# Patient Record
Sex: Female | Born: 1945
Health system: Southern US, Community
[De-identification: ages and names within clinical notes are randomized; demographics above are authoritative.]

## PROBLEM LIST (undated history)

## (undated) DIAGNOSIS — E039 Hypothyroidism, unspecified: Secondary | ICD-10-CM

## (undated) DIAGNOSIS — E079 Disorder of thyroid, unspecified: Secondary | ICD-10-CM

## (undated) DIAGNOSIS — R918 Other nonspecific abnormal finding of lung field: Secondary | ICD-10-CM

## (undated) DIAGNOSIS — R0600 Dyspnea, unspecified: Secondary | ICD-10-CM

## (undated) DIAGNOSIS — R519 Headache, unspecified: Secondary | ICD-10-CM

## (undated) HISTORY — PX: OTHER SURGICAL HISTORY: SHX169

## (undated) HISTORY — PX: WISDOM TOOTH EXTRACTION: SHX21

## (undated) HISTORY — PX: CATARACT EXTRACTION, BILATERAL: SHX1313

## (undated) HISTORY — PX: COLONOSCOPY: SHX174

## (undated) HISTORY — PX: SHOULDER SURGERY: SHX246

---

## 2002-06-22 ENCOUNTER — Encounter: Payer: Self-pay | Admitting: Emergency Medicine

## 2002-06-22 ENCOUNTER — Inpatient Hospital Stay (HOSPITAL_COMMUNITY): Admission: EM | Admit: 2002-06-22 | Discharge: 2002-06-23 | Payer: Self-pay

## 2002-06-22 ENCOUNTER — Encounter (INDEPENDENT_AMBULATORY_CARE_PROVIDER_SITE_OTHER): Payer: Self-pay | Admitting: *Deleted

## 2002-06-23 ENCOUNTER — Encounter: Payer: Self-pay | Admitting: Cardiology

## 2002-12-14 ENCOUNTER — Encounter (INDEPENDENT_AMBULATORY_CARE_PROVIDER_SITE_OTHER): Payer: Self-pay | Admitting: Specialist

## 2002-12-14 ENCOUNTER — Ambulatory Visit (HOSPITAL_COMMUNITY): Admission: RE | Admit: 2002-12-14 | Discharge: 2002-12-14 | Payer: Self-pay | Admitting: Gastroenterology

## 2010-02-22 ENCOUNTER — Encounter
Admission: RE | Admit: 2010-02-22 | Discharge: 2010-02-22 | Payer: Self-pay | Source: Home / Self Care | Attending: Otolaryngology | Admitting: Otolaryngology

## 2010-03-07 LAB — TSH: TSH: 6.051 u[IU]/mL — ABNORMAL HIGH (ref 0.350–4.500)

## 2010-03-07 LAB — CBC
MCV: 95 fL (ref 78.0–100.0)
Platelets: 217 10*3/uL (ref 150–400)
RDW: 13.1 % (ref 11.5–15.5)
WBC: 7.2 10*3/uL (ref 4.0–10.5)

## 2010-03-07 LAB — SURGICAL PCR SCREEN: MRSA, PCR: NEGATIVE

## 2010-03-09 ENCOUNTER — Ambulatory Visit (HOSPITAL_COMMUNITY)
Admission: RE | Admit: 2010-03-09 | Discharge: 2010-03-09 | Payer: Self-pay | Source: Home / Self Care | Attending: Otolaryngology | Admitting: Otolaryngology

## 2010-03-12 NOTE — Op Note (Signed)
[name], [name]              ACCOUNT NO.:  [id number]  MEDICAL RECORD NO.:  [id number]          PATIENT TYPE:  AMB  LOCATION:  SDS                          FACILITY:  [facility]  PHYSICIAN:  Cristle Jared T. Lazarus Salines, M.D. DATE OF BIRTH:  Aug 05, 1945  DATE OF PROCEDURE:  03/09/2010 DATE OF DISCHARGE:  03/09/2010                              OPERATIVE REPORT   PREOPERATIVE DIAGNOSIS:  Left vocal cord lesion.  Bilateral vocal cord polyps.  POSTOPERATIVE DIAGNOSIS:  Complex left vocal cord polyp.  Right vocal cord polyp.  PROCEDURE PERFORMED:  Microlaryngoscopy with bilateral vocal cord stripping.  SURGEON:  Gloris Manchester. Lazarus Salines, MD  ANESTHESIA:  General orotracheal.  BLOOD LOSS:  None.  COMPLICATIONS:  None.  FINDINGS:  A multilobulated polyp of the left vocal cord, more pedunculated than typical and with a yellowish-white opaque area towards the posterior, which was felt to be possible neoplasm on office examination.  The right vocal cord, and more typical fusiform polyp along the length of the vocal cord.  PROCEDURE:  With the patient in the comfortable supine position, general orotracheal anesthesia was induced without difficulty.  At an appropriate level, the table was turned 90 degrees and the patient placed in a slight reverse Trendelenburg.  A rubber tooth guard was applied.  Taking care to protect lips, teeth, and endotracheal tube, the Dedo laryngoscope was introduced, passed into the glottis with good visualization and suspended in the standard fashion.  The findings were as described above.  It was difficult to see below the cords because of the swollen polyps occupying the glottic space.  A 1.5 x 3-inch cottonoid soaked in 1:1000 epinephrine was placed against the cords for intraoperative hemostasis and decongestion.  This was allowed to remain in position for several minutes.  Upon removal, a photograph was taken using the 0 degrees rod bronchoscope.  Beginning on the  left side, the lesion was manipulated with a suction to determine its attachment.  The limiting incisions were made posteriorly behind the visible lesion and just lateral/posterior to the anterior commissure.  Incision was made along the superior surface of the vocal cord with an up-scissors and the lesion was gently dissected using the scissors away from the vocal ligament.  Finally, it was removed in its entirety and sent for frozen section pathologic interpretation.  Given the benign appearance of the lesion, and the plan to remove the right vocal cord and polyp in any event, attention was turned to the right vocal cord.  Once again, the limiting incisions were made anterior and posterior to protect the anterior commissure.  Using a sickle knife and also an up-scissors, the polyp was dissected away from the vocal ligament and passed off as specimen.  A good configuration of the vocal ligament on both sides was observed and the mucosa of the anterior commissure was protected.  There was essentially no bleeding.  At this point, the cottonoid soaked in 1:1000 epinephrine was again placed against the raw surfaces and the laryngoscope was relaxed.  The frozen section returned as a benign inflammatory polyp.  No further attention was felt required.  The laryngoscope was resuspended, the  cottonoid was removed, and a photograph was taken with the rod bronchoscope once again.  The bronchoscope was removed.  The laryngoscope scope was un-suspended and removed under direct vision.  There was no evidence of any additional lesions behind the endotracheal tube.  At this point, the procedure was completed and the patient was returned to Anesthesia.  The tooth guard was removed.  She was awakened and extubated and transferred to recovery in stable condition.  COMMENT:  A 65 year old white female with a long smoking and reflux history with persistent polyps and hoarseness, was noted to  have worsening hoarseness following a recent intubation and on office examination, had a more complex-appearing left vocal cord lesion suspicious for neoplasm, hence the indication for today's procedure. Anticipate a routine postoperative recovery with analgesia, elevation, ice, and voice rest.  Given low anticipated risk of postanesthetic or postsurgical complications, feel an outpatient venue is appropriate.  I will see her back in my office in 2-3 weeks.  I will call them with the pathology report.     Gloris Manchester. Lazarus Salines, M.D.     KTW/MEDQ  D:  03/09/2010  T:  03/10/2010  Job:  [id number]  cc:   [facility]  Electronically Signed by Flo Shanks M.D. on 03/12/2010 05:26:47 PM

## 2010-06-28 ENCOUNTER — Ambulatory Visit: Payer: Medicare Other | Attending: Otolaryngology

## 2010-06-28 DIAGNOSIS — R490 Dysphonia: Secondary | ICD-10-CM | POA: Insufficient documentation

## 2010-06-28 DIAGNOSIS — IMO0001 Reserved for inherently not codable concepts without codable children: Secondary | ICD-10-CM | POA: Insufficient documentation

## 2010-06-29 NOTE — Op Note (Signed)
   NAME:  [name], [name]                        ACCOUNT NO.:  [id number]   MEDICAL RECORD NO.:  [id number]                   PATIENT TYPE:  AMB   LOCATION:  ENDO                                 FACILITY:  [facility]   PHYSICIAN:  Graylin Shiver, M.D.                DATE OF BIRTH:  Jul 03, 1945   DATE OF PROCEDURE:  12/14/2002  DATE OF DISCHARGE:                                 OPERATIVE REPORT   PROCEDURE:  Colonoscopy.   INDICATIONS FOR PROCEDURE:  Screening. Informed consent was obtained after  the explanation of bleeding, infection and perforation.   PREMEDICATIONS:  Fentanyl 75 micrograms IV, Versed 6 mg IV.   DESCRIPTION OF PROCEDURE:  With the patient in the left lateral decubitus  position a rectal examination was performed and no masses were felt. The  Olympus colonoscope was inserted into the rectum and advanced  around the  colon to the cecum.   The cecal landmarks were identified. The cecum and ascending colon were  normal. The transverse colon was normal. The descending colon and sigmoid  were normal. In the distal  rectum there was a small, 3-mm sessile polyp,  removed with cold forceps.  The patient tolerated the procedure well without  complications.   IMPRESSION:  Small distal rectal polyp.   PLAN:  The pathology will be checked.                                               Graylin Shiver, M.D.    SFG/MEDQ  D:  12/14/2002  T:  12/14/2002  Job:  [id number]   cc:   Dellis Anes. Idell Pickles, M.D.  [address]  [place]  [place] [address]  Fax: [phone]

## 2010-07-03 ENCOUNTER — Ambulatory Visit: Payer: Medicare Other

## 2010-07-05 ENCOUNTER — Ambulatory Visit: Payer: Medicare Other

## 2010-07-10 ENCOUNTER — Ambulatory Visit: Payer: Medicare Other

## 2010-07-12 ENCOUNTER — Ambulatory Visit: Payer: Medicare Other

## 2010-07-16 ENCOUNTER — Ambulatory Visit: Payer: Medicare Other | Attending: Otolaryngology

## 2010-07-16 DIAGNOSIS — IMO0001 Reserved for inherently not codable concepts without codable children: Secondary | ICD-10-CM | POA: Insufficient documentation

## 2010-07-16 DIAGNOSIS — R490 Dysphonia: Secondary | ICD-10-CM | POA: Insufficient documentation

## 2010-07-19 ENCOUNTER — Ambulatory Visit: Payer: Medicare Other

## 2010-07-24 ENCOUNTER — Ambulatory Visit: Payer: Medicare Other

## 2010-07-26 ENCOUNTER — Ambulatory Visit: Payer: Medicare Other

## 2010-09-11 ENCOUNTER — Emergency Department (INDEPENDENT_AMBULATORY_CARE_PROVIDER_SITE_OTHER): Payer: Medicare Other

## 2010-09-11 ENCOUNTER — Emergency Department (HOSPITAL_BASED_OUTPATIENT_CLINIC_OR_DEPARTMENT_OTHER)
Admission: EM | Admit: 2010-09-11 | Discharge: 2010-09-11 | Disposition: A | Discharge status: Home / Self Care | Payer: Medicare Other | Attending: Emergency Medicine | Admitting: Emergency Medicine

## 2010-09-11 DIAGNOSIS — W19XXXA Unspecified fall, initial encounter: Secondary | ICD-10-CM | POA: Insufficient documentation

## 2010-09-11 DIAGNOSIS — S52599A Other fractures of lower end of unspecified radius, initial encounter for closed fracture: Secondary | ICD-10-CM

## 2010-09-11 DIAGNOSIS — E079 Disorder of thyroid, unspecified: Secondary | ICD-10-CM | POA: Insufficient documentation

## 2010-09-11 HISTORY — DX: Disorder of thyroid, unspecified: E07.9

## 2010-09-11 NOTE — ED Notes (Signed)
Pt states she fell-landed on right wrist

## 2010-09-11 NOTE — ED Provider Notes (Signed)
History     Chief Complaint  Patient presents with  . Wrist Injury   HPI History of Present Illness   Patient Identification [name] is a 65 y.o. female.  Patient information was obtained from patient. History/Exam limitations: none. Patient presented to the Emergency Department by private vehicle.  Chief Complaint  Wrist Injury   The patient complains of pain in the right wrist pain after a fall. Onset of symptoms was abrupt starting 1 hour ago. There is is not a history of a previous shoulder injury. Patient describes pain as sharp/stabbing. Pain severity at onset was moderate. The pain does not radiate. Pain is aggravated by movement. Pain is alleviated by immobilization. Symptoms associated with pain include none. The patient denies other injuries. Care prior to arrival consisted of nothing, with no relief.  Past Medical History  Diagnosis Date  . Thyroid condition    History reviewed. No pertinent family history. No current facility-administered medications for this encounter.   Current Outpatient Prescriptions  Medication Sig Dispense Refill  . brimonidine (ALPHAGAN P) 0.1 % SOLN Place 1 drop into both eyes 2 (two) times daily.        . Cholecalciferol (VITAMIN D) 1000 UNITS capsule Take 1,000 Units by mouth daily.        Marland Kitchen levothyroxine (SYNTHROID, LEVOTHROID) 75 MCG tablet Take 75 mcg by mouth daily.        . travoprost, benzalkonium, (TRAVATAN) 0.004 % ophthalmic solution Place 1 drop into both eyes at bedtime.         Allergies  Allergen Reactions  . Bee Swelling  . Timolol Nausea Only  . Hydrocodone Itching and Rash   History   Social History  . Marital Status: Married    Spouse Name: N/A    Number of Children: N/A  . Years of Education: N/A   Occupational History  . Not on file.   Social History Main Topics  . Smoking status: Former Games developer  . Smokeless tobacco: Not on file  . Alcohol Use: Yes  . Drug Use:   . Sexually Active:    Other  Topics Concern  . Not on file   Social History Narrative  . No narrative on file   Review of Systems Pertinent items are noted in HPI.   Physical Exam   BP 148/97  Pulse 71  Temp(Src) 98 F (36.7 C) (Oral)  Resp 18  SpO2 98% General appearance: alert, cooperative and appears stated age   ED Course   Studies: wrist x-Khloe Hunkele  Records Reviewed: Nursing notes.  Treatments:   Consultations: Orthopedic Surgery consulted. Treatment options were discussed and plan of care agreed upon.  Disposition: Home to follow up with ortho  Past Medical History  Diagnosis Date  . Thyroid condition     Past Surgical History  Procedure Date  . Vocal cord polyp removal     History reviewed. No pertinent family history.  History  Substance Use Topics  . Smoking status: Former Games developer  . Smokeless tobacco: Not on file  . Alcohol Use: Yes    OB History    Grav Para Term Preterm Abortions TAB SAB Ect Mult Living                  Review of Systems  Physical Exam  BP 148/97  Pulse 71  Temp(Src) 98 F (36.7 C) (Oral)  Resp 18  SpO2 98%  Physical Exam  Nursing note and vitals reviewed. Constitutional: She appears well-nourished.  HENT:  Head: Atraumatic.  Neck: Normal range of motion.  Musculoskeletal: Normal range of motion.       Right wrist: She exhibits tenderness, bony tenderness and swelling. She exhibits no effusion, no crepitus, no deformity and no laceration.       Arms:   ED Course  Procedures  MDM        Hilario Quarry, MD 09/14/10 [phone]

## 2010-10-10 ENCOUNTER — Emergency Department (INDEPENDENT_AMBULATORY_CARE_PROVIDER_SITE_OTHER): Payer: Medicare Other

## 2010-10-10 ENCOUNTER — Emergency Department (HOSPITAL_BASED_OUTPATIENT_CLINIC_OR_DEPARTMENT_OTHER)
Admission: EM | Admit: 2010-10-10 | Discharge: 2010-10-10 | Disposition: A | Discharge status: Home / Self Care | Payer: Medicare Other | Attending: Emergency Medicine | Admitting: Emergency Medicine

## 2010-10-10 ENCOUNTER — Encounter (HOSPITAL_BASED_OUTPATIENT_CLINIC_OR_DEPARTMENT_OTHER): Payer: Self-pay

## 2010-10-10 DIAGNOSIS — R609 Edema, unspecified: Secondary | ICD-10-CM

## 2010-10-10 DIAGNOSIS — W19XXXA Unspecified fall, initial encounter: Secondary | ICD-10-CM

## 2010-10-10 DIAGNOSIS — S82899A Other fracture of unspecified lower leg, initial encounter for closed fracture: Secondary | ICD-10-CM | POA: Insufficient documentation

## 2010-10-10 DIAGNOSIS — M25579 Pain in unspecified ankle and joints of unspecified foot: Secondary | ICD-10-CM

## 2010-10-10 DIAGNOSIS — S82409A Unspecified fracture of shaft of unspecified fibula, initial encounter for closed fracture: Secondary | ICD-10-CM

## 2010-10-10 DIAGNOSIS — Y92009 Unspecified place in unspecified non-institutional (private) residence as the place of occurrence of the external cause: Secondary | ICD-10-CM | POA: Insufficient documentation

## 2010-10-10 NOTE — ED Notes (Signed)
Slipped on deck this am and pain right ankle with swelling

## 2010-10-10 NOTE — ED Provider Notes (Signed)
History     CSN: [id number] Arrival date & time: 10/10/2010 10:46 AM  Chief Complaint  Patient presents with  . Ankle Pain   Patient is a 65 y.o. female presenting with ankle pain.  Ankle Pain  The incident occurred 3 to 5 hours ago. The incident occurred at home. The injury mechanism was a fall. The pain is present in the right ankle. The quality of the pain is described as aching and throbbing. The pain is at a severity of 5/10. The pain is moderate. The pain has been constant since onset. Associated symptoms include inability to bear weight, loss of motion and tingling. She reports no foreign bodies present. The symptoms are aggravated by nothing. She has tried elevation and ice for the symptoms. The treatment provided mild relief.    Past Medical History  Diagnosis Date  . Thyroid condition   . Glaucoma     Past Surgical History  Procedure Date  . Vocal cord polyp removal   . Shoulder surgery     No family history on file.  History  Substance Use Topics  . Smoking status: Former Games developer  . Smokeless tobacco: Not on file  . Alcohol Use: 0.6 oz/week    1 Glasses of wine per week     per day    OB History    Grav Para Term Preterm Abortions TAB SAB Ect Mult Living                  Review of Systems  Neurological: Positive for tingling.  All other systems reviewed and are negative.    Physical Exam  BP 140/55  Pulse 68  Temp(Src) 98.3 F (36.8 C) (Oral)  Resp 18  SpO2 97%  Physical Exam  Nursing note and vitals reviewed. Constitutional: She is oriented to person, place, and time. She appears well-developed and well-nourished.  HENT:  Head: Normocephalic.  Eyes: Pupils are equal, round, and reactive to light.  Neck: Normal range of motion.  Musculoskeletal: She exhibits edema and tenderness.  Neurological: She is alert and oriented to person, place, and time.  Skin: Skin is warm.  Psychiatric: She has a normal mood and affect.    ED Course    Procedures  MDM   Crutches,  Cam walker,       Langston Masker, [place] 10/10/10 1227

## 2010-10-10 NOTE — ED Provider Notes (Signed)
Evaluation and management procedures were performed by the mid-level provider (PA/NP/CNM) under my supervision/collaboration. I was present and available during the ED course. GHIM,MICHAEL Y.   Michael Y. Ghim, MD 10/10/10 1248 

## 2012-09-09 ENCOUNTER — Emergency Department (HOSPITAL_BASED_OUTPATIENT_CLINIC_OR_DEPARTMENT_OTHER): Payer: Medicare Other

## 2012-09-09 ENCOUNTER — Emergency Department (HOSPITAL_BASED_OUTPATIENT_CLINIC_OR_DEPARTMENT_OTHER)
Admission: EM | Admit: 2012-09-09 | Discharge: 2012-09-09 | Disposition: A | Discharge status: Home / Self Care | Payer: Medicare Other | Attending: Emergency Medicine | Admitting: Emergency Medicine

## 2012-09-09 ENCOUNTER — Encounter (HOSPITAL_BASED_OUTPATIENT_CLINIC_OR_DEPARTMENT_OTHER): Payer: Self-pay | Admitting: *Deleted

## 2012-09-09 DIAGNOSIS — Y9389 Activity, other specified: Secondary | ICD-10-CM | POA: Insufficient documentation

## 2012-09-09 DIAGNOSIS — Z87891 Personal history of nicotine dependence: Secondary | ICD-10-CM | POA: Insufficient documentation

## 2012-09-09 DIAGNOSIS — S93402A Sprain of unspecified ligament of left ankle, initial encounter: Secondary | ICD-10-CM

## 2012-09-09 DIAGNOSIS — Y929 Unspecified place or not applicable: Secondary | ICD-10-CM | POA: Insufficient documentation

## 2012-09-09 DIAGNOSIS — H40019 Open angle with borderline findings, low risk, unspecified eye: Secondary | ICD-10-CM | POA: Insufficient documentation

## 2012-09-09 DIAGNOSIS — X500XXA Overexertion from strenuous movement or load, initial encounter: Secondary | ICD-10-CM | POA: Insufficient documentation

## 2012-09-09 DIAGNOSIS — M25473 Effusion, unspecified ankle: Secondary | ICD-10-CM | POA: Insufficient documentation

## 2012-09-09 DIAGNOSIS — M2560 Stiffness of unspecified joint, not elsewhere classified: Secondary | ICD-10-CM | POA: Insufficient documentation

## 2012-09-09 DIAGNOSIS — E079 Disorder of thyroid, unspecified: Secondary | ICD-10-CM | POA: Insufficient documentation

## 2012-09-09 DIAGNOSIS — M25476 Effusion, unspecified foot: Secondary | ICD-10-CM | POA: Insufficient documentation

## 2012-09-09 DIAGNOSIS — S93409A Sprain of unspecified ligament of unspecified ankle, initial encounter: Secondary | ICD-10-CM | POA: Insufficient documentation

## 2012-09-09 DIAGNOSIS — Z79899 Other long term (current) drug therapy: Secondary | ICD-10-CM | POA: Insufficient documentation

## 2012-09-09 NOTE — ED Notes (Signed)
Pt c/o left ankle injury x 2 days ago pain and swelling started yesterday

## 2012-09-09 NOTE — ED Provider Notes (Addendum)
CSN: [id number]     Arrival date & time 09/09/12  1743 History     First MD Initiated Contact with Patient 09/09/12 1759     Chief Complaint  Patient presents with  . Ankle Pain   (Consider location/radiation/quality/duration/timing/severity/associated sxs/prior Treatment) Patient is a 67 y.o. female presenting with ankle pain. The history is provided by the patient.  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 she remembers twisting and on Monday but just kept going and then yesterday noticed pain and swelling in the left ankle which got worse today Ankle location:  L ankle Pain details:    Quality:  Aching and sharp   Radiates to:  Does not radiate   Severity:  Moderate   Onset quality:  Gradual   Duration:  2 days   Timing:  Constant   Progression:  Worsening Chronicity:  New Prior injury to area:  No Relieved by:  Ice, rest and elevation Worsened by:  Bearing weight Associated symptoms: stiffness and swelling   Associated symptoms: no decreased ROM and no tingling     Past Medical History  Diagnosis Date  . Thyroid condition   . Glaucoma    Past Surgical History  Procedure Laterality Date  . Vocal cord polyp removal    . Shoulder surgery     History reviewed. No pertinent family history. History  Substance Use Topics  . Smoking status: Former Games developer  . Smokeless tobacco: Not on file  . Alcohol Use: 0.6 oz/week    1 Glasses of wine per week     Comment: per day   OB History   Grav Para Term Preterm Abortions TAB SAB Ect Mult Living                 Review of Systems  Musculoskeletal: Positive for stiffness.  All other systems reviewed and are negative.    Allergies  Nutritional supplements; Timolol; and Hydrocodone  Home Medications   Current Outpatient Rx  Name  Route  Sig  Dispense  Refill  . brimonidine (ALPHAGAN P) 0.1 % SOLN   Both Eyes   Place 1 drop into  both eyes 2 (two) times daily.           . Cholecalciferol (VITAMIN D) 1000 UNITS capsule   Oral   Take 1,000 Units by mouth daily.           Marland Kitchen levothyroxine (SYNTHROID, LEVOTHROID) 75 MCG tablet   Oral   Take 75 mcg by mouth daily.           . travoprost, benzalkonium, (TRAVATAN) 0.004 % ophthalmic solution   Both Eyes   Place 1 drop into both eyes at bedtime.            BP 141/77  Pulse 86  Temp(Src) 98.6 F (37 C) (Oral)  Resp 16  Ht 4' 11.5" (1.511 m)  Wt 125 lb (56.7 kg)  BMI 24.83 kg/m2  SpO2 100% Physical Exam  Nursing note and vitals reviewed. Constitutional: She is oriented to person, place, and time. She appears well-developed and well-nourished. No distress.  HENT:  Head: Normocephalic and atraumatic.  Mouth/Throat: Oropharynx is clear and moist.  Eyes: Conjunctivae and EOM are normal. Pupils are equal, round, and reactive to light.  Neck: Normal range of motion. Neck supple.  Cardiovascular: Normal rate and intact distal pulses.   Pulmonary/Chest: Effort normal and breath sounds normal.  Abdominal: Soft.  Musculoskeletal: Normal range of motion. She exhibits no edema and no tenderness.       Left ankle: She exhibits swelling. She exhibits normal range of motion, no ecchymosis and no deformity. Tenderness. Lateral malleolus tenderness found. No head of 5th metatarsal and no proximal fibula tenderness found.  Neurological: She is alert and oriented to person, place, and time.  Skin: Skin is warm and dry. No rash noted. No erythema.  Psychiatric: She has a normal mood and affect. Her behavior is normal.    ED Course   Procedures (including critical care time)  Labs Reviewed - No data to display Dg Ankle Complete Left  09/09/2012   *RADIOLOGY REPORT*  Clinical Data: Pain and swelling.  LEFT ANKLE COMPLETE - 3+ VIEW  Comparison: None.  Findings: Ankle mortise intact.  Small calcaneal spur at the plantar aponeurosis. Negative for fracture, dislocation, or  other acute abnormality.  Normal alignment and mineralization. No significant degenerative change.  Regional soft tissues unremarkable.  IMPRESSION:  Negative   Original Report Authenticated By: D. Andria Rhein, MD   1. Ankle sprain, left, initial encounter     MDM   Patient with symptoms most suggestive of ankle sprain. Plain film pending to rule out fracture.  Plain film neg.  Will treat with ankle sprain.  Gwyneth Sprout, MD 09/09/12 1610  Gwyneth Sprout, MD 09/09/12 9604  Gwyneth Sprout, MD 09/09/12 5409

## 2012-09-09 NOTE — ED Notes (Signed)
Patient transported to X-ray 

## 2012-09-09 NOTE — ED Notes (Signed)
MD at bedside. 

## 2013-03-05 ENCOUNTER — Encounter (HOSPITAL_BASED_OUTPATIENT_CLINIC_OR_DEPARTMENT_OTHER): Payer: Self-pay | Admitting: Emergency Medicine

## 2013-03-05 ENCOUNTER — Emergency Department (HOSPITAL_BASED_OUTPATIENT_CLINIC_OR_DEPARTMENT_OTHER)
Admission: EM | Admit: 2013-03-05 | Discharge: 2013-03-05 | Disposition: A | Discharge status: Home / Self Care | Payer: Medicare Other | Attending: Emergency Medicine | Admitting: Emergency Medicine

## 2013-03-05 ENCOUNTER — Emergency Department (HOSPITAL_BASED_OUTPATIENT_CLINIC_OR_DEPARTMENT_OTHER): Payer: Medicare Other

## 2013-03-05 DIAGNOSIS — M25519 Pain in unspecified shoulder: Secondary | ICD-10-CM | POA: Insufficient documentation

## 2013-03-05 DIAGNOSIS — Z79899 Other long term (current) drug therapy: Secondary | ICD-10-CM | POA: Insufficient documentation

## 2013-03-05 DIAGNOSIS — M898X1 Other specified disorders of bone, shoulder: Secondary | ICD-10-CM

## 2013-03-05 DIAGNOSIS — E079 Disorder of thyroid, unspecified: Secondary | ICD-10-CM | POA: Insufficient documentation

## 2013-03-05 DIAGNOSIS — Z8669 Personal history of other diseases of the nervous system and sense organs: Secondary | ICD-10-CM | POA: Insufficient documentation

## 2013-03-05 DIAGNOSIS — Z87891 Personal history of nicotine dependence: Secondary | ICD-10-CM | POA: Insufficient documentation

## 2013-03-05 DIAGNOSIS — R079 Chest pain, unspecified: Secondary | ICD-10-CM | POA: Insufficient documentation

## 2013-03-05 LAB — TROPONIN I

## 2013-03-05 LAB — CREATININE, SERUM
Creatinine, Ser: 0.7 mg/dL (ref 0.50–1.10)
GFR calc non Af Amer: 87 mL/min — ABNORMAL LOW (ref >90)

## 2013-03-05 LAB — D-DIMER, QUANTITATIVE (NOT AT ARMC): D DIMER QUANT: 0.68 ug{FEU}/mL — AB (ref 0.00–0.48)

## 2013-03-05 MED ORDER — IOHEXOL 350 MG/ML SOLN
80.0000 mL | Freq: Once | INTRAVENOUS | Status: AC | PRN
  Administered 2013-03-05: 80 mL via INTRAVENOUS

## 2013-03-05 NOTE — Discharge Instructions (Signed)
Musculoskeletal Pain Musculoskeletal pain is muscle and boney aches and pains. These pains can occur in any part of the body. Your caregiver may treat you without knowing the cause of the pain. They may treat you if blood or urine tests, X-rays, and other tests were normal.  CAUSES There is often not a definite cause or reason for these pains. These pains may be caused by a type of germ (virus). The discomfort may also come from overuse. Overuse includes working out too hard when your body is not fit. Boney aches also come from weather changes. Bone is sensitive to atmospheric pressure changes. HOME CARE INSTRUCTIONS   Ask when your test results will be ready. Make sure you get your test results.  Only take over-the-counter or prescription medicines for pain, discomfort, or fever as directed by your caregiver. If you were given medications for your condition, do not drive, operate machinery or power tools, or sign legal documents for 24 hours. Do not drink alcohol. Do not take sleeping pills or other medications that may interfere with treatment.  Continue all activities unless the activities cause more pain. When the pain lessens, slowly resume normal activities. Gradually increase the intensity and duration of the activities or exercise.  During periods of severe pain, bed rest may be helpful. Lay or sit in any position that is comfortable.  Putting ice on the injured area.  Put ice in a bag.  Place a towel between your skin and the bag.  Leave the ice on for 15 to 20 minutes, 3 to 4 times a day.  Follow up with your caregiver for continued problems and no reason can be found for the pain. If the pain becomes worse or does not go away, it may be necessary to repeat tests or do additional testing. Your caregiver may need to look further for a possible cause. SEEK IMMEDIATE MEDICAL CARE IF:  You have pain that is getting worse and is not relieved by medications.  You develop chest pain  that is associated with shortness or breath, sweating, feeling sick to your stomach (nauseous), or throw up (vomit).  Your pain becomes localized to the abdomen.  You develop any new symptoms that seem different or that concern you. MAKE SURE YOU:   Understand these instructions.  Will watch your condition.  Will get help right away if you are not doing well or get worse. Document Released: 01/28/2005 Document Revised: 04/22/2011 Document Reviewed: 10/02/2012 [facility] Patient Information 2014 [place].

## 2013-03-05 NOTE — ED Notes (Signed)
C/o left upper back pain 2 days ago-was relieved by icy hot pads-today she had a sharp pain to same area this am-lasted 15-20 sec-states pain worse with deep breath-denies cough, cold s/s-denies activity prior to pain start

## 2013-03-05 NOTE — ED Provider Notes (Signed)
CSN: [id number]     Arrival date & time 03/05/13  1049 History   First MD Initiated Contact with Patient 03/05/13 1104     Chief Complaint  Patient presents with  . Back Pain   (Consider location/radiation/quality/duration/timing/severity/associated sxs/prior Treatment) HPI  This is a 68 year old female who presents with left chest and back pain. Patient reports onset of symptoms earlier today. She reports sharp nonradiating pain. It is mostly located over her left scapula. She states he's never had pain like this before. Reports the pain is 8/10. It is worse with a deep breath. She denies any URI symptoms including shortness of breath, cough. She denies any history of blood clots. She denies any injury. Patient states that she had a similar pain several days ago but it was not this bad. It was improved with icy hot pads.  Past Medical History  Diagnosis Date  . Thyroid condition   . Glaucoma    Past Surgical History  Procedure Laterality Date  . Vocal cord polyp removal    . Shoulder surgery     No family history on file. History  Substance Use Topics  . Smoking status: Former Research scientist (life sciences)  . Smokeless tobacco: Not on file  . Alcohol Use: 0.0 oz/week     Comment: per day   OB History   Grav Para Term Preterm Abortions TAB SAB Ect Mult Living                 Review of Systems  Constitutional: Negative for fever.  Respiratory: Negative for cough, chest tightness and shortness of breath.   Cardiovascular: Positive for chest pain. Negative for palpitations and leg swelling.  Gastrointestinal: Negative for nausea, vomiting and abdominal pain.  Genitourinary: Negative for dysuria.  Musculoskeletal: Positive for back pain.  Skin: Negative for wound.  Neurological: Negative for headaches.  Psychiatric/Behavioral: Negative for confusion.  All other systems reviewed and are negative.    Allergies  Nutritional supplements; Timolol; and Hydrocodone  Home Medications   Current  Outpatient Rx  Name  Route  Sig  Dispense  Refill  . dorzolamide (TRUSOPT) 2 % ophthalmic solution      1 drop 2 (two) times daily.         . brimonidine (ALPHAGAN P) 0.1 % SOLN   Both Eyes   Place 1 drop into both eyes 2 (two) times daily.           . Cholecalciferol (VITAMIN D) 1000 UNITS capsule   Oral   Take 1,000 Units by mouth daily.           Marland Kitchen levothyroxine (SYNTHROID, LEVOTHROID) 75 MCG tablet   Oral   Take 75 mcg by mouth daily.           . travoprost, benzalkonium, (TRAVATAN) 0.004 % ophthalmic solution   Both Eyes   Place 1 drop into both eyes at bedtime.            BP 149/56  Pulse 69  Temp(Src) 98 F (36.7 C) (Oral)  Resp 20  Ht 4' 11.5" (1.511 m)  Wt 124 lb (56.246 kg)  BMI 24.64 kg/m2  SpO2 97% Physical Exam  Nursing note and vitals reviewed. Constitutional: She is oriented to person, place, and time. She appears well-developed and well-nourished. No distress.  HENT:  Head: Normocephalic and atraumatic.  Eyes: Pupils are equal, round, and reactive to light.  Neck: Neck supple.  Cardiovascular: Normal rate, regular rhythm and normal heart sounds.   Pulmonary/Chest: Effort  normal. No respiratory distress. She has no wheezes. She exhibits no tenderness.  Abdominal: Soft. There is no tenderness.  Neurological: She is alert and oriented to person, place, and time.  Skin: Skin is warm and dry.  Psychiatric: She has a normal mood and affect.    ED Course  Procedures (including critical care time) Labs Review Labs Reviewed  D-DIMER, QUANTITATIVE - Abnormal; Notable for the following:    D-Dimer, Quant 0.68 (*)    All other components within normal limits  CREATININE, SERUM - Abnormal; Notable for the following:    GFR calc non Af Amer 87 (*)    All other components within normal limits  TROPONIN I   Imaging Review Dg Chest 2 View  03/05/2013   CLINICAL DATA:  Back pain.  EXAM: CHEST  2 VIEW  COMPARISON:  03/06/2010  FINDINGS: The  cardiomediastinal silhouette is within normal limits. The lungs are slightly less well inflated than on the prior study. Slightly prominent interstitial markings in the lower lungs are similar to the prior study. There is no evidence of airspace consolidation, edema, pleural effusion, or pneumothorax. No acute osseous abnormality is seen.  IMPRESSION: No evidence of acute cardiopulmonary abnormality.   Electronically Signed   By: Logan Bores   On: 03/05/2013 12:31   Ct Angio Chest W/cm &/or Wo Cm  03/05/2013   CLINICAL DATA:  Elevated D-dimer, back pain, shortness of breath  EXAM: CT ANGIOGRAPHY CHEST WITH CONTRAST  TECHNIQUE: Multidetector CT imaging of the chest was performed using the standard protocol during bolus administration of intravenous contrast. Multiplanar CT image reconstructions including MIPs were obtained to evaluate the vascular anatomy.  CONTRAST:  83mL OMNIPAQUE IOHEXOL 350 MG/ML SOLN  COMPARISON:  03/05/2013 chest x-ray  FINDINGS: No significant filling defect or pulmonary embolus demonstrated by CTA. Pulmonary arteries appear patent. Atherosclerotic changes of the aorta. Negative for aneurysm or dissection. No adenopathy. Normal heart size. No pericardial or pleural effusion. Small hiatal hernia noted.  Included upper abdomen demonstrates no acute process.  Lung windows demonstrate moderate to severe predominantly centrilobular emphysema throughout both lungs, all lobes. Trachea and central airways are patent. No pneumothorax. Posterior right lower lobe dependent ground-glass opacity evident, nonspecific but could represent mild nonspecific alveolitis. There is also right base atelectasis. Early pneumonia not entirely excluded.  No acute osseous finding.  Review of the MIP images confirms the above findings.  IMPRESSION: Negative for significant acute pulmonary embolus  Moderate to severe diffuse pulmonary emphysema predominant centrilobular type  Dependent right base ground-glass opacity,  nonspecific for alveolitis versus mild developing pneumonia.  Additional right base atelectasis   Electronically Signed   By: Daryll Brod M.D.   On: 03/05/2013 13:53    EKG Interpretation    Date/Time:  Friday March 05 2013 11:43:41 EST Ventricular Rate:  58 PR Interval:  154 QRS Duration: 74 QT Interval:  444 QTC Calculation: 435 R Axis:   55 Text Interpretation:  Sinus bradycardia with Premature supraventricular complexes Anteroseptal infarct , age undetermined Abnormal ECG Confirmed by Sun Kihn  MD, Vernella Niznik ([id number]) on 03/05/2013 1:10:16 PM            MDM   1. Pain of left scapula      Patient presents with acute onset of left chest and back pain. She is nontoxic-appearing on exam. EKG is reassuring. Pain is somewhat pleuritic and is not reproducible on exam. D-dimer was sent. D-dimer 0.68 which is mildly elevated and right at the age adjusted cut  off for positivity. Serum creatinine was checked patient was sent for CTA to rule out PE. Troponin is negative. Patient is refusing pain medications at this time.  Workup negative. Patient does not want any medications at home and states that she just wanted to make sure nothing was wrong.  After history, exam, and medical workup I feel the patient has been appropriately medically screened and is safe for discharge home. Pertinent diagnoses were discussed with the patient. Patient was given return precautions.     Merryl Hacker, MD 03/05/13 [phone]

## 2015-04-13 DIAGNOSIS — L719 Rosacea, unspecified: Secondary | ICD-10-CM | POA: Diagnosis not present

## 2015-04-13 DIAGNOSIS — Z23 Encounter for immunization: Secondary | ICD-10-CM | POA: Diagnosis not present

## 2015-04-13 DIAGNOSIS — D1800 Hemangioma unspecified site: Secondary | ICD-10-CM | POA: Diagnosis not present

## 2015-04-13 DIAGNOSIS — D225 Melanocytic nevi of trunk: Secondary | ICD-10-CM | POA: Diagnosis not present

## 2015-04-13 DIAGNOSIS — L821 Other seborrheic keratosis: Secondary | ICD-10-CM | POA: Diagnosis not present

## 2015-04-20 DIAGNOSIS — N905 Atrophy of vulva: Secondary | ICD-10-CM | POA: Diagnosis not present

## 2015-04-20 DIAGNOSIS — N952 Postmenopausal atrophic vaginitis: Secondary | ICD-10-CM | POA: Diagnosis not present

## 2015-05-04 DIAGNOSIS — Z136 Encounter for screening for cardiovascular disorders: Secondary | ICD-10-CM | POA: Diagnosis not present

## 2015-05-04 DIAGNOSIS — M8580 Other specified disorders of bone density and structure, unspecified site: Secondary | ICD-10-CM | POA: Diagnosis not present

## 2015-05-04 DIAGNOSIS — E039 Hypothyroidism, unspecified: Secondary | ICD-10-CM | POA: Diagnosis not present

## 2015-05-04 DIAGNOSIS — Z0000 Encounter for general adult medical examination without abnormal findings: Secondary | ICD-10-CM | POA: Diagnosis not present

## 2015-05-04 DIAGNOSIS — E559 Vitamin D deficiency, unspecified: Secondary | ICD-10-CM | POA: Diagnosis not present

## 2015-06-05 DIAGNOSIS — Z1231 Encounter for screening mammogram for malignant neoplasm of breast: Secondary | ICD-10-CM | POA: Diagnosis not present

## 2015-07-04 DIAGNOSIS — H401111 Primary open-angle glaucoma, right eye, mild stage: Secondary | ICD-10-CM | POA: Diagnosis not present

## 2015-07-04 DIAGNOSIS — H401122 Primary open-angle glaucoma, left eye, moderate stage: Secondary | ICD-10-CM | POA: Diagnosis not present

## 2015-11-06 DIAGNOSIS — Z23 Encounter for immunization: Secondary | ICD-10-CM | POA: Diagnosis not present

## 2015-12-26 DIAGNOSIS — H401112 Primary open-angle glaucoma, right eye, moderate stage: Secondary | ICD-10-CM | POA: Diagnosis not present

## 2015-12-26 DIAGNOSIS — H401122 Primary open-angle glaucoma, left eye, moderate stage: Secondary | ICD-10-CM | POA: Diagnosis not present

## 2015-12-26 DIAGNOSIS — H25013 Cortical age-related cataract, bilateral: Secondary | ICD-10-CM | POA: Diagnosis not present

## 2015-12-26 DIAGNOSIS — H2513 Age-related nuclear cataract, bilateral: Secondary | ICD-10-CM | POA: Diagnosis not present

## 2016-01-09 DIAGNOSIS — J069 Acute upper respiratory infection, unspecified: Secondary | ICD-10-CM | POA: Diagnosis not present

## 2016-01-09 DIAGNOSIS — J209 Acute bronchitis, unspecified: Secondary | ICD-10-CM | POA: Diagnosis not present

## 2016-04-24 DIAGNOSIS — Z6826 Body mass index (BMI) 26.0-26.9, adult: Secondary | ICD-10-CM | POA: Diagnosis not present

## 2016-04-24 DIAGNOSIS — Z01419 Encounter for gynecological examination (general) (routine) without abnormal findings: Secondary | ICD-10-CM | POA: Diagnosis not present

## 2016-05-16 DIAGNOSIS — Z1211 Encounter for screening for malignant neoplasm of colon: Secondary | ICD-10-CM | POA: Diagnosis not present

## 2016-05-27 DIAGNOSIS — E039 Hypothyroidism, unspecified: Secondary | ICD-10-CM | POA: Diagnosis not present

## 2016-05-27 DIAGNOSIS — Z23 Encounter for immunization: Secondary | ICD-10-CM | POA: Diagnosis not present

## 2016-05-27 DIAGNOSIS — Z0000 Encounter for general adult medical examination without abnormal findings: Secondary | ICD-10-CM | POA: Diagnosis not present

## 2016-05-27 DIAGNOSIS — E559 Vitamin D deficiency, unspecified: Secondary | ICD-10-CM | POA: Diagnosis not present

## 2016-06-05 DIAGNOSIS — Z1231 Encounter for screening mammogram for malignant neoplasm of breast: Secondary | ICD-10-CM | POA: Diagnosis not present

## 2016-07-01 DIAGNOSIS — H401131 Primary open-angle glaucoma, bilateral, mild stage: Secondary | ICD-10-CM | POA: Diagnosis not present

## 2016-07-01 DIAGNOSIS — H2513 Age-related nuclear cataract, bilateral: Secondary | ICD-10-CM | POA: Diagnosis not present

## 2016-10-24 DIAGNOSIS — Z23 Encounter for immunization: Secondary | ICD-10-CM | POA: Diagnosis not present

## 2016-10-24 DIAGNOSIS — E039 Hypothyroidism, unspecified: Secondary | ICD-10-CM | POA: Diagnosis not present

## 2016-12-31 DIAGNOSIS — H401131 Primary open-angle glaucoma, bilateral, mild stage: Secondary | ICD-10-CM | POA: Diagnosis not present

## 2016-12-31 DIAGNOSIS — H25013 Cortical age-related cataract, bilateral: Secondary | ICD-10-CM | POA: Diagnosis not present

## 2016-12-31 DIAGNOSIS — H43813 Vitreous degeneration, bilateral: Secondary | ICD-10-CM | POA: Diagnosis not present

## 2017-04-29 DIAGNOSIS — N905 Atrophy of vulva: Secondary | ICD-10-CM | POA: Diagnosis not present

## 2017-04-30 DIAGNOSIS — H401131 Primary open-angle glaucoma, bilateral, mild stage: Secondary | ICD-10-CM | POA: Diagnosis not present

## 2017-06-06 DIAGNOSIS — Z1231 Encounter for screening mammogram for malignant neoplasm of breast: Secondary | ICD-10-CM | POA: Diagnosis not present

## 2017-06-27 DIAGNOSIS — Z0189 Encounter for other specified special examinations: Secondary | ICD-10-CM | POA: Diagnosis not present

## 2017-06-27 DIAGNOSIS — E039 Hypothyroidism, unspecified: Secondary | ICD-10-CM | POA: Diagnosis not present

## 2017-06-27 DIAGNOSIS — Z131 Encounter for screening for diabetes mellitus: Secondary | ICD-10-CM | POA: Diagnosis not present

## 2017-06-27 DIAGNOSIS — E559 Vitamin D deficiency, unspecified: Secondary | ICD-10-CM | POA: Diagnosis not present

## 2017-06-27 DIAGNOSIS — Z0000 Encounter for general adult medical examination without abnormal findings: Secondary | ICD-10-CM | POA: Diagnosis not present

## 2017-10-08 DIAGNOSIS — M545 Low back pain: Secondary | ICD-10-CM | POA: Diagnosis not present

## 2017-10-27 DIAGNOSIS — Z23 Encounter for immunization: Secondary | ICD-10-CM | POA: Diagnosis not present

## 2017-10-27 DIAGNOSIS — Z6824 Body mass index (BMI) 24.0-24.9, adult: Secondary | ICD-10-CM | POA: Diagnosis not present

## 2017-10-27 DIAGNOSIS — J209 Acute bronchitis, unspecified: Secondary | ICD-10-CM | POA: Diagnosis not present

## 2017-10-27 DIAGNOSIS — Z87891 Personal history of nicotine dependence: Secondary | ICD-10-CM | POA: Diagnosis not present

## 2017-11-04 DIAGNOSIS — H25013 Cortical age-related cataract, bilateral: Secondary | ICD-10-CM | POA: Diagnosis not present

## 2017-11-04 DIAGNOSIS — H2513 Age-related nuclear cataract, bilateral: Secondary | ICD-10-CM | POA: Diagnosis not present

## 2017-11-04 DIAGNOSIS — H43813 Vitreous degeneration, bilateral: Secondary | ICD-10-CM | POA: Diagnosis not present

## 2017-11-04 DIAGNOSIS — H401131 Primary open-angle glaucoma, bilateral, mild stage: Secondary | ICD-10-CM | POA: Diagnosis not present

## 2018-07-10 DIAGNOSIS — H401131 Primary open-angle glaucoma, bilateral, mild stage: Secondary | ICD-10-CM | POA: Diagnosis not present

## 2018-07-10 DIAGNOSIS — H25013 Cortical age-related cataract, bilateral: Secondary | ICD-10-CM | POA: Diagnosis not present

## 2018-07-10 DIAGNOSIS — H2513 Age-related nuclear cataract, bilateral: Secondary | ICD-10-CM | POA: Diagnosis not present

## 2018-07-14 DIAGNOSIS — Z1231 Encounter for screening mammogram for malignant neoplasm of breast: Secondary | ICD-10-CM | POA: Diagnosis not present

## 2018-07-21 DIAGNOSIS — Z01419 Encounter for gynecological examination (general) (routine) without abnormal findings: Secondary | ICD-10-CM | POA: Diagnosis not present

## 2018-07-21 DIAGNOSIS — Z6823 Body mass index (BMI) 23.0-23.9, adult: Secondary | ICD-10-CM | POA: Diagnosis not present

## 2018-07-23 DIAGNOSIS — Z0000 Encounter for general adult medical examination without abnormal findings: Secondary | ICD-10-CM | POA: Diagnosis not present

## 2018-07-23 DIAGNOSIS — E559 Vitamin D deficiency, unspecified: Secondary | ICD-10-CM | POA: Diagnosis not present

## 2018-07-23 DIAGNOSIS — Z131 Encounter for screening for diabetes mellitus: Secondary | ICD-10-CM | POA: Diagnosis not present

## 2018-07-23 DIAGNOSIS — E039 Hypothyroidism, unspecified: Secondary | ICD-10-CM | POA: Diagnosis not present

## 2018-07-30 DIAGNOSIS — Z0000 Encounter for general adult medical examination without abnormal findings: Secondary | ICD-10-CM | POA: Diagnosis not present

## 2018-07-30 DIAGNOSIS — E039 Hypothyroidism, unspecified: Secondary | ICD-10-CM | POA: Diagnosis not present

## 2018-07-30 DIAGNOSIS — E559 Vitamin D deficiency, unspecified: Secondary | ICD-10-CM | POA: Diagnosis not present

## 2018-07-30 DIAGNOSIS — M8580 Other specified disorders of bone density and structure, unspecified site: Secondary | ICD-10-CM | POA: Diagnosis not present

## 2018-08-06 DIAGNOSIS — E039 Hypothyroidism, unspecified: Secondary | ICD-10-CM | POA: Diagnosis not present

## 2018-08-27 DIAGNOSIS — H25812 Combined forms of age-related cataract, left eye: Secondary | ICD-10-CM | POA: Diagnosis not present

## 2018-08-27 DIAGNOSIS — H2512 Age-related nuclear cataract, left eye: Secondary | ICD-10-CM | POA: Diagnosis not present

## 2018-08-27 DIAGNOSIS — H52222 Regular astigmatism, left eye: Secondary | ICD-10-CM | POA: Diagnosis not present

## 2018-08-27 DIAGNOSIS — H25012 Cortical age-related cataract, left eye: Secondary | ICD-10-CM | POA: Diagnosis not present

## 2018-09-17 DIAGNOSIS — R748 Abnormal levels of other serum enzymes: Secondary | ICD-10-CM | POA: Diagnosis not present

## 2018-10-22 DIAGNOSIS — H25011 Cortical age-related cataract, right eye: Secondary | ICD-10-CM | POA: Diagnosis not present

## 2018-10-22 DIAGNOSIS — H25811 Combined forms of age-related cataract, right eye: Secondary | ICD-10-CM | POA: Diagnosis not present

## 2018-10-22 DIAGNOSIS — H2511 Age-related nuclear cataract, right eye: Secondary | ICD-10-CM | POA: Diagnosis not present

## 2018-12-08 DIAGNOSIS — Z7289 Other problems related to lifestyle: Secondary | ICD-10-CM | POA: Diagnosis not present

## 2018-12-08 DIAGNOSIS — J381 Polyp of vocal cord and larynx: Secondary | ICD-10-CM | POA: Diagnosis not present

## 2018-12-08 DIAGNOSIS — Z87891 Personal history of nicotine dependence: Secondary | ICD-10-CM | POA: Diagnosis not present

## 2018-12-17 DIAGNOSIS — L821 Other seborrheic keratosis: Secondary | ICD-10-CM | POA: Diagnosis not present

## 2018-12-17 DIAGNOSIS — D1800 Hemangioma unspecified site: Secondary | ICD-10-CM | POA: Diagnosis not present

## 2018-12-17 DIAGNOSIS — D225 Melanocytic nevi of trunk: Secondary | ICD-10-CM | POA: Diagnosis not present

## 2018-12-17 DIAGNOSIS — Z23 Encounter for immunization: Secondary | ICD-10-CM | POA: Diagnosis not present

## 2019-03-08 ENCOUNTER — Ambulatory Visit: Payer: Medicare Other | Attending: Internal Medicine

## 2019-03-08 DIAGNOSIS — Z23 Encounter for immunization: Secondary | ICD-10-CM | POA: Insufficient documentation

## 2019-03-08 NOTE — Progress Notes (Signed)
   Covid-19 Vaccination Clinic  Name:  [name]    MRN: [id number] DOB: [birth year]  03/08/2019  [name] was observed post Covid-19 immunization for 30 minutes based on pre-vaccination screening without incidence. She was provided with Vaccine Information Sheet and instruction to access the V-Safe system.   [name] was instructed to call 911 with any severe reactions post vaccine: Marland Kitchen Difficulty breathing  . Swelling of your face and throat  . A fast heartbeat  . A bad rash all over your body  . Dizziness and weakness    Immunizations Administered    Name Date Dose VIS Date Route   Pfizer COVID-19 Vaccine 03/08/2019 11:53 AM 0.3 mL 01/22/2019 Intramuscular   Manufacturer: [place]   Lot: [id number]   [place]: [id number]

## 2019-03-23 DIAGNOSIS — Z961 Presence of intraocular lens: Secondary | ICD-10-CM | POA: Diagnosis not present

## 2019-03-23 DIAGNOSIS — H401131 Primary open-angle glaucoma, bilateral, mild stage: Secondary | ICD-10-CM | POA: Diagnosis not present

## 2019-03-29 ENCOUNTER — Ambulatory Visit: Payer: Medicare Other | Attending: Internal Medicine

## 2019-03-29 DIAGNOSIS — Z23 Encounter for immunization: Secondary | ICD-10-CM | POA: Insufficient documentation

## 2019-03-29 NOTE — Progress Notes (Signed)
   Covid-19 Vaccination Clinic  Name:  [name]    MRN: [id number] DOB: 21-Dec-1945  03/29/2019  [name] was observed post Covid-19 immunization for 30 minutes based on pre-vaccination screening without incidence. She was provided with Vaccine Information Sheet and instruction to access the V-Safe system.   [name] was instructed to call 911 with any severe reactions post vaccine: Marland Kitchen Difficulty breathing  . Swelling of your face and throat  . A fast heartbeat  . A bad rash all over your body  . Dizziness and weakness    Immunizations Administered    Name Date Dose VIS Date Route   Pfizer COVID-19 Vaccine 03/29/2019 11:40 AM 0.3 mL 01/22/2019 Intramuscular   Manufacturer: [place]   Lot: [id number]   [place]: [id number]

## 2019-03-31 ENCOUNTER — Ambulatory Visit: Payer: Self-pay

## 2019-04-14 DIAGNOSIS — R05 Cough: Secondary | ICD-10-CM | POA: Diagnosis not present

## 2019-04-29 ENCOUNTER — Other Ambulatory Visit: Payer: Self-pay | Admitting: Family Medicine

## 2019-04-29 ENCOUNTER — Ambulatory Visit
Admission: RE | Admit: 2019-04-29 | Discharge: 2019-04-29 | Disposition: A | Discharge status: Home / Self Care | Payer: Medicare Other | Source: Ambulatory Visit | Attending: Family Medicine | Admitting: Family Medicine

## 2019-04-29 DIAGNOSIS — R05 Cough: Secondary | ICD-10-CM

## 2019-04-29 DIAGNOSIS — R9389 Abnormal findings on diagnostic imaging of other specified body structures: Secondary | ICD-10-CM

## 2019-04-29 DIAGNOSIS — R059 Cough, unspecified: Secondary | ICD-10-CM

## 2019-04-30 ENCOUNTER — Other Ambulatory Visit: Payer: Self-pay

## 2019-04-30 ENCOUNTER — Ambulatory Visit
Admission: RE | Admit: 2019-04-30 | Discharge: 2019-04-30 | Disposition: A | Discharge status: Home / Self Care | Payer: Medicare Other | Source: Ambulatory Visit | Attending: Family Medicine | Admitting: Family Medicine

## 2019-04-30 DIAGNOSIS — R918 Other nonspecific abnormal finding of lung field: Secondary | ICD-10-CM | POA: Diagnosis not present

## 2019-04-30 DIAGNOSIS — J439 Emphysema, unspecified: Secondary | ICD-10-CM | POA: Diagnosis not present

## 2019-04-30 DIAGNOSIS — R9389 Abnormal findings on diagnostic imaging of other specified body structures: Secondary | ICD-10-CM

## 2019-04-30 MED ORDER — IOPAMIDOL (ISOVUE-300) INJECTION 61%
75.0000 mL | Freq: Once | INTRAVENOUS | Status: AC | PRN
  Administered 2019-04-30: 75 mL via INTRAVENOUS

## 2019-05-03 ENCOUNTER — Other Ambulatory Visit (HOSPITAL_COMMUNITY): Payer: Self-pay | Admitting: Family Medicine

## 2019-05-03 DIAGNOSIS — C3490 Malignant neoplasm of unspecified part of unspecified bronchus or lung: Secondary | ICD-10-CM

## 2019-05-03 DIAGNOSIS — R918 Other nonspecific abnormal finding of lung field: Secondary | ICD-10-CM

## 2019-05-06 ENCOUNTER — Encounter: Payer: Self-pay | Admitting: *Deleted

## 2019-05-06 NOTE — Progress Notes (Signed)
Reached out to [name] to introduce myself as the office RN Navigator and explain our new patient process. Reviewed the reason for their referral and scheduled their new patient appointment along with labs. Provided address and directions to the office including call back phone number. Reviewed with patient any concerns they may have or any possible barriers to attending their appointment.   Informed patient about my role as a navigator and that I will meet with them prior to their New Patient appointment and more fully discuss what services I can provide. At this time patient has no further questions or needs.

## 2019-05-07 ENCOUNTER — Other Ambulatory Visit: Payer: Self-pay | Admitting: *Deleted

## 2019-05-07 ENCOUNTER — Encounter: Payer: Self-pay | Admitting: Hematology

## 2019-05-07 ENCOUNTER — Inpatient Hospital Stay: Payer: Medicare Other | Attending: Hematology

## 2019-05-07 ENCOUNTER — Inpatient Hospital Stay (HOSPITAL_BASED_OUTPATIENT_CLINIC_OR_DEPARTMENT_OTHER): Payer: Medicare Other | Admitting: Hematology

## 2019-05-07 ENCOUNTER — Encounter: Payer: Self-pay | Admitting: *Deleted

## 2019-05-07 ENCOUNTER — Other Ambulatory Visit: Payer: Self-pay | Admitting: Hematology

## 2019-05-07 ENCOUNTER — Telehealth: Payer: Self-pay | Admitting: *Deleted

## 2019-05-07 ENCOUNTER — Other Ambulatory Visit: Payer: Self-pay

## 2019-05-07 VITALS — BP 156/67 | HR 71 | Temp 97.5°F | Resp 18 | Ht 59.5 in | Wt 116.0 lb

## 2019-05-07 DIAGNOSIS — R918 Other nonspecific abnormal finding of lung field: Secondary | ICD-10-CM

## 2019-05-07 DIAGNOSIS — D649 Anemia, unspecified: Secondary | ICD-10-CM | POA: Diagnosis not present

## 2019-05-07 DIAGNOSIS — R599 Enlarged lymph nodes, unspecified: Secondary | ICD-10-CM | POA: Diagnosis not present

## 2019-05-07 DIAGNOSIS — C3490 Malignant neoplasm of unspecified part of unspecified bronchus or lung: Secondary | ICD-10-CM | POA: Insufficient documentation

## 2019-05-07 DIAGNOSIS — Z87891 Personal history of nicotine dependence: Secondary | ICD-10-CM | POA: Insufficient documentation

## 2019-05-07 DIAGNOSIS — E871 Hypo-osmolality and hyponatremia: Secondary | ICD-10-CM | POA: Insufficient documentation

## 2019-05-07 DIAGNOSIS — Z1159 Encounter for screening for other viral diseases: Secondary | ICD-10-CM

## 2019-05-07 LAB — CBC WITH DIFFERENTIAL (CANCER CENTER ONLY)
Abs Immature Granulocytes: 0.01 10*3/uL (ref 0.00–0.07)
Basophils Absolute: 0 10*3/uL (ref 0.0–0.1)
Basophils Relative: 1 %
Eosinophils Absolute: 0.1 10*3/uL (ref 0.0–0.5)
Eosinophils Relative: 1 %
HCT: 35.9 % — ABNORMAL LOW (ref 36.0–46.0)
Hemoglobin: 11.5 g/dL — ABNORMAL LOW (ref 12.0–15.0)
Immature Granulocytes: 0 %
Lymphocytes Relative: 18 %
Lymphs Abs: 1 10*3/uL (ref 0.7–4.0)
MCH: 28 pg (ref 26.0–34.0)
MCHC: 32 g/dL (ref 30.0–36.0)
MCV: 87.3 fL (ref 80.0–100.0)
Monocytes Absolute: 0.7 10*3/uL (ref 0.1–1.0)
Monocytes Relative: 13 %
Neutro Abs: 3.8 10*3/uL (ref 1.7–7.7)
Neutrophils Relative %: 67 %
Platelet Count: 340 10*3/uL (ref 150–400)
RBC: 4.11 MIL/uL (ref 3.87–5.11)
RDW: 13.6 % (ref 11.5–15.5)
WBC Count: 5.7 10*3/uL (ref 4.0–10.5)
nRBC: 0 % (ref 0.0–0.2)

## 2019-05-07 LAB — CMP (CANCER CENTER ONLY)
ALT: 19 U/L (ref 0–44)
AST: 9 U/L — ABNORMAL LOW (ref 15–41)
Albumin: 4.2 g/dL (ref 3.5–5.0)
Alkaline Phosphatase: 121 U/L (ref 38–126)
Anion gap: 7 (ref 5–15)
BUN: 14 mg/dL (ref 8–23)
CO2: 29 mmol/L (ref 22–32)
Calcium: 9.8 mg/dL (ref 8.9–10.3)
Chloride: 95 mmol/L — ABNORMAL LOW (ref 98–111)
Creatinine: 0.56 mg/dL (ref 0.44–1.00)
GFR, Est AFR Am: >60 mL/min (ref >60)
GFR, Estimated: >60 mL/min (ref >60)
Glucose, Bld: 102 mg/dL — ABNORMAL HIGH (ref 70–99)
Potassium: 4.2 mmol/L (ref 3.5–5.1)
Sodium: 131 mmol/L — ABNORMAL LOW (ref 135–145)
Total Bilirubin: 0.3 mg/dL (ref 0.3–1.2)
Total Protein: 7.5 g/dL (ref 6.5–8.1)

## 2019-05-07 MED ORDER — LORAZEPAM 0.5 MG PO TABS: 0.5000 mg | ORAL_TABLET | Freq: Three times a day (TID) | ORAL | 0 refills | Status: AC

## 2019-05-07 NOTE — Progress Notes (Signed)
Initial RN Navigator Patient Visit  Name: [name] Date of Referral : 05/06/19 Diagnosis: Right lower lobe lung mass  Met with patient prior to their visit with MD. [name] patient "Your Patient Navigator" handout which explains my role, areas in which I am able to help, and all the contact information for myself and the office. Also gave patient MD and Navigator business card. Reviewed with patient the general overview of expected course after initial diagnosis and time frame for all steps to be completed.  New patient packet given to patient which includes: orientation to office and staff; campus directory; education on My Chart and Advance Directives; and patient centered education on lung cancer.  Patient completed visit with Dr. Maylon Peppers  Patient is already scheduled for PET scan on 05/10/19  She will need: Referral to pulmonary for biopsy - Dr Erskine Emery can see patient on Tuesday. She needs a covid test prior to appointment. Covid screen scheduled for tomorrow.  MRI Brain - First available at [facility] is 05/19/19. We can get scan completed at [facility], however patient would rather not have her MRI on Easter Weekend as she has family coming in. She is slightly claustrophobic, so Dr Maylon Peppers will send in something to help her relax.   Depending on the results from above, she may also need referral to RadOnc and Port placement.   Patient understands all follow up procedures and expectations. They have my number to reach out for any further clarification or additional needs.

## 2019-05-07 NOTE — Telephone Encounter (Signed)
-----   Message from Cordelia Poche, RN sent at 05/07/2019  1:40 PM EDT ----- Regarding: RE: Lung mass Covid testing is scheduled for tomorrow at 11:10a.   Roselyn Reef RN ----- Message ----- From: Tish Men, MD Sent: 05/07/2019   1:32 PM EDT To: Cordelia Poche, RN, Garner Nash, DO, # Subject: RE: Lung mass                                  Thanks Linna Hoff!  I appreciate your help.  Have a good weekend.  Eulas Post  ----- Message ----- From: Candee Furbish, MD Sent: 05/07/2019   1:09 PM EDT To: Garner Nash, DO, Tish Men, MD, # Subject: Melton Alar: Lung mass                                  Hi can we get this patient swabbed for covid this weekend and scheduled in endoscopy with me on Tuesday 3/30 for ebus (cpt 15726) for mediastinal adenopathy. I will do the consult note the day of procedure in preop.  Thanks Linna Hoff ----- Message ----- From: Tish Men, MD Sent: 05/07/2019  12:55 PM EDT To: Garner Nash, DO, Candee Furbish, MD Subject: Lung mass                                      Hi Frann Rider,  How are you? I am seeing Ms. Renken, who has a new R lung mass with thoracic lymphadenopathy, suspicious for lung cancer. She has PET scheduled next Monday. Would y'all be able to get her in EBUS soon?Thanks for your help.  Eulas Post

## 2019-05-07 NOTE — Telephone Encounter (Signed)
See staff message below, patient needs EBUS on 3/30 in endoscopy per Dr. Tamala Julian, patient already scheduled for covid

## 2019-05-07 NOTE — Progress Notes (Signed)
[place] NOTE  Patient Care Team: Lawerance Cruel, MD as PCP - General (Family Medicine) Tish Men, MD as Medical Oncologist (Oncology) Cordelia Poche, RN as Oncology Nurse Navigator  HEME/ONC OVERVIEW: 1. RLL lung mass -04/2019:   Large RLL lung mass ~7x5cm with ipsilateral hilar and paratracheal adenopathy; moderate emphysema   ASSESSMENT & PLAN:   RLL lung mass -I reviewed the patient's records in detail, including PCP clinic notes, lab and imaging results, and the pathology reports -I also independently reviewed the radiologic images of CT chest, and agree with findings documented -In summary, patient presented to her PCP for evaluation of intermittent dry cough x 7 months, for which chest x-ray showed a possible right lower lobe lung mass.  CT chest confirmed large right lower lobe lung mass, measuring approximately 7 x 5 cm with irregular margin, as well as ipsilateral hilar and paratracheal adenopathy.  Patient was referred to oncology for further evaluation. -I reviewed the imaging results in detail with the patient -Given the suspicion for primary lung cancer, I have referred the patient to pulmonary medicine for urgent EBUS with biopsy for tissue confirmation -In addition, I have requested PET and MRI brain w/ contrast to complete the staging studies  -If malignancy is confirmed, she will also need port placement to facilitate treatment -Pending the work-up above, we will determine the treatment options   Hyponatremia -Etiology unclear; ddx includes insufficient dietary intake vs suspected lung cancer -Na 131 today, slightly low; patient denies any symptoms -As the patient is very strict about avoiding salt intake, I encouraged her to increase dietary sodium intake -We will repeat her Na at the next visit to monitor it closely   Normocytic anemia -Hgb 11.5 today, slightly low -Possibly due to anemia of chronic disease -Patient denies any  symptoms of bleeding -I have ordered iron profile at the next visit   Orders Placed This Encounter  Procedures  . MR Brain W Wo Contrast    Standing Status:   Future    Standing Expiration Date:   05/06/2020    Order Specific Question:   ** REASON FOR EXAM (FREE TEXT)    Answer:   Right lower lung mass, concerning for malignancy    Order Specific Question:   If indicated for the ordered procedure, I authorize the administration of contrast media per Radiology protocol    Answer:   Yes    Order Specific Question:   What is the patient's sedation requirement?    Answer:   No Sedation    Order Specific Question:   Does the patient have a pacemaker or implanted devices?    Answer:   No    Order Specific Question:   Use SRS Protocol?    Answer:   No    Order Specific Question:   Radiology Contrast Protocol - do NOT remove file path    Answer:   [email or web address]    Order Specific Question:   Preferred imaging location?    Answer:   [facility] (table limit-350 lbs)  . Ambulatory referral to Pulmonology    Referral Priority:   Urgent    Referral Type:   Consultation    Referral Reason:   Specialty Services Required    Requested Specialty:   Pulmonary Disease    Number of Visits Requested:   1    The total time spent in the encounter was 65 minutes, including face-to-face time with the patient, review of various tests  results, order additional studies/medications, documentation, and coordination of care plan.   All questions were answered. The patient knows to call the clinic with any problems, questions or concerns. No barriers to learning was detected.  Return in 2 weeks for labs and clinic follow-up.   Tish Men, MD 3/26/202112:59 PM  CHIEF COMPLAINTS/PURPOSE OF CONSULTATION:  "I am just coughing"  HISTORY OF PRESENTING ILLNESS:  [name] 74 y.o. female is here because of a new RLL lung mass, suspicious for primary lung cancer.  Patient  presented to her PCP in early 04/2019 for evaluation of intermittent dry cough x 7 months.  Her cough was initially dry, but over the past a few weeks, she also has had some intermittent clear sputum production.  She denies any constitutional symptoms, chest pain, dyspnea, or hemoptysis.  She was treated for possible acid reflux with PPI without improvement, and CXR showed an irregular mass in the RLL, suspicious for lung cancer.  CT chest confirmed large right lower lobe lung mass, measuring approximately 7 x 5 cm with irregular margin, as well as ipsilateral hilar and paratracheal adenopathy.  Patient was referred to oncology for further evaluation.  REVIEW OF SYSTEMS:   Constitutional: ( - ) fevers, ( - )  chills , ( - ) night sweats Eyes: ( - ) blurriness of vision, ( - ) double vision, ( - ) watery eyes Ears, nose, mouth, throat, and face: ( - ) mucositis, ( - ) sore throat Respiratory: ( = ) cough, ( - ) dyspnea, ( - ) wheezes Cardiovascular: ( - ) palpitation, ( - ) chest discomfort, ( - ) lower extremity swelling Gastrointestinal:  ( - ) nausea, ( - ) heartburn, ( - ) change in bowel habits Skin: ( - ) abnormal skin rashes Lymphatics: ( - ) new lymphadenopathy, ( - ) easy bruising Neurological: ( - ) numbness, ( - ) tingling, ( - ) new weaknesses Behavioral/Psych: ( - ) mood change, ( - ) new changes  All other systems were reviewed with the patient and are negative.  I have reviewed her chart and materials related to her cancer extensively and collaborated history with the patient. Summary of oncologic history is as follows: Oncology History   No history exists.    MEDICAL HISTORY:  Past Medical History:  Diagnosis Date  . Glaucoma   . Thyroid condition     SURGICAL HISTORY: Past Surgical History:  Procedure Laterality Date  . SHOULDER SURGERY    . vocal cord polyp removal      SOCIAL HISTORY: Social History   Socioeconomic History  . Marital status: Married    Spouse  name: Not on file  . Number of children: Not on file  . Years of education: Not on file  . Highest education level: Not on file  Occupational History  . Not on file  Tobacco Use  . Smoking status: Former Smoker    Types: Cigarettes    Quit date: 02/13/2009    Years since quitting: 10.2  . Smokeless tobacco: Never Used  . Tobacco comment: Quit in 2011  Substance and Sexual Activity  . Alcohol use: Yes    Comment: per day  . Drug use: No  . Sexual activity: Not on file  Other Topics Concern  . Not on file  Social History Narrative  . Not on file   Social Determinants of Health   Financial Resource Strain:   . Difficulty of Paying Living Expenses:  Food Insecurity:   . Worried About Charity fundraiser in the Last Year:   . Arboriculturist in the Last Year:   Transportation Needs:   . Film/video editor (Medical):   Marland Kitchen Lack of Transportation (Non-Medical):   Physical Activity:   . Days of Exercise per Week:   . Minutes of Exercise per Session:   Stress:   . Feeling of Stress :   Social Connections:   . Frequency of Communication with Friends and Family:   . Frequency of Social Gatherings with Friends and Family:   . Attends Religious Services:   . Active Member of Clubs or Organizations:   . Attends Archivist Meetings:   Marland Kitchen Marital Status:   Intimate Partner Violence:   . Fear of Current or Ex-Partner:   . Emotionally Abused:   Marland Kitchen Physically Abused:   . Sexually Abused:     FAMILY HISTORY: No family history on file.  ALLERGIES:  is allergic to timolol; brimonidine; hydrocodone; and nutritional supplements.  MEDICATIONS:  Current Outpatient Medications  Medication Sig Dispense Refill  . Cholecalciferol (VITAMIN D) 1000 UNITS capsule Take 1,000 Units by mouth daily.      . dorzolamide (TRUSOPT) 2 % ophthalmic solution 1 drop 2 (two) times daily.    Marland Kitchen levothyroxine (SYNTHROID) 88 MCG tablet Take 1 tablet by mouth daily.    . travoprost,  benzalkonium, (TRAVATAN) 0.004 % ophthalmic solution Place 1 drop into both eyes at bedtime.       No current facility-administered medications for this visit.    PHYSICAL EXAMINATION: ECOG PERFORMANCE STATUS: 1 - Symptomatic but completely ambulatory  Vitals:   05/07/19 1236  BP: (!) 156/67  Pulse: 71  Resp: 18  Temp: (!) 97.5 F (36.4 C)  SpO2: 96%   Filed Weights   05/07/19 1236  Weight: 116 lb (52.6 kg)    GENERAL: alert, no distress and comfortable, thin  SKIN: skin color, texture, turgor are normal, no rashes or significant lesions EYES: conjunctiva are pink and non-injected, sclera clear OROPHARYNX: no exudate, no erythema; lips, buccal mucosa, and tongue normal  NECK: supple, non-tender LYMPH:  no palpable lymphadenopathy in the cervical LUNGS: clear to auscultation with normal breathing effort HEART: regular rate & rhythm, no murmurs, no lower extremity edema ABDOMEN: soft, non-tender, non-distended, normal bowel sounds Musculoskeletal: no cyanosis of digits and no clubbing  PSYCH: alert & oriented x 3, fluent speech  LABORATORY DATA:  I have reviewed the data as listed Lab Results  Component Value Date   WBC 5.7 05/07/2019   HGB 11.5 (L) 05/07/2019   HCT 35.9 (L) 05/07/2019   MCV 87.3 05/07/2019   PLT 340 05/07/2019   Lab Results  Component Value Date   NA 131 (L) 05/07/2019   K 4.2 05/07/2019   CL 95 (L) 05/07/2019   CO2 29 05/07/2019    RADIOGRAPHIC STUDIES: I have personally reviewed the radiological images as listed and agreed with the findings in the report. DG Chest 2 View  Result Date: 04/29/2019 CLINICAL DATA:  Nonproductive cough for 6 months. Shortness of breath. Former smoker. EXAM: CHEST - 2 VIEW COMPARISON:  03/05/2013 FINDINGS: The heart size and mediastinal contours are within normal limits. An irregular masslike opacity is seen in the posterior right lower lobe measuring approximately 8 cm, suspicious for bronchogenic carcinoma. Left  lung is clear. No evidence of pleural effusion. IMPRESSION: Irregular masslike opacity in posterior right lower lobe, highly suspicious for bronchogenic carcinoma.  Chest CT with contrast is recommended further evaluation. These results will be called to the ordering clinician or representative by the Radiologist Assistant, and communication documented in the PACS or [employer]. Electronically Signed   By: Marlaine Hind M.D.   On: 04/29/2019 10:22   CT CHEST W CONTRAST  Result Date: 04/30/2019 CLINICAL DATA:  Cough. Lung mass right lower lobe lesion seen on chest x-ray. EXAM: CT CHEST WITH CONTRAST TECHNIQUE: Multidetector CT imaging of the chest was performed during intravenous contrast administration. CONTRAST:  54mL ISOVUE-300 IOPAMIDOL (ISOVUE-300) INJECTION 61% COMPARISON:  Chest radiograph 04/29/2019. Chest CTA 03/05/2013 FINDINGS: Cardiovascular: Calcified aortic atherosclerosis. Mild aortic tortuosity without aneurysm. No aortic dissection. Heart is normal in size. No pericardial effusion. There are coronary artery calcifications. Evaluation on tailored for pulmonary embolus evaluation, however there are no filling defects in the pulmonary arteries to the distal lobar/proximal segmental level. Mediastinum/Nodes: Multiple prominent mildly enlarged right hilar lymph nodes including a 13 mm node series 2, image 70 and 11 mm node series 2, image 83. Additional small infrahilar nodes extend along the bronchovascular bundle. There is an 11 mm lower paratracheal node. Small upper mediastinal nodes measuring up to 7 mm. No left hilar adenopathy. No visualized thyroid nodule. No esophageal wall thickening. Lungs/Pleura: Heterogeneous right lower lobe pulmonary mass measures 6.9 x 4.6 x 5.1 cm with central hypodensity suspicious for necrosis. Margins are irregular and spiculated with surrounding ground-glass opacity. Posterior extent extends to the pleura. Irregular spiculations extend to the minor fissure.  Moderate emphysema. No additional pulmonary nodule or mass. Minimal scarring in the anterior lingula. Trace right pleural thickening without frank effusion. Upper Abdomen: Slight left adrenal thickening without dominant nodule. Normal right adrenal gland. No evidence of focal hepatic lesion. The included spleen, pancreas, kidneys, stomach and colon are unremarkable. Atherosclerosis of upper abdominal aorta. Musculoskeletal: No destructive lytic or focal blastic osseous lesions. No chest wall lesions. IMPRESSION: 1. Heterogeneous right lower lobe pulmonary mass measuring 6.9 x 4.6 x 5.1 cm, highly concerning for primary bronchogenic malignancy. Margins are irregular with central hypodensity/necrosis. 2. Right hilar and lower paratracheal adenopathy, suspicious for nodal metastatic disease. 3. Moderate emphysema. Aortic Atherosclerosis (ICD10-I70.0) and Emphysema (ICD10-J43.9). These results will be called to the ordering clinician or representative by the Radiologist Assistant, and communication documented in the PACS or [employer]. Electronically Signed   By: Keith Rake M.D.   On: 04/30/2019 15:38    PATHOLOGY: I have reviewed the pathology reports as documented in the oncologist history.

## 2019-05-07 NOTE — Telephone Encounter (Signed)
I have scheduled this for 3/30 at 10:30.  Send send staff message to Dr Tamala Julian to make him aware.

## 2019-05-08 ENCOUNTER — Other Ambulatory Visit (HOSPITAL_COMMUNITY)
Admission: RE | Admit: 2019-05-08 | Discharge: 2019-05-08 | Disposition: A | Discharge status: Home / Self Care | Payer: Medicare Other | Source: Ambulatory Visit | Attending: Internal Medicine | Admitting: Internal Medicine

## 2019-05-08 DIAGNOSIS — Z20822 Contact with and (suspected) exposure to covid-19: Secondary | ICD-10-CM | POA: Diagnosis not present

## 2019-05-08 DIAGNOSIS — Z01812 Encounter for preprocedural laboratory examination: Secondary | ICD-10-CM | POA: Diagnosis not present

## 2019-05-08 LAB — SARS CORONAVIRUS 2 (TAT 6-24 HRS): SARS Coronavirus 2: NEGATIVE

## 2019-05-10 ENCOUNTER — Encounter (HOSPITAL_COMMUNITY): Payer: Self-pay | Admitting: Internal Medicine

## 2019-05-10 ENCOUNTER — Encounter (HOSPITAL_COMMUNITY)
Admission: RE | Admit: 2019-05-10 | Discharge: 2019-05-10 | Disposition: A | Discharge status: Home / Self Care | Payer: Medicare Other | Source: Ambulatory Visit | Attending: Family Medicine | Admitting: Family Medicine

## 2019-05-10 ENCOUNTER — Ambulatory Visit (HOSPITAL_COMMUNITY): Payer: Medicare Other

## 2019-05-10 ENCOUNTER — Other Ambulatory Visit: Payer: Self-pay

## 2019-05-10 DIAGNOSIS — I700 Atherosclerosis of aorta: Secondary | ICD-10-CM | POA: Insufficient documentation

## 2019-05-10 DIAGNOSIS — R918 Other nonspecific abnormal finding of lung field: Secondary | ICD-10-CM | POA: Insufficient documentation

## 2019-05-10 DIAGNOSIS — J439 Emphysema, unspecified: Secondary | ICD-10-CM | POA: Insufficient documentation

## 2019-05-10 DIAGNOSIS — Z79899 Other long term (current) drug therapy: Secondary | ICD-10-CM | POA: Diagnosis not present

## 2019-05-10 DIAGNOSIS — C3490 Malignant neoplasm of unspecified part of unspecified bronchus or lung: Secondary | ICD-10-CM | POA: Diagnosis not present

## 2019-05-10 DIAGNOSIS — R9389 Abnormal findings on diagnostic imaging of other specified body structures: Secondary | ICD-10-CM | POA: Diagnosis not present

## 2019-05-10 LAB — LACTATE DEHYDROGENASE: LDH: 142 U/L (ref 98–192)

## 2019-05-10 LAB — GLUCOSE, CAPILLARY: Glucose-Capillary: 98 mg/dL (ref 70–99)

## 2019-05-10 MED ORDER — FLUDEOXYGLUCOSE F - 18 (FDG) INJECTION
6.2700 | Freq: Once | INTRAVENOUS | Status: AC | PRN
  Administered 2019-05-10: 13:00:00 6.27 via INTRAVENOUS

## 2019-05-10 NOTE — Telephone Encounter (Signed)
Per Ander Purpura- Dr Tamala Julian states pt needs to hold ASA until after procedure  Needs to be at [facility] admitting 3/30 at 9 am NPO and have someone drive her  I spoke with her and notified of this and she verbalized understanding

## 2019-05-10 NOTE — Telephone Encounter (Signed)
ATC patient to make sure patient is aware of information for their EBUS on 3/30 , unable to reach left message to call back

## 2019-05-10 NOTE — Telephone Encounter (Signed)
Pt called back, please return call  

## 2019-05-11 ENCOUNTER — Encounter (HOSPITAL_COMMUNITY): Payer: Self-pay | Admitting: Internal Medicine

## 2019-05-11 ENCOUNTER — Ambulatory Visit (HOSPITAL_COMMUNITY)
Admission: RE | Admit: 2019-05-11 | Discharge: 2019-05-11 | Disposition: A | Discharge status: Home / Self Care | Payer: Medicare Other | Attending: Internal Medicine | Admitting: Internal Medicine

## 2019-05-11 ENCOUNTER — Ambulatory Visit (HOSPITAL_COMMUNITY): Payer: Medicare Other

## 2019-05-11 ENCOUNTER — Ambulatory Visit (HOSPITAL_COMMUNITY): Payer: Medicare Other | Admitting: Anesthesiology

## 2019-05-11 ENCOUNTER — Encounter (HOSPITAL_COMMUNITY)
Admission: RE | Disposition: A | Discharge status: Home / Self Care | Payer: Self-pay | Source: Home / Self Care | Attending: Internal Medicine

## 2019-05-11 ENCOUNTER — Other Ambulatory Visit: Payer: Self-pay

## 2019-05-11 DIAGNOSIS — C3490 Malignant neoplasm of unspecified part of unspecified bronchus or lung: Secondary | ICD-10-CM | POA: Diagnosis not present

## 2019-05-11 DIAGNOSIS — Z888 Allergy status to other drugs, medicaments and biological substances status: Secondary | ICD-10-CM | POA: Diagnosis not present

## 2019-05-11 DIAGNOSIS — R198 Other specified symptoms and signs involving the digestive system and abdomen: Secondary | ICD-10-CM | POA: Diagnosis not present

## 2019-05-11 DIAGNOSIS — D649 Anemia, unspecified: Secondary | ICD-10-CM | POA: Diagnosis not present

## 2019-05-11 DIAGNOSIS — E039 Hypothyroidism, unspecified: Secondary | ICD-10-CM | POA: Diagnosis not present

## 2019-05-11 DIAGNOSIS — R590 Localized enlarged lymph nodes: Secondary | ICD-10-CM | POA: Diagnosis not present

## 2019-05-11 DIAGNOSIS — E871 Hypo-osmolality and hyponatremia: Secondary | ICD-10-CM | POA: Diagnosis not present

## 2019-05-11 DIAGNOSIS — J189 Pneumonia, unspecified organism: Secondary | ICD-10-CM | POA: Diagnosis not present

## 2019-05-11 DIAGNOSIS — Z87891 Personal history of nicotine dependence: Secondary | ICD-10-CM | POA: Diagnosis not present

## 2019-05-11 DIAGNOSIS — Z885 Allergy status to narcotic agent status: Secondary | ICD-10-CM | POA: Diagnosis not present

## 2019-05-11 DIAGNOSIS — C3431 Malignant neoplasm of lower lobe, right bronchus or lung: Secondary | ICD-10-CM | POA: Insufficient documentation

## 2019-05-11 DIAGNOSIS — J984 Other disorders of lung: Secondary | ICD-10-CM

## 2019-05-11 DIAGNOSIS — R05 Cough: Secondary | ICD-10-CM | POA: Diagnosis not present

## 2019-05-11 DIAGNOSIS — Z9889 Other specified postprocedural states: Secondary | ICD-10-CM

## 2019-05-11 HISTORY — PX: VIDEO BRONCHOSCOPY WITH ENDOBRONCHIAL ULTRASOUND: SHX6177

## 2019-05-11 HISTORY — PX: FINE NEEDLE ASPIRATION: SHX5430

## 2019-05-11 HISTORY — PX: BRONCHIAL WASHINGS: SHX5105

## 2019-05-11 HISTORY — DX: Dyspnea, unspecified: R06.00

## 2019-05-11 HISTORY — DX: Hypothyroidism, unspecified: E03.9

## 2019-05-11 HISTORY — PX: BIOPSY: SHX5522

## 2019-05-11 LAB — CBC
HCT: 39.3 % (ref 36.0–46.0)
Hemoglobin: 12.2 g/dL (ref 12.0–15.0)
MCH: 27.9 pg (ref 26.0–34.0)
MCHC: 31 g/dL (ref 30.0–36.0)
MCV: 89.7 fL (ref 80.0–100.0)
Platelets: 323 10*3/uL (ref 150–400)
RBC: 4.38 MIL/uL (ref 3.87–5.11)
RDW: 13.6 % (ref 11.5–15.5)
WBC: 5.9 10*3/uL (ref 4.0–10.5)
nRBC: 0 % (ref 0.0–0.2)

## 2019-05-11 LAB — COMPREHENSIVE METABOLIC PANEL
ALT: 23 U/L (ref 0–44)
AST: 17 U/L (ref 15–41)
Albumin: 3.6 g/dL (ref 3.5–5.0)
Alkaline Phosphatase: 118 U/L (ref 38–126)
Anion gap: 14 (ref 5–15)
BUN: 12 mg/dL (ref 8–23)
CO2: 22 mmol/L (ref 22–32)
Calcium: 9.2 mg/dL (ref 8.9–10.3)
Chloride: 98 mmol/L (ref 98–111)
Creatinine, Ser: 0.41 mg/dL — ABNORMAL LOW (ref 0.44–1.00)
GFR calc Af Amer: >60 mL/min (ref >60)
GFR calc non Af Amer: >60 mL/min (ref >60)
Glucose, Bld: 84 mg/dL (ref 70–99)
Potassium: 4.5 mmol/L (ref 3.5–5.1)
Sodium: 134 mmol/L — ABNORMAL LOW (ref 135–145)
Total Bilirubin: 0.8 mg/dL (ref 0.3–1.2)
Total Protein: 7.8 g/dL (ref 6.5–8.1)

## 2019-05-11 LAB — APTT: aPTT: 31 seconds (ref 24–36)

## 2019-05-11 LAB — PROTIME-INR
INR: 1 (ref 0.8–1.2)
Prothrombin Time: 13.5 seconds (ref 11.4–15.2)

## 2019-05-11 SURGERY — BRONCHOSCOPY, WITH EBUS
Anesthesia: General

## 2019-05-11 MED ORDER — DIPHENHYDRAMINE HCL 25 MG PO TABS: 50.0000 mg | ORAL_TABLET | Freq: Every day | ORAL | Status: DC | PRN

## 2019-05-11 MED ORDER — SUGAMMADEX SODIUM 200 MG/2ML IV SOLN
INTRAVENOUS | Status: DC | PRN
  Administered 2019-05-11: 100 mg via INTRAVENOUS

## 2019-05-11 MED ORDER — LACTATED RINGERS IV SOLN: INTRAVENOUS | Status: DC

## 2019-05-11 MED ORDER — VITAMIN D 50 MCG (2000 UT) PO TABS: 2000.0000 [IU] | ORAL_TABLET | Freq: Every day | ORAL | Status: DC

## 2019-05-11 MED ORDER — TRAVOPROST 0.004 % OP SOLN: 1.0000 [drp] | Freq: Every day | OPHTHALMIC | Status: DC

## 2019-05-11 MED ORDER — ASPIRIN EC 325 MG PO TBEC: 325.0000 mg | DELAYED_RELEASE_TABLET | Freq: Every day | ORAL | Status: DC | PRN

## 2019-05-11 MED ORDER — LEVOTHYROXINE SODIUM 88 MCG PO TABS: 88.0000 ug | ORAL_TABLET | ORAL | 1 refills | Status: DC

## 2019-05-11 MED ORDER — DEXAMETHASONE SODIUM PHOSPHATE 10 MG/ML IJ SOLN
INTRAMUSCULAR | Status: DC | PRN
  Administered 2019-05-11: 4 mg via INTRAVENOUS

## 2019-05-11 MED ORDER — DORZOLAMIDE HCL 2 % OP SOLN: 1.0000 [drp] | Freq: Two times a day (BID) | OPHTHALMIC | Status: DC

## 2019-05-11 MED ORDER — FENTANYL CITRATE (PF) 250 MCG/5ML IJ SOLN
INTRAMUSCULAR | Status: DC | PRN
  Administered 2019-05-11: 50 ug via INTRAVENOUS

## 2019-05-11 MED ORDER — ROCURONIUM BROMIDE 10 MG/ML (PF) SYRINGE
PREFILLED_SYRINGE | INTRAVENOUS | Status: DC | PRN
  Administered 2019-05-11: 20 mg via INTRAVENOUS

## 2019-05-11 MED ORDER — ONDANSETRON HCL 4 MG/2ML IJ SOLN
INTRAMUSCULAR | Status: DC | PRN
  Administered 2019-05-11: 4 mg via INTRAVENOUS

## 2019-05-11 MED ORDER — FAMOTIDINE 10 MG PO TABS: 10.0000 mg | ORAL_TABLET | Freq: Every day | ORAL | Status: DC | PRN

## 2019-05-11 MED ORDER — LIDOCAINE 2% (20 MG/ML) 5 ML SYRINGE
INTRAMUSCULAR | Status: DC | PRN
  Administered 2019-05-11: 100 mg via INTRAVENOUS

## 2019-05-11 MED ORDER — SUCCINYLCHOLINE CHLORIDE 200 MG/10ML IV SOSY
PREFILLED_SYRINGE | INTRAVENOUS | Status: DC | PRN
  Administered 2019-05-11: 120 mg via INTRAVENOUS

## 2019-05-11 MED ORDER — PHENYLEPHRINE 40 MCG/ML (10ML) SYRINGE FOR IV PUSH (FOR BLOOD PRESSURE SUPPORT)
PREFILLED_SYRINGE | INTRAVENOUS | Status: DC | PRN
  Administered 2019-05-11: 80 ug via INTRAVENOUS
  Administered 2019-05-11 (×3): 40 ug via INTRAVENOUS
  Administered 2019-05-11 (×3): 80 ug via INTRAVENOUS

## 2019-05-11 MED ORDER — PROPOFOL 10 MG/ML IV BOLUS
INTRAVENOUS | Status: DC | PRN
  Administered 2019-05-11: 150 mg via INTRAVENOUS

## 2019-05-11 MED ORDER — PHENYLEPHRINE HCL-NACL 10-0.9 MG/250ML-% IV SOLN
INTRAVENOUS | Status: DC | PRN
  Administered 2019-05-11: 25 ug/min via INTRAVENOUS

## 2019-05-11 SURGICAL SUPPLY — 33 items
ADAPTER VALVE BIOPSY EBUS (MISCELLANEOUS) IMPLANT
ADPTR VALVE BIOPSY EBUS (MISCELLANEOUS)
BRUSH CYTOL CELLEBRITY 1.5X140 (MISCELLANEOUS) IMPLANT
CANISTER SUCT 3000ML PPV (MISCELLANEOUS) ×3 IMPLANT
CONT SPEC 4OZ CLIKSEAL STRL BL (MISCELLANEOUS) ×3 IMPLANT
COVER BACK TABLE 60X90IN (DRAPES) ×3 IMPLANT
FORCEPS BIOP RJ4 1.8 (CUTTING FORCEPS) IMPLANT
GAUZE SPONGE 4X4 12PLY STRL (GAUZE/BANDAGES/DRESSINGS) ×3 IMPLANT
GLOVE BIO SURGEON STRL SZ7.5 (GLOVE) ×3 IMPLANT
GOWN STRL REUS W/ TWL LRG LVL3 (GOWN DISPOSABLE) ×2 IMPLANT
GOWN STRL REUS W/ TWL XL LVL3 (GOWN DISPOSABLE) ×2 IMPLANT
GOWN STRL REUS W/TWL LRG LVL3 (GOWN DISPOSABLE) ×1
GOWN STRL REUS W/TWL XL LVL3 (GOWN DISPOSABLE) ×2
KIT CLEAN ENDO COMPLIANCE (KITS) ×6 IMPLANT
KIT TURNOVER KIT B (KITS) ×3 IMPLANT
MARKER SKIN DUAL TIP RULER LAB (MISCELLANEOUS) ×3 IMPLANT
NEEDLE ASPIRATION VIZISHOT 19G (NEEDLE) IMPLANT
NEEDLE ASPIRATION VIZISHOT 21G (NEEDLE) IMPLANT
NS IRRIG 1000ML POUR BTL (IV SOLUTION) ×3 IMPLANT
OIL SILICONE PENTAX (PARTS (SERVICE/REPAIRS)) ×3 IMPLANT
PAD ARMBOARD 7.5X6 YLW CONV (MISCELLANEOUS) ×6 IMPLANT
SYR 20ML ECCENTRIC (SYRINGE) ×6 IMPLANT
SYR 20ML LL LF (SYRINGE) ×6 IMPLANT
SYR 50ML SLIP (SYRINGE) IMPLANT
SYR 5ML LUER SLIP (SYRINGE) ×3 IMPLANT
TOWEL GREEN STERILE FF (TOWEL DISPOSABLE) ×3 IMPLANT
TRAP SPECIMEN MUCOUS 40CC (MISCELLANEOUS) IMPLANT
TUBE CONNECTING 20X1/4 (TUBING) ×6 IMPLANT
UNDERPAD 30X30 (UNDERPADS AND DIAPERS) ×3 IMPLANT
VALVE BIOPSY  SINGLE USE (MISCELLANEOUS) ×3
VALVE BIOPSY SINGLE USE (MISCELLANEOUS) ×2 IMPLANT
VALVE SUCTION BRONCHIO DISP (MISCELLANEOUS) ×3 IMPLANT
WATER STERILE IRR 1000ML POUR (IV SOLUTION) ×3 IMPLANT

## 2019-05-11 NOTE — Consult Note (Signed)
[name]    [id number]    1945/04/14  Primary Care Physician:Ross, Dwyane Luo, MD  Referring Physician: No referring provider defined for this encounter.  Chief complaint:  RLL lung mass  HPI:  Patient referred by oncology for right lower lobe lung mass She had presented with primary care for dry cough about 7 months duration Occasional clear sputum production No hemoptysis, no chest pain No significant weight loss  Evaluation by CT did reveal a right lower lobe lung mass with adenopathy She had a PET scan performed showing significant activity in mediastinal nodes and the right lower lobe mass  Quit smoking about 10 years ago  No pertinent occupational history  Allergies as of 05/07/2019 - Review Complete 05/07/2019  Allergen Reaction Noted  . Timolol Nausea Only 09/11/2010  . Bee venom Swelling 05/07/2019  . Brimonidine Nausea Only 12/08/2018  . Hydrocodone Itching and Rash 09/11/2010    Past Medical History:  Diagnosis Date  . Dyspnea   . Glaucoma   . Hypothyroidism   . Thyroid condition     Past Surgical History:  Procedure Laterality Date  . COLONOSCOPY    . SHOULDER SURGERY    . vocal cord polyp removal      History reviewed. No pertinent family history.  Social History   Socioeconomic History  . Marital status: Married    Spouse name: Not on file  . Number of children: Not on file  . Years of education: Not on file  . Highest education level: Not on file  Occupational History  . Not on file  Tobacco Use  . Smoking status: Former Smoker    Types: Cigarettes    Quit date: 02/13/2010    Years since quitting: 9.2  . Smokeless tobacco: Never Used  . Tobacco comment: Quit in 2011  Substance and Sexual Activity  . Alcohol use: Yes    Comment: very rare, 1 glass of wine maybe every 3-4 weeks  . Drug use: No  . Sexual activity: Not on file  Other Topics Concern  . Not on file  Social History Narrative  . Not on file    Social Determinants of Health   Financial Resource Strain:   . Difficulty of Paying Living Expenses:   Food Insecurity:   . Worried About Charity fundraiser in the Last Year:   . Arboriculturist in the Last Year:   Transportation Needs:   . Film/video editor (Medical):   Marland Kitchen Lack of Transportation (Non-Medical):   Physical Activity:   . Days of Exercise per Week:   . Minutes of Exercise per Session:   Stress:   . Feeling of Stress :   Social Connections:   . Frequency of Communication with Friends and Family:   . Frequency of Social Gatherings with Friends and Family:   . Attends Religious Services:   . Active Member of Clubs or Organizations:   . Attends Archivist Meetings:   Marland Kitchen Marital Status:   Intimate Partner Violence:   . Fear of Current or Ex-Partner:   . Emotionally Abused:   Marland Kitchen Physically Abused:   . Sexually Abused:     Review of systems: Review of Systems  Constitutional: Negative for fever and chills.  Respiratory: as per HPI  Cardiovascular: Negative for chest pain and palpitations.  Gastrointestinal: Negative for vomiting, diarrhea, blood per rectum. Genitourinary: Negative for dysuria, urgency, frequency and hematuria.  Musculoskeletal: Negative  for myalgias, back pain and joint pain.  Skin: Negative for itching and rash.  Neurological: Negative for dizziness, tremors Endo/Heme/Allergies: Negative for environmental allergies.  Psychiatric/Behavioral: Negative for depression All other systems reviewed and are negative.  Physical Exam:  Vitals:   05/11/19 0817 05/11/19 0833  BP:  (!) 162/42  Pulse: 76   Resp: 20   Temp: 97.9 F (36.6 C)   SpO2: 100%    Gen:      No acute distress HEENT:  EOMI, sclera anicteric Neck:     No masses; no thyromegaly Lungs:    Clear to auscultation bilaterally; normal respiratory effort, clear breath sounds CV:         Regular rate and rhythm; no murmurs Abd:      + bowel sounds; soft, non-tender; no  palpable masses, no distension Psych: normal mood and affect  Data Reviewed: .  CT scan reviewed showing right lower lobe mass, mediastinal adenopathy  .  PET scan reviewed showing significant activity in right lower lobe mass and mediastinal adenopathy    Assessment:  .  Right lower lobe mass  .  Mediastinal adenopathy  .  Reviewed patient's records in detail.  PET scan reviewed CT reviewed  .  Hyponatremia likely related to suspected lung cancer  .  Anemia  Plan/Recommendations:  .  This is likely primary lung cancer  . Plan is bronchoscopy with EBUS  Procedure discussed with patient patient in agreement to undergo bronchoscopy   Sherrilyn Rist MD [facility] 05/11/2019, 9:26 AM  CC: No ref. provider found

## 2019-05-11 NOTE — Anesthesia Preprocedure Evaluation (Signed)
Anesthesia Evaluation  Patient identified by MRN, date of birth, ID band Patient awake    Reviewed: Allergy & Precautions, H&P , NPO status , Patient's Chart, lab work & pertinent test results, reviewed documented beta blocker date and time   Airway Mallampati: II  TM Distance: >3 FB Neck ROM: full    Dental no notable dental hx.    Pulmonary neg pulmonary ROS, former smoker,    Pulmonary exam normal breath sounds clear to auscultation       Cardiovascular Exercise Tolerance: Good negative cardio ROS   Rhythm:regular Rate:Normal     Neuro/Psych negative neurological ROS  negative psych ROS   GI/Hepatic negative GI ROS, Neg liver ROS,   Endo/Other  Hypothyroidism   Renal/GU negative Renal ROS  negative genitourinary   Musculoskeletal negative musculoskeletal ROS (+)   Abdominal   Peds  Hematology  (+) Blood dyscrasia, anemia ,   Anesthesia Other Findings   Reproductive/Obstetrics negative OB ROS                             Anesthesia Physical Anesthesia Plan  ASA: III  Anesthesia Plan: General   Post-op Pain Management:    Induction: Intravenous  PONV Risk Score and Plan: 3  Airway Management Planned: Oral ETT and LMA  Additional Equipment:   Intra-op Plan:   Post-operative Plan: Extubation in OR  Informed Consent: I have reviewed the patients History and Physical, chart, labs and discussed the procedure including the risks, benefits and alternatives for the proposed anesthesia with the patient or authorized representative who has indicated his/her understanding and acceptance.     Dental Advisory Given  Plan Discussed with: CRNA and Anesthesiologist  Anesthesia Plan Comments: (  )        Anesthesia Quick Evaluation

## 2019-05-11 NOTE — H&P (Signed)
Please see consult note by Dr. Ander Slade.

## 2019-05-11 NOTE — Op Note (Signed)
Video Bronchoscopy with Endobronchial Ultrasound Procedure Note  Date of Operation: 05/11/2019  Pre-op Diagnosis: Right lower lobe mass, mediastinal adenopathy  Post-op Diagnosis: Right lower lobe mass, mediastinal adenopathy  Surgeon: Sherrilyn Rist   Assistants: Dr. Erskine Emery  Anesthesia: General endotracheal anesthesia  Operation: Flexible video fiberoptic bronchoscopy with endobronchial ultrasound and biopsies.  Estimated Blood Loss: Minimal  Complications: None  Indications and History: [name] is a 74 y.o. female with right lower lobe mass.  The risks, benefits, complications, treatment options and expected outcomes were discussed with the patient.  The possibilities of pneumothorax, pneumonia, reaction to medication, pulmonary aspiration, perforation of a viscus, bleeding, failure to diagnose a condition and creating a complication requiring transfusion or operation were discussed with the patient who freely signed the consent.    Description of Procedure: The patient was examined in the preoperative area and history and data from the preprocedure consultation were reviewed. It was deemed appropriate to proceed.  The patient was taken to endoscopy suite, identified as [name] and the procedure verified as Flexible Video Fiberoptic Bronchoscopy.  A Time Out was held and the above information confirmed. After being taken to the operating room general anesthesia was initiated and the patient  was orally intubated. The video fiberoptic bronchoscope was introduced via the endotracheal tube and a general inspection was performed which showed normal airways.  Bronchoalveolar lavage was performed in the right lower lobe superior segment.  The standard scope was then withdrawn and the endobronchial ultrasound was used to identify and characterize the peritracheal, hilar and bronchial lymph nodes. Inspection showed normal airways, enlarged 7, 4R, 11 R nodes. Using real-time  ultrasound guidance Wang needle biopsies were take from Station 7, 4 R, 11 R nodes and were sent for cytology. Wang needle biopsies from right lower lobe mass performed,.  Flexible bronchoscope was reintroduced-transbronchial biopsies right lower lobe mass performed.  The patient tolerated the procedure well without apparent complications. There was no significant blood loss. The bronchoscope was withdrawn. Anesthesia was reversed and the patient was taken to the PACU for recovery.   Samples: 1. Wang needle biopsies from 7 node 2. Wang needle biopsies from 4R node 3. Wang needle biopsies from 11 are node 4. Wang needle biopsies from right lower lobe mass 5.  Transbronchial biopsy right lower lobe mass  Plans:  The patient will be discharged from the PACU to home when recovered from anesthesia. We will review the cytology, pathology and microbiology results with the patient when they become available. Outpatient followup will be with Shoaib Siefker.    Sherrilyn Rist, MD [facility] [facility] Pager: [phone]

## 2019-05-11 NOTE — Discharge Instructions (Addendum)
Discharge home Call with any concerns

## 2019-05-11 NOTE — Anesthesia Postprocedure Evaluation (Signed)
Anesthesia Post Note  Patient: [name]  Procedure(s) Performed: VIDEO BRONCHOSCOPY WITH ENDOBRONCHIAL ULTRASOUND (N/A ) BRONCHIAL WASHINGS BIOPSY FINE NEEDLE ASPIRATION (FNA) LINEAR     Patient location during evaluation: PACU Anesthesia Type: General Level of consciousness: awake and alert Pain management: pain level controlled Vital Signs Assessment: post-procedure vital signs reviewed and stable Respiratory status: spontaneous breathing, nonlabored ventilation, respiratory function stable and patient connected to nasal cannula oxygen Cardiovascular status: blood pressure returned to baseline and stable Postop Assessment: no apparent nausea or vomiting Anesthetic complications: no    Last Vitals:  Vitals:   05/11/19 1120 05/11/19 1135  BP: (!) 151/50 (!) 147/51  Pulse: 87 81  Resp: 17 18  Temp:  (!) 36.1 C  SpO2: 92% 92%    Last Pain:  Vitals:   05/11/19 1135  TempSrc:   PainSc: 0-No pain                 Corie Vavra

## 2019-05-11 NOTE — Transfer of Care (Signed)
Immediate Anesthesia Transfer of Care Note  Patient: [name]  Procedure(s) Performed: VIDEO BRONCHOSCOPY WITH ENDOBRONCHIAL ULTRASOUND (N/A ) BRONCHIAL WASHINGS BIOPSY FINE NEEDLE ASPIRATION (FNA) LINEAR  Patient Location: PACU  Anesthesia Type:General  Level of Consciousness: awake, alert  and oriented  Airway & Oxygen Therapy: Patient Spontanous Breathing  Post-op Assessment: Report given to RN and Post -op Vital signs reviewed and stable  Post vital signs: Reviewed and stable  Last Vitals:  Vitals Value Taken Time  BP 157/65 05/11/19 1107  Temp 36.1 C 05/11/19 1107  Pulse 90 05/11/19 1114  Resp 22 05/11/19 1114  SpO2 88 % 05/11/19 1114  Vitals shown include unvalidated device data.  Last Pain:  Vitals:   05/11/19 1107  TempSrc:   PainSc: 0-No pain      Patients Stated Pain Goal: 5 (72/89/79 1504)  Complications: No apparent anesthesia complications

## 2019-05-11 NOTE — Anesthesia Procedure Notes (Signed)
Procedure Name: Intubation Date/Time: 05/11/2019 9:26 AM Performed by: Wilburn Cornelia, CRNA Pre-anesthesia Checklist: Patient identified, Emergency Drugs available, Suction available, Patient being monitored and Timeout performed Patient Re-evaluated:Patient Re-evaluated prior to induction Oxygen Delivery Method: Circle system utilized Preoxygenation: Pre-oxygenation with 100% oxygen Induction Type: IV induction Ventilation: Mask ventilation without difficulty Laryngoscope Size: Mac and 3 Grade View: Grade II Tube type: Oral Tube size: 8.5 mm Number of attempts: 1 Airway Equipment and Method: Stylet Placement Confirmation: ETT inserted through vocal cords under direct vision,  positive ETCO2,  CO2 detector and breath sounds checked- equal and bilateral Secured at: 21 cm Tube secured with: Tape Dental Injury: Teeth and Oropharynx as per pre-operative assessment

## 2019-05-12 LAB — SURGICAL PATHOLOGY

## 2019-05-12 LAB — CYTOLOGY - NON PAP

## 2019-05-13 ENCOUNTER — Encounter: Payer: Self-pay | Admitting: *Deleted

## 2019-05-13 ENCOUNTER — Telehealth: Payer: Self-pay | Admitting: Internal Medicine

## 2019-05-13 DIAGNOSIS — R918 Other nonspecific abnormal finding of lung field: Secondary | ICD-10-CM

## 2019-05-13 LAB — CULTURE, RESPIRATORY W GRAM STAIN: Culture: NORMAL

## 2019-05-13 NOTE — Telephone Encounter (Signed)
Spoke with pt. She has been scheduled for a telephone visit with Aaron Edelman on 05/18/2019 at 0930.

## 2019-05-13 NOTE — Telephone Encounter (Signed)
Noted.  Please schedule the patient with me next week on Tuesday for a telephonic visit.  We will discuss case with Dr. Tamala Julian on Monday.  If Dr. Tamala Julian plans on contacting the patient directly himself and can cancel telephonic visit with me.Wyn Quaker, FNP

## 2019-05-13 NOTE — Telephone Encounter (Signed)
Spoke with Dr Lyndon Code.  Path from bronch done 05/11/19  pos for squamous cell CA.   I checked Liborio Nixon and Dr Tamala Julian is not listed so forwarding to APP of the day as well as Dr Tamala Julian to make aware.

## 2019-05-13 NOTE — Progress Notes (Signed)
Reviewed path report with Dr Maylon Peppers. He would like an urgent referral place to Thoracic Surgery to evaluate patient for possible further biopsies, or surgical intervention. Referral placed.   Called [place] to review results and further referral. She understood, but requested I call her daughter Abigail Butts and give her the information. Confirmed that Abigail Butts is on the Release of information. Called Abigail Butts and also reviewed information.  Called [facility] and spoke to [place]. Referral coordinators are out of the office. Patient will be scheduled Monday. Confirmed that the office received the referral. Sharee Pimple confirmed that they had it. Also informed Sharee Pimple of patient preference of Dr Servando Snare and to call her cell phone to schedule. She took down info.   Appointment for next week cancelled and will be rescheduled once we have definitive pathology.

## 2019-05-18 ENCOUNTER — Institutional Professional Consult (permissible substitution): Payer: Medicare Other | Admitting: Cardiothoracic Surgery

## 2019-05-18 ENCOUNTER — Ambulatory Visit: Payer: Medicare Other | Admitting: Pulmonary Disease

## 2019-05-18 ENCOUNTER — Encounter: Payer: Self-pay | Admitting: Cardiothoracic Surgery

## 2019-05-18 ENCOUNTER — Other Ambulatory Visit: Payer: Self-pay | Admitting: *Deleted

## 2019-05-18 ENCOUNTER — Other Ambulatory Visit: Payer: Self-pay

## 2019-05-18 VITALS — BP 157/93 | HR 77 | Temp 98.2°F | Resp 18 | Ht 59.5 in | Wt 116.0 lb

## 2019-05-18 DIAGNOSIS — R918 Other nonspecific abnormal finding of lung field: Secondary | ICD-10-CM

## 2019-05-18 NOTE — Pre-Procedure Instructions (Addendum)
   [name]  05/18/2019     [id number] - [place], [place] - [address] [place] [address] Phone: [phone] Fax: [phone]   Your procedure is scheduled on Friday, May 21, 2019  Report to [facility] Admitting at 5:30 A.M.  Call this number if you have problems the morning of surgery:  [phone]   Remember: Brush your teeth the morning of surgery.   Do not eat or drink after midnight the night before surgery.    Take these medicines the morning of surgery with A SIP OF WATER :   levothyroxine (SYNTHROID)    dorzolamide (TRUSOPT) eye drops  If needed: diphenhydrAMINE (BENADRYL) for allergic reaction  If needed:  famotidine (PEPCID) take with benadryl when stung  Stop taking Aspirin (unless otherwise advised by surgeon), vitamins, fish oil and herbal medications. Do not take any NSAIDs ie: Ibuprofen, Advil, Naproxen (Aleve), Motrin, BC and Goody Powder; stop now.    Do not wear jewelry, make-up or nail polish.  Do not wear lotions, powders, or perfumes, or deodorant.  Do not shave 48 hours prior to surgery.    Do not bring valuables to the hospital.  [facility] is not responsible for any belongings or valuables.  Contacts, dentures or bridgework may not be worn into surgery.  Leave your suitcase in the car.  After surgery it may be brought to your room. For patients admitted to the hospital, discharge time will be determined by your treatment team.  Patients discharged the day of surgery will not be allowed to drive home.   Special instructions: See " [facility] Preparing For Surgery" sheet (CHG wash).  Please read over the following fact sheets that you were given.  Pain Booklet, Coughing and Deep Breathing, MRSA Information and Surgical Site Infection Prevention

## 2019-05-18 NOTE — Progress Notes (Signed)
[address]       [place] [address]             [phone]                    [name] [facility] Medical Record #[id number] Date of Birth: 1945-08-22  Referring: Fay Records, MD Primary Care: Lawerance Cruel, MD Primary Cardiologist: No primary care provider on file.  Chief Complaint:    Chief Complaint  Patient presents with  . Lung Mass    RLLobe...CT CHEST 3/19/PET 3/29/BRONCH/EBUS 05/11/19.Marland KitchenMarland KitchenMRI BRAIN TOMORROW  . Adenopathy    Hilar/Paratracheal    History of Present Illness:    [name] 74 y.o. female is seen in the office  today for right lower lobe lung mass highly suggestive of malignancy.  The patient gives a history of nonproductive cough which has been going on since October 2020.  She had previously had vocal cord polyps and was evaluated for this and for reflux.  Because the cough continued chest x-ray was done that showed a right lower lobe lung mass in the 6 cm in size.  She was referred to pulmonary service and underwent CT of the chest PET scan bronchoscopy EBUS and biopsy by Dr. Ander Slade.  CT scan and PET scan suggested right hilar and mediastinal involvement, cytology of the results of enb showed only malignant cells consistent with squamous cell carcinoma and finally needle aspiration of the right lower lobe.  4R  7 , 10R ( assume path report has typo) needle aspirations showed no malignant cells.  Patient has had no pulmonary function studies but by CT has significant underlying emphysematous lung disease.  She is a former smoker for 40 years quitting approximately 10 years ago.  She notes that she does have a exercise tolerance to work around her house in the yard.  She denies any previous cardiac history denies hypertension hyperlipidemia diabetes history of stroke claudication or renal insufficiency.   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 By Dr Ander Slade Clinical History: None provided  FINAL MICROSCOPIC DIAGNOSIS:  B. LYMPH NODE, 4R, FINE NEEDLE ASPIRATION:  - No malignant cells identified.   C. LYMPH NODE, 7, FINE NEEDLE ASPIRATION:  - No malignant cells identified.   D. LYMPH NODE, 1R, FINE NEEDLE ASPIRATION:  - No malignant cells identified.   E. LUNG, RLL, FINE NEEDLE ASPIRATION:  - Malignant cells consistent with squamous cell carcinoma.  - See comment.   COMMENT:   Dr. Vic Ripper has reviewed part E and concurs with this interpretation.  There may be sufficient tumor present for additional studies. Dr. Tamala Julian  was paged on Jun 12, 2019.    SPECIMEN ADEQUACY:  B. Satisfactory for Evaluation  C. Satisfactory for Evaluation  D. Satisfactory for Evaluation  E. Satisfactory for Evaluation   Clinical History: mediastinal adenopathy (cm)   FINAL MICROSCOPIC DIAGNOSIS:   A. LUNG, RIGHT LOWER LOBE, TRANSBRONCHIAL BIOPSY:  - Lung parenchyma with inflammation and mild cytologic atypia.  - See comment.   COMMENT:   The biopsy is not definitively diagnostic of malignancy in my opinion.  Dr. Vicente Males has reviewed the case and concurs with this  interpretation. Please also see the patient's concurrent cytology  specimen, [id number].    Current Activity/ Functional Status:  Patient is independent with mobility/ambulation, transfers, ADL's, IADL's.   Zubrod Score: At the time of surgery this patient's most appropriate activity status/level should be described as: []     0    Normal activity, no symptoms [x]     1    Restricted in physical strenuous activity but ambulatory, able to do out light work []     2    Ambulatory and capable of self care, unable to do work activities, up and about               >50 % of  waking hours                              []     3    Only limited self care, in bed greater than 50% of waking hours []     4    Completely disabled, no self care, confined to bed or chair []     5    Moribund   Past Medical History:  Diagnosis Date  . Dyspnea   . Glaucoma   . Hypothyroidism   . Thyroid condition     Past Surgical History:  Procedure Laterality Date  . BIOPSY  05/11/2019   Procedure: BIOPSY;  Surgeon: Laurin Coder, MD;  Location: [facility];  Service: Pulmonary;;  . BRONCHIAL WASHINGS  05/11/2019   Procedure: BRONCHIAL WASHINGS;  Surgeon: Laurin Coder, MD;  Location: [facility];  Service: Pulmonary;;  . COLONOSCOPY    . FINE NEEDLE ASPIRATION  05/11/2019   Procedure: FINE NEEDLE ASPIRATION (FNA) LINEAR;  Surgeon: Laurin Coder, MD;  Location: [facility];  Service: Pulmonary;;  . SHOULDER SURGERY    . VIDEO BRONCHOSCOPY WITH ENDOBRONCHIAL ULTRASOUND N/A 05/11/2019   Procedure: VIDEO BRONCHOSCOPY WITH ENDOBRONCHIAL ULTRASOUND;  Surgeon: Laurin Coder, MD;  Location: [facility];  Service: Pulmonary;  Laterality: N/A;  . vocal cord polyp removal      No family history on file.   Social History   Tobacco Use  Smoking Status Former Smoker  . Types: Cigarettes  . Quit date: 02/13/2010  . Years since quitting: 9.2  Smokeless Tobacco Never Used  Tobacco Comment   Quit in 2011    Social History   Substance and Sexual Activity  Alcohol Use Yes   Comment: very rare, 1 glass of wine maybe every 3-4 weeks     Allergies  Allergen Reactions  . Timolol Nausea Only  . Bee Venom Swelling  . Brimonidine Nausea Only  . Hydrocodone Itching and Rash    Current Outpatient Medications  Medication Sig Dispense Refill  . Cholecalciferol (VITAMIN D) 50 MCG (2000 UT) tablet Take 2,000 Units by mouth daily with supper.    . diphenhydrAMINE (BENADRYL) 25 MG tablet Take 50 mg by mouth daily as needed (allergic reaction).    . dorzolamide (TRUSOPT)  2 % ophthalmic solution Place 1 drop into both eyes 2 (two) times daily.     . famotidine (PEPCID) 10 MG tablet Take 10 mg by mouth daily as needed (take with benadryl when stung).    Marland Kitchen levothyroxine (SYNTHROID) 88 MCG tablet Take 1 tablet (88 mcg total) by mouth See admin instructions. Take 44 mcg on Mon and Fri, Take 88 mcg on Sun, Tues, Wed, Thurs, and Sat 30 tablet 1  . travoprost, benzalkonium, (TRAVATAN) 0.004 % ophthalmic solution Place 1 drop into  both eyes at bedtime.       No current facility-administered medications for this visit.    Pertinent items are noted in HPI.   Review of Systems:     Cardiac Review of Systems: [Y] = yes  or   [ N ] = no   Chest Pain [ n   ]  Resting SOB [ n  ] Exertional SOB  Blue.Reese  ]  Orthopnea Florencio.Farrier  ]   Pedal Edema Florencio.Farrier   ]    Palpitations Florencio.Farrier  ] Syncope  Florencio.Farrier  ]   Presyncope [ n  ]   General Review of Systems: [Y] = yes [  ]=no Constitional: recent weight change [  ];  Wt loss over the last 3 months [   ] anorexia [  ]; fatigue [  ]; nausea [  ]; night sweats [  ]; fever [  ]; or chills [  ];           Eye : blurred vision [  ]; diplopia [   ]; vision changes [  ];  Amaurosis fugax[  ]; Resp: cough Blue.Reese  ];  wheezing[  ];  hemoptysis[  ]; shortness of breath[  ]; paroxysmal nocturnal dyspnea[  ]; dyspnea on exertion[  ]; or orthopnea[  ];  GI:  gallstones[  ], vomiting[  ];  dysphagia[  ]; melena[  ];  hematochezia [  ]; heartburn[  ];   Hx of  Colonoscopy[  ]; GU: kidney stones [  ]; hematuria[  ];   dysuria [  ];  nocturia[  ];  history of     obstruction [  ]; urinary frequency [  ]             Skin: rash, swelling[  ];, hair loss[  ];  peripheral edema[  ];  or itching[  ]; Musculosketetal: myalgias[  ];  joint swelling[  ];  joint erythema[  ];  joint pain[  ];  back pain[  ];  Heme/Lymph: bruising[  ];  bleeding[  ];  anemia[  ];  Neuro: TIA[  ];  headaches[  ];  stroke[  ];  vertigo[  ];  seizures[  ];   paresthesias[  ];  difficulty walking[   ];  Psych:depression[  ]; anxiety[  ];  Endocrine: diabetes[  ];  thyroid dysfunction[ y ];  Immunizations: Flu up to date [ y ]; Pneumococcal up to date [?  ];  Other:     PHYSICAL EXAMINATION: BP (!) 157/93 (BP Location: Left Arm, Patient Position: Sitting, Cuff Size: Normal)   Pulse 77   Temp 98.2 F (36.8 C)   Resp 18   Ht 4' 11.5" (1.511 m)   Wt 116 lb (52.6 kg)   SpO2 95% Comment: RA  BMI 23.04 kg/m  General appearance: alert, cooperative and no distress Head: Normocephalic, without obvious abnormality, atraumatic Neck: no adenopathy, no carotid bruit, no JVD, supple, symmetrical, trachea midline and thyroid not enlarged, symmetric, no tenderness/mass/nodules Lymph nodes: Cervical, supraclavicular, and axillary nodes normal. Resp: clear to auscultation bilaterally Cardio: regular rate and rhythm, S1, S2 normal, no murmur, click, rub or gallop GI: soft, non-tender; bowel sounds normal; no masses,  no organomegaly Extremities: extremities normal, atraumatic, no cyanosis or edema Neurologic: Grossly normal  Diagnostic Studies & Laboratory data:     Recent Radiology Findings:   DG Chest 2 View  Result Date: 04/29/2019 CLINICAL DATA:  Nonproductive cough for 6 months. Shortness  of breath. Former smoker. EXAM: CHEST - 2 VIEW COMPARISON:  03/05/2013 FINDINGS: The heart size and mediastinal contours are within normal limits. An irregular masslike opacity is seen in the posterior right lower lobe measuring approximately 8 cm, suspicious for bronchogenic carcinoma. Left lung is clear. No evidence of pleural effusion. IMPRESSION: Irregular masslike opacity in posterior right lower lobe, highly suspicious for bronchogenic carcinoma. Chest CT with contrast is recommended further evaluation. These results will be called to the ordering clinician or representative by the Radiologist Assistant, and communication documented in the PACS or [employer]. Electronically Signed   By: Marlaine Hind M.D.   On: 04/29/2019 10:22   CT CHEST W CONTRAST  Result Date: 04/30/2019 CLINICAL DATA:  Cough. Lung mass right lower lobe lesion seen on chest x-ray. EXAM: CT CHEST WITH CONTRAST TECHNIQUE: Multidetector CT imaging of the chest was performed during intravenous contrast administration. CONTRAST:  71mL ISOVUE-300 IOPAMIDOL (ISOVUE-300) INJECTION 61% COMPARISON:  Chest radiograph 04/29/2019. Chest CTA 03/05/2013 FINDINGS: Cardiovascular: Calcified aortic atherosclerosis. Mild aortic tortuosity without aneurysm. No aortic dissection. Heart is normal in size. No pericardial effusion. There are coronary artery calcifications. Evaluation on tailored for pulmonary embolus evaluation, however there are no filling defects in the pulmonary arteries to the distal lobar/proximal segmental level. Mediastinum/Nodes: Multiple prominent mildly enlarged right hilar lymph nodes including a 13 mm node series 2, image 70 and 11 mm node series 2, image 83. Additional small infrahilar nodes extend along the bronchovascular bundle. There is an 11 mm lower paratracheal node. Small upper mediastinal nodes measuring up to 7 mm. No left hilar adenopathy. No visualized thyroid nodule. No esophageal wall thickening. Lungs/Pleura: Heterogeneous right lower lobe pulmonary mass measures 6.9 x 4.6 x 5.1 cm with central hypodensity suspicious for necrosis. Margins are irregular and spiculated with surrounding ground-glass opacity. Posterior extent extends to the pleura. Irregular spiculations extend to the minor fissure. Moderate emphysema. No additional pulmonary nodule or mass. Minimal scarring in the anterior lingula. Trace right pleural thickening without frank effusion. Upper Abdomen: Slight left adrenal thickening without dominant nodule. Normal right adrenal gland. No evidence of focal hepatic lesion. The included spleen, pancreas, kidneys, stomach and colon are unremarkable. Atherosclerosis of upper abdominal aorta.  Musculoskeletal: No destructive lytic or focal blastic osseous lesions. No chest wall lesions. IMPRESSION: 1. Heterogeneous right lower lobe pulmonary mass measuring 6.9 x 4.6 x 5.1 cm, highly concerning for primary bronchogenic malignancy. Margins are irregular with central hypodensity/necrosis. 2. Right hilar and lower paratracheal adenopathy, suspicious for nodal metastatic disease. 3. Moderate emphysema. Aortic Atherosclerosis (ICD10-I70.0) and Emphysema (ICD10-J43.9). These results will be called to the ordering clinician or representative by the Radiologist Assistant, and communication documented in the PACS or [employer]. Electronically Signed   By: Keith Rake M.D.   On: 04/30/2019 15:38   NM PET Image Initial (PI) Skull Base To Thigh  Result Date: 05/10/2019 CLINICAL DATA:  Initial treatment strategy for lung cancer. EXAM: NUCLEAR MEDICINE PET SKULL BASE TO THIGH TECHNIQUE: 6.3 mCi F-18 FDG was injected intravenously. Full-ring PET imaging was performed from the skull base to thigh after the radiotracer. CT data was obtained and used for attenuation correction and anatomic localization. Fasting blood glucose: 98 mg/dl COMPARISON:  CT chest 04/30/2019 FINDINGS: Mediastinal blood pool activity: SUV max 2.0 Liver activity: SUV max NA NECK: Diffuse bilateral thyroid activity suggesting low-grade thyroiditis. There is no appreciable nodule to measure in order to provide a nodule size. No imaging follow up recommended. (Ref: J Am  Coll Radiol. 2015 Feb;12(2): 143-50). Incidental CT findings: Bilateral common carotid atherosclerotic calcification. CHEST: The right lower lobe mass measures 6.6 by 4.5 cm on image 43/8 demonstrates central necrosis and a maximum SUV of 18.7. Subcarinal node 0.9 cm in short axis on image 72/4, maximum SUV 5.6, compatible with malignant involvement. Indistinct right hilar and infrahilar adenopathy with maximum SUV 5.0, compatible with malignant involvement. A precarinal  lower paratracheal node measuring 0.8 cm in short axis on image 68/4 has a maximum SUV of 3.9 which is above blood pool and could be an indicator of malignant involvement although is less certain in the other nodal stations. Incidental CT findings: Centrilobular emphysema. Atherosclerotic calcification of the aortic arch and branch vessels. Descending thoracic aortic atherosclerosis. ABDOMEN/PELVIS: Subtle nodularity of the left adrenal gland without discrete mass, maximum SUV 2.8, no compelling findings of adrenal malignancy. Activity along the perineum without CT abnormality, compatible with urinary FDG and mild incontinence. Incidental CT findings: Aortoiliac atherosclerotic vascular disease. Mildly accentuated density centrally in the uterus, nonspecific but possibly from a uterine fibroid, an without accentuated metabolic activity. SKELETON: No significant abnormal hypermetabolic activity in this region. Incidental CT findings: none IMPRESSION: 1. The 6.6 cm right lower lobe mass has a maximum SUV of 18.7, compatible with malignancy. Small hypermetabolic subcarinal and right hilar/infrahilar nodes compatible with malignant involvement. Precarinal/lower paratracheal node measuring 0.8 cm in short axis has low-grade but above blood pool activity and could well represent early involvement. No findings of metastatic disease outside of the chest. 2. Aortic Atherosclerosis (ICD10-I70.0) and Emphysema (ICD10-J43.9). Electronically Signed   By: Van Clines M.D.   On: 05/10/2019 15:46       I have independently reviewed the above radiology studies  and reviewed the findings with the patient.   Recent Lab Findings: Lab Results  Component Value Date   WBC 5.9 05/11/2019   HGB 12.2 05/11/2019   HCT 39.3 05/11/2019   PLT 323 05/11/2019   GLUCOSE 84 05/11/2019   ALT 23 05/11/2019   AST 17 05/11/2019   NA 134 (L) 05/11/2019   K 4.5 05/11/2019   CL 98 05/11/2019   CREATININE 0.41 (L) 05/11/2019    BUN 12 05/11/2019   CO2 22 05/11/2019   TSH 6.051 (H) 03/06/2010   INR 1.0 05/11/2019      Assessment / Plan:   #1 patient with large squamous cell carcinoma of the lung - right lower lobe with suspicious lymph nodes from mediastinal nodal involvement but without tissue confirmation following bronchoscopy and EBUS.-With current clinical findings this could be stage IIIb versus  IIB #2 underlying emphysematous lung changes- no formal evaluation with pulmonary function studies- will obtain #3 as MRI of the brain scheduled for tomorrow-no clinical signs or symptoms of brain metastasis  I have discussed with the patient and her daughterradiographic findings and have recommended that we proceed with repeat bronchoscopy with EBUS possible mediastinoscopy later this week.    Risks and options of surgery have been discussed.   Grace Isaac MD      [address] [place] [address] Office [phone]     05/18/2019 4:30 PM

## 2019-05-19 ENCOUNTER — Encounter (HOSPITAL_COMMUNITY): Payer: Self-pay

## 2019-05-19 ENCOUNTER — Other Ambulatory Visit (HOSPITAL_COMMUNITY)
Admission: RE | Admit: 2019-05-19 | Discharge: 2019-05-19 | Disposition: A | Discharge status: Home / Self Care | Payer: Medicare Other | Source: Ambulatory Visit | Attending: Cardiothoracic Surgery | Admitting: Cardiothoracic Surgery

## 2019-05-19 ENCOUNTER — Encounter (HOSPITAL_COMMUNITY)
Admission: RE | Admit: 2019-05-19 | Discharge: 2019-05-19 | Disposition: A | Discharge status: Home / Self Care | Payer: Medicare Other | Source: Ambulatory Visit | Attending: Cardiothoracic Surgery | Admitting: Cardiothoracic Surgery

## 2019-05-19 ENCOUNTER — Other Ambulatory Visit: Payer: Self-pay

## 2019-05-19 ENCOUNTER — Ambulatory Visit (HOSPITAL_COMMUNITY)
Admission: RE | Admit: 2019-05-19 | Discharge: 2019-05-19 | Disposition: A | Discharge status: Home / Self Care | Payer: Medicare Other | Source: Ambulatory Visit | Attending: Hematology | Admitting: Hematology

## 2019-05-19 ENCOUNTER — Ambulatory Visit (HOSPITAL_COMMUNITY)
Admission: RE | Admit: 2019-05-19 | Discharge: 2019-05-19 | Disposition: A | Discharge status: Home / Self Care | Payer: Medicare Other | Source: Ambulatory Visit | Attending: Cardiothoracic Surgery | Admitting: Cardiothoracic Surgery

## 2019-05-19 DIAGNOSIS — Z01818 Encounter for other preprocedural examination: Secondary | ICD-10-CM | POA: Insufficient documentation

## 2019-05-19 DIAGNOSIS — Z20822 Contact with and (suspected) exposure to covid-19: Secondary | ICD-10-CM | POA: Diagnosis not present

## 2019-05-19 DIAGNOSIS — R918 Other nonspecific abnormal finding of lung field: Secondary | ICD-10-CM | POA: Diagnosis not present

## 2019-05-19 DIAGNOSIS — R9431 Abnormal electrocardiogram [ECG] [EKG]: Secondary | ICD-10-CM | POA: Insufficient documentation

## 2019-05-19 DIAGNOSIS — J449 Chronic obstructive pulmonary disease, unspecified: Secondary | ICD-10-CM | POA: Insufficient documentation

## 2019-05-19 HISTORY — DX: Other nonspecific abnormal finding of lung field: R91.8

## 2019-05-19 HISTORY — DX: Headache, unspecified: R51.9

## 2019-05-19 LAB — COMPREHENSIVE METABOLIC PANEL
ALT: 32 U/L (ref 0–44)
AST: 15 U/L (ref 15–41)
Albumin: 3.5 g/dL (ref 3.5–5.0)
Alkaline Phosphatase: 113 U/L (ref 38–126)
Anion gap: 10 (ref 5–15)
BUN: 8 mg/dL (ref 8–23)
CO2: 26 mmol/L (ref 22–32)
Calcium: 9 mg/dL (ref 8.9–10.3)
Chloride: 94 mmol/L — ABNORMAL LOW (ref 98–111)
Creatinine, Ser: 0.52 mg/dL (ref 0.44–1.00)
GFR calc Af Amer: >60 mL/min (ref >60)
GFR calc non Af Amer: >60 mL/min (ref >60)
Glucose, Bld: 94 mg/dL (ref 70–99)
Potassium: 3.8 mmol/L (ref 3.5–5.1)
Sodium: 130 mmol/L — ABNORMAL LOW (ref 135–145)
Total Bilirubin: 0.5 mg/dL (ref 0.3–1.2)
Total Protein: 7.2 g/dL (ref 6.5–8.1)

## 2019-05-19 LAB — CBC
HCT: 36.7 % (ref 36.0–46.0)
Hemoglobin: 11.4 g/dL — ABNORMAL LOW (ref 12.0–15.0)
MCH: 27.7 pg (ref 26.0–34.0)
MCHC: 31.1 g/dL (ref 30.0–36.0)
MCV: 89.1 fL (ref 80.0–100.0)
Platelets: 388 10*3/uL (ref 150–400)
RBC: 4.12 MIL/uL (ref 3.87–5.11)
RDW: 13.7 % (ref 11.5–15.5)
WBC: 5.5 10*3/uL (ref 4.0–10.5)
nRBC: 0 % (ref 0.0–0.2)

## 2019-05-19 LAB — SURGICAL PCR SCREEN
MRSA, PCR: NEGATIVE
Staphylococcus aureus: POSITIVE — AB

## 2019-05-19 LAB — ABO/RH: ABO/RH(D): A POS

## 2019-05-19 LAB — PROTIME-INR
INR: 1.1 (ref 0.8–1.2)
Prothrombin Time: 13.7 seconds (ref 11.4–15.2)

## 2019-05-19 LAB — TYPE AND SCREEN
ABO/RH(D): A POS
Antibody Screen: NEGATIVE

## 2019-05-19 LAB — APTT: aPTT: 32 seconds (ref 24–36)

## 2019-05-19 LAB — SARS CORONAVIRUS 2 (TAT 6-24 HRS): SARS Coronavirus 2: NEGATIVE

## 2019-05-19 MED ORDER — GADOBUTROL 1 MMOL/ML IV SOLN
5.0000 mL | Freq: Once | INTRAVENOUS | Status: AC | PRN
  Administered 2019-05-19: 5 mL via INTRAVENOUS

## 2019-05-19 NOTE — Progress Notes (Signed)
Pt chart forwarded to PA, Anesthesiology, to review abnormal EKG.

## 2019-05-19 NOTE — Progress Notes (Signed)
Pt denies any acute pulmonary issues. Pt denies chest pain and being under the care of a cardiologist. Pt stated that she sees Dr. Ina Homes, Pulmonology and Dr. Lona Kettle, PCP. Pt denies having a cardiac cath and echo. Pt denies having a recent chest x ray . Pt denies having an EKG in the last month. Pt reminded to quarantine. Pt verbalized understanding of all pre-op instructions.

## 2019-05-20 ENCOUNTER — Encounter: Payer: Self-pay | Admitting: *Deleted

## 2019-05-20 ENCOUNTER — Other Ambulatory Visit: Payer: Self-pay | Admitting: Internal Medicine

## 2019-05-20 ENCOUNTER — Other Ambulatory Visit: Payer: Medicare Other

## 2019-05-20 ENCOUNTER — Ambulatory Visit: Payer: Medicare Other | Admitting: Hematology

## 2019-05-20 ENCOUNTER — Ambulatory Visit (HOSPITAL_COMMUNITY)
Admission: RE | Admit: 2019-05-20 | Discharge: 2019-05-20 | Disposition: A | Discharge status: Home / Self Care | Payer: Medicare Other | Source: Ambulatory Visit | Attending: Cardiothoracic Surgery | Admitting: Cardiothoracic Surgery

## 2019-05-20 DIAGNOSIS — R918 Other nonspecific abnormal finding of lung field: Secondary | ICD-10-CM | POA: Diagnosis not present

## 2019-05-20 LAB — PULMONARY FUNCTION TEST
DL/VA % pred: 86 %
DL/VA % pred: 86 %
DL/VA: 3.69 ml/min/mmHg/L
DL/VA: 3.69 ml/min/mmHg/L
DLCO cor % pred: 66 %
DLCO cor % pred: 66 %
DLCO cor: 10.72 ml/min/mmHg
DLCO cor: 10.72 ml/min/mmHg
DLCO unc % pred: 62 %
DLCO unc % pred: 62 %
DLCO unc: 10 ml/min/mmHg
DLCO unc: 10 ml/min/mmHg
FEF 25-75 Post: 0.51 L/sec
FEF 25-75 Post: 0.51 L/sec
FEF 25-75 Pre: 0.38 L/sec
FEF 25-75 Pre: 0.38 L/sec
FEF2575-%Change-Post: 32 %
FEF2575-%Change-Post: 32 %
FEF2575-%Pred-Post: 35 %
FEF2575-%Pred-Post: 35 %
FEF2575-%Pred-Pre: 26 %
FEF2575-%Pred-Pre: 26 %
FEV1-%Change-Post: 16 %
FEV1-%Change-Post: 16 %
FEV1-%Pred-Post: 60 %
FEV1-%Pred-Post: 60 %
FEV1-%Pred-Pre: 52 %
FEV1-%Pred-Pre: 52 %
FEV1-Post: 1.02 L
FEV1-Post: 1.02 L
FEV1-Pre: 0.88 L
FEV1-Pre: 0.88 L
FEV1FVC-%Change-Post: 8 %
FEV1FVC-%Change-Post: 8 %
FEV1FVC-%Pred-Pre: 70 %
FEV1FVC-%Pred-Pre: 70 %
FEV6-%Change-Post: 2 %
FEV6-%Change-Post: 2 %
FEV6-%Pred-Post: 79 %
FEV6-%Pred-Post: 79 %
FEV6-%Pred-Pre: 77 %
FEV6-%Pred-Pre: 77 %
FEV6-Post: 1.7 L
FEV6-Post: 1.7 L
FEV6-Pre: 1.66 L
FEV6-Pre: 1.66 L
FEV6FVC-%Change-Post: -3 %
FEV6FVC-%Change-Post: -3 %
FEV6FVC-%Pred-Post: 101 %
FEV6FVC-%Pred-Post: 101 %
FEV6FVC-%Pred-Pre: 105 %
FEV6FVC-%Pred-Pre: 105 %
FVC-%Change-Post: 6 %
FVC-%Change-Post: 6 %
FVC-%Pred-Post: 78 %
FVC-%Pred-Post: 78 %
FVC-%Pred-Pre: 73 %
FVC-%Pred-Pre: 73 %
FVC-Post: 1.77 L
FVC-Post: 1.77 L
FVC-Pre: 1.66 L
Post FEV1/FVC ratio: 58 %
Post FEV1/FVC ratio: 58 %
Post FEV6/FVC ratio: 96 %
Post FEV6/FVC ratio: 96 %
Pre FEV1/FVC ratio: 53 %
Pre FEV1/FVC ratio: 53 %
Pre FEV6/FVC Ratio: 100 %
Pre FEV6/FVC Ratio: 100 %
RV % pred: 398 %
RV % pred: 398 %
RV: 8.03 L
RV: 8.03 L
TLC % pred: 220 %
TLC % pred: 220 %
TLC: 9.47 L
TLC: 9.47 L

## 2019-05-20 LAB — CYTOLOGY - NON PAP

## 2019-05-20 MED ORDER — ALBUTEROL SULFATE (2.5 MG/3ML) 0.083% IN NEBU
2.5000 mg | INHALATION_SOLUTION | Freq: Once | RESPIRATORY_TRACT | Status: AC
  Administered 2019-05-20: 2.5 mg via RESPIRATORY_TRACT

## 2019-05-20 NOTE — Progress Notes (Signed)
Per Dr Maylon Peppers - Can you let Ms. Kobel know that her MRI brain was negative?   Patient notified of result.

## 2019-05-20 NOTE — Anesthesia Preprocedure Evaluation (Addendum)
Anesthesia Evaluation  Patient identified by MRN, date of birth, ID band Patient awake    Reviewed: Allergy & Precautions  Airway Mallampati: II  TM Distance: >3 FB     Dental   Pulmonary former smoker,    breath sounds clear to auscultation       Cardiovascular negative cardio ROS   Rhythm:Regular Rate:Normal     Neuro/Psych    GI/Hepatic negative GI ROS, Neg liver ROS,   Endo/Other  Hypothyroidism   Renal/GU negative Renal ROS     Musculoskeletal   Abdominal   Peds  Hematology   Anesthesia Other Findings   Reproductive/Obstetrics                            Anesthesia Physical Anesthesia Plan  ASA: III  Anesthesia Plan: General   Post-op Pain Management:    Induction: Intravenous  PONV Risk Score and Plan: 2 and Ondansetron, Dexamethasone and Midazolam  Airway Management Planned: Oral ETT  Additional Equipment:   Intra-op Plan:   Post-operative Plan: Possible Post-op intubation/ventilation  Informed Consent: I have reviewed the patients History and Physical, chart, labs and discussed the procedure including the risks, benefits and alternatives for the proposed anesthesia with the patient or authorized representative who has indicated his/her understanding and acceptance.     Dental advisory given  Plan Discussed with: Anesthesiologist and CRNA  Anesthesia Plan Comments:        Anesthesia Quick Evaluation

## 2019-05-21 ENCOUNTER — Encounter: Payer: Self-pay | Admitting: *Deleted

## 2019-05-21 ENCOUNTER — Ambulatory Visit (HOSPITAL_COMMUNITY)
Admission: RE | Admit: 2019-05-21 | Discharge: 2019-05-21 | Disposition: A | Discharge status: Home / Self Care | Payer: Medicare Other | Source: Ambulatory Visit | Attending: Cardiothoracic Surgery | Admitting: Cardiothoracic Surgery

## 2019-05-21 ENCOUNTER — Encounter (HOSPITAL_COMMUNITY): Payer: Self-pay | Admitting: Cardiothoracic Surgery

## 2019-05-21 ENCOUNTER — Ambulatory Visit (HOSPITAL_COMMUNITY): Payer: Medicare Other | Admitting: Physician Assistant

## 2019-05-21 ENCOUNTER — Encounter (HOSPITAL_COMMUNITY)
Admission: RE | Disposition: A | Discharge status: Home / Self Care | Payer: Self-pay | Source: Ambulatory Visit | Attending: Cardiothoracic Surgery

## 2019-05-21 ENCOUNTER — Other Ambulatory Visit: Payer: Self-pay | Admitting: Hematology

## 2019-05-21 ENCOUNTER — Ambulatory Visit (HOSPITAL_COMMUNITY): Payer: Medicare Other | Admitting: Anesthesiology

## 2019-05-21 DIAGNOSIS — C3491 Malignant neoplasm of unspecified part of right bronchus or lung: Secondary | ICD-10-CM

## 2019-05-21 DIAGNOSIS — Z87891 Personal history of nicotine dependence: Secondary | ICD-10-CM | POA: Diagnosis not present

## 2019-05-21 DIAGNOSIS — R222 Localized swelling, mass and lump, trunk: Secondary | ICD-10-CM | POA: Diagnosis not present

## 2019-05-21 DIAGNOSIS — R918 Other nonspecific abnormal finding of lung field: Secondary | ICD-10-CM | POA: Insufficient documentation

## 2019-05-21 DIAGNOSIS — J439 Emphysema, unspecified: Secondary | ICD-10-CM | POA: Insufficient documentation

## 2019-05-21 DIAGNOSIS — R599 Enlarged lymph nodes, unspecified: Secondary | ICD-10-CM | POA: Diagnosis not present

## 2019-05-21 DIAGNOSIS — C3431 Malignant neoplasm of lower lobe, right bronchus or lung: Secondary | ICD-10-CM | POA: Diagnosis not present

## 2019-05-21 DIAGNOSIS — E039 Hypothyroidism, unspecified: Secondary | ICD-10-CM | POA: Diagnosis not present

## 2019-05-21 DIAGNOSIS — R590 Localized enlarged lymph nodes: Secondary | ICD-10-CM | POA: Diagnosis not present

## 2019-05-21 HISTORY — PX: MEDIASTINOSCOPY: SHX5086

## 2019-05-21 HISTORY — PX: VIDEO BRONCHOSCOPY WITH ENDOBRONCHIAL ULTRASOUND: SHX6177

## 2019-05-21 SURGERY — BRONCHOSCOPY, WITH EBUS
Anesthesia: General

## 2019-05-21 MED ORDER — PHENYLEPHRINE 40 MCG/ML (10ML) SYRINGE FOR IV PUSH (FOR BLOOD PRESSURE SUPPORT)
PREFILLED_SYRINGE | INTRAVENOUS | Status: DC | PRN
  Administered 2019-05-21: 40 ug via INTRAVENOUS
  Administered 2019-05-21: 120 ug via INTRAVENOUS
  Administered 2019-05-21: 80 ug via INTRAVENOUS

## 2019-05-21 MED ORDER — ROCURONIUM BROMIDE 10 MG/ML (PF) SYRINGE
PREFILLED_SYRINGE | INTRAVENOUS | Status: AC
  Filled 2019-05-21: qty 10

## 2019-05-21 MED ORDER — FENTANYL CITRATE (PF) 250 MCG/5ML IJ SOLN
INTRAMUSCULAR | Status: AC
  Filled 2019-05-21: qty 5

## 2019-05-21 MED ORDER — PROPOFOL 10 MG/ML IV BOLUS
INTRAVENOUS | Status: AC
  Filled 2019-05-21: qty 20

## 2019-05-21 MED ORDER — SODIUM CHLORIDE 0.9 % IR SOLN: Status: DC | PRN

## 2019-05-21 MED ORDER — ONDANSETRON HCL 4 MG/2ML IJ SOLN
INTRAMUSCULAR | Status: AC
  Filled 2019-05-21: qty 2

## 2019-05-21 MED ORDER — TRAMADOL HCL 50 MG PO TABS: 50.0000 mg | ORAL_TABLET | Freq: Four times a day (QID) | ORAL | 0 refills | Status: AC | PRN

## 2019-05-21 MED ORDER — MIDAZOLAM HCL 5 MG/5ML IJ SOLN
INTRAMUSCULAR | Status: DC | PRN
  Administered 2019-05-21 (×2): 1 mg via INTRAVENOUS

## 2019-05-21 MED ORDER — FENTANYL CITRATE (PF) 250 MCG/5ML IJ SOLN
INTRAMUSCULAR | Status: DC | PRN
  Administered 2019-05-21 (×3): 50 ug via INTRAVENOUS

## 2019-05-21 MED ORDER — EPINEPHRINE PF 1 MG/ML IJ SOLN
INTRAMUSCULAR | Status: AC
  Filled 2019-05-21: qty 1

## 2019-05-21 MED ORDER — FENTANYL CITRATE (PF) 100 MCG/2ML IJ SOLN: 25.0000 ug | INTRAMUSCULAR | Status: DC | PRN

## 2019-05-21 MED ORDER — DEXAMETHASONE SODIUM PHOSPHATE 10 MG/ML IJ SOLN
INTRAMUSCULAR | Status: DC | PRN
  Administered 2019-05-21: 10 mg via INTRAVENOUS

## 2019-05-21 MED ORDER — PROPOFOL 10 MG/ML IV BOLUS
INTRAVENOUS | Status: DC | PRN
  Administered 2019-05-21: 100 mg via INTRAVENOUS

## 2019-05-21 MED ORDER — 0.9 % SODIUM CHLORIDE (POUR BTL) OPTIME
TOPICAL | Status: DC | PRN
  Administered 2019-05-21 (×2): 1000 mL

## 2019-05-21 MED ORDER — SUGAMMADEX SODIUM 200 MG/2ML IV SOLN
INTRAVENOUS | Status: DC | PRN
  Administered 2019-05-21: 120 mg via INTRAVENOUS

## 2019-05-21 MED ORDER — ROCURONIUM BROMIDE 10 MG/ML (PF) SYRINGE
PREFILLED_SYRINGE | INTRAVENOUS | Status: DC | PRN
  Administered 2019-05-21: 10 mg via INTRAVENOUS
  Administered 2019-05-21: 50 mg via INTRAVENOUS

## 2019-05-21 MED ORDER — LIDOCAINE 2% (20 MG/ML) 5 ML SYRINGE
INTRAMUSCULAR | Status: AC
  Filled 2019-05-21: qty 5

## 2019-05-21 MED ORDER — LACTATED RINGERS IV SOLN: INTRAVENOUS | Status: DC | PRN

## 2019-05-21 MED ORDER — MIDAZOLAM HCL 2 MG/2ML IJ SOLN
INTRAMUSCULAR | Status: AC
  Filled 2019-05-21: qty 2

## 2019-05-21 MED ORDER — PHENYLEPHRINE HCL-NACL 10-0.9 MG/250ML-% IV SOLN
INTRAVENOUS | Status: DC | PRN
  Administered 2019-05-21: 10 ug/min via INTRAVENOUS

## 2019-05-21 MED ORDER — HEMOSTATIC AGENTS (NO CHARGE) OPTIME
TOPICAL | Status: DC | PRN
  Administered 2019-05-21: 1 via TOPICAL

## 2019-05-21 MED ORDER — LIDOCAINE 2% (20 MG/ML) 5 ML SYRINGE
INTRAMUSCULAR | Status: DC | PRN
  Administered 2019-05-21: 100 mg via INTRAVENOUS

## 2019-05-21 MED ORDER — DEXAMETHASONE SODIUM PHOSPHATE 10 MG/ML IJ SOLN
INTRAMUSCULAR | Status: AC
  Filled 2019-05-21: qty 1

## 2019-05-21 MED ORDER — PHENYLEPHRINE 40 MCG/ML (10ML) SYRINGE FOR IV PUSH (FOR BLOOD PRESSURE SUPPORT)
PREFILLED_SYRINGE | INTRAVENOUS | Status: AC
  Filled 2019-05-21: qty 10

## 2019-05-21 MED ORDER — ONDANSETRON HCL 4 MG/2ML IJ SOLN
INTRAMUSCULAR | Status: DC | PRN
  Administered 2019-05-21: 4 mg via INTRAVENOUS

## 2019-05-21 MED ORDER — CEFAZOLIN SODIUM-DEXTROSE 2-4 GM/100ML-% IV SOLN
2.0000 g | INTRAVENOUS | Status: AC
  Administered 2019-05-21: 2 g via INTRAVENOUS
  Filled 2019-05-21: qty 100

## 2019-05-21 SURGICAL SUPPLY — 70 items
ADAPTER VALVE BIOPSY EBUS (MISCELLANEOUS) ×1 IMPLANT
ADPTR VALVE BIOPSY EBUS (MISCELLANEOUS) ×2
BLADE 15 SAFETY STRL DISP (BLADE) ×2 IMPLANT
BLADE CLIPPER SURG (BLADE) IMPLANT
BLADE SURG 10 STRL SS (BLADE) ×2 IMPLANT
BRUSH CYTOL CELLEBRITY 1.5X140 (MISCELLANEOUS) IMPLANT
CANISTER SUCT 3000ML PPV (MISCELLANEOUS) ×2 IMPLANT
CLIP VESOCCLUDE MED 6/CT (CLIP) IMPLANT
CNTNR URN SCR LID CUP LEK RST (MISCELLANEOUS) ×4 IMPLANT
CONT SPEC 4OZ STRL OR WHT (MISCELLANEOUS) ×8
COVER BACK TABLE 60X90IN (DRAPES) ×2 IMPLANT
COVER SURGICAL LIGHT HANDLE (MISCELLANEOUS) IMPLANT
DERMABOND ADVANCED (GAUZE/BANDAGES/DRESSINGS) ×1
DERMABOND ADVANCED .7 DNX12 (GAUZE/BANDAGES/DRESSINGS) ×1 IMPLANT
DRAPE LAPAROTOMY T 102X78X121 (DRAPES) ×2 IMPLANT
DRSG AQUACEL AG ADV 3.5X14 (GAUZE/BANDAGES/DRESSINGS) IMPLANT
ELECT BLADE 4.0 EZ CLEAN MEGAD (MISCELLANEOUS) ×2
ELECT CAUTERY BLADE 6.4 (BLADE) IMPLANT
ELECT REM PT RETURN 9FT ADLT (ELECTROSURGICAL) ×2
ELECTRODE BLDE 4.0 EZ CLN MEGD (MISCELLANEOUS) ×1 IMPLANT
ELECTRODE REM PT RTRN 9FT ADLT (ELECTROSURGICAL) ×1 IMPLANT
FILTER SMOKE EVAC ULPA (FILTER) IMPLANT
FORCEPS BIOP RJ4 1.8 (CUTTING FORCEPS) IMPLANT
FORCEPS BIOP SPYBITE 1.2X286 (FORCEP) IMPLANT
GAUZE 4X4 16PLY RFD (DISPOSABLE) ×2 IMPLANT
GAUZE SPONGE 4X4 12PLY STRL (GAUZE/BANDAGES/DRESSINGS) ×2 IMPLANT
GLOVE BIO SURGEON STRL SZ 6.5 (GLOVE) ×6 IMPLANT
GLOVE BIO SURGEON STRL SZ7.5 (GLOVE) ×4 IMPLANT
GOWN STRL REUS W/ TWL LRG LVL3 (GOWN DISPOSABLE) ×4 IMPLANT
GOWN STRL REUS W/TWL 2XL LVL3 (GOWN DISPOSABLE) ×4 IMPLANT
GOWN STRL REUS W/TWL LRG LVL3 (GOWN DISPOSABLE) ×8
HEMOSTAT SURGICEL 2X14 (HEMOSTASIS) ×2 IMPLANT
KIT BASIN OR (CUSTOM PROCEDURE TRAY) ×2 IMPLANT
KIT CLEAN ENDO COMPLIANCE (KITS) ×4 IMPLANT
KIT TURNOVER KIT B (KITS) ×2 IMPLANT
MARKER SKIN DUAL TIP RULER LAB (MISCELLANEOUS) ×2 IMPLANT
NEEDLE ASPIRATION VIZISHOT 19G (NEEDLE) ×2 IMPLANT
NEEDLE ASPIRATION VIZISHOT 21G (NEEDLE) IMPLANT
NEEDLE FILTER BLUNT 18X 1/2SAF (NEEDLE) ×1
NEEDLE FILTER BLUNT 18X1 1/2 (NEEDLE) ×1 IMPLANT
NS IRRIG 1000ML POUR BTL (IV SOLUTION) ×4 IMPLANT
OIL SILICONE PENTAX (PARTS (SERVICE/REPAIRS)) ×2 IMPLANT
PACK SURGICAL SETUP 50X90 (CUSTOM PROCEDURE TRAY) IMPLANT
PAD ARMBOARD 7.5X6 YLW CONV (MISCELLANEOUS) ×4 IMPLANT
PENCIL BUTTON HOLSTER BLD 10FT (ELECTRODE) ×2 IMPLANT
PENCIL SMOKE EVACUATOR (MISCELLANEOUS) IMPLANT
SLEEVE SUCTION 125 (MISCELLANEOUS) IMPLANT
SPONGE INTESTINAL PEANUT (DISPOSABLE) ×2 IMPLANT
SPONGE LAP 18X18 RF (DISPOSABLE) ×2 IMPLANT
SPONGE LAP 4X18 RFD (DISPOSABLE) ×2 IMPLANT
STAPLER VISISTAT 35W (STAPLE) IMPLANT
SUT SILK 3 0 (SUTURE) ×2
SUT SILK 3-0 18XBRD TIE 12 (SUTURE) ×1 IMPLANT
SUT VIC AB 3-0 SH 18 (SUTURE) ×2 IMPLANT
SUT VICRYL 4-0 PS2 18IN ABS (SUTURE) ×2 IMPLANT
SWAB COLLECTION DEVICE MRSA (MISCELLANEOUS) IMPLANT
SWAB CULTURE ESWAB REG 1ML (MISCELLANEOUS) IMPLANT
SYR 10ML LL (SYRINGE) ×2 IMPLANT
SYR 20ML ECCENTRIC (SYRINGE) ×4 IMPLANT
SYR 20ML LL LF (SYRINGE) ×4 IMPLANT
TOWEL GREEN STERILE (TOWEL DISPOSABLE) ×2 IMPLANT
TOWEL GREEN STERILE FF (TOWEL DISPOSABLE) ×2 IMPLANT
TRAP FLUID SMOKE EVACUATOR (MISCELLANEOUS) IMPLANT
TRAP SPECIMEN MUCOUS 40CC (MISCELLANEOUS) ×2 IMPLANT
TUBE CONNECTING 12X1/4 (SUCTIONS) IMPLANT
TUBE CONNECTING 20X1/4 (TUBING) ×2 IMPLANT
VALVE BIOPSY  SINGLE USE (MISCELLANEOUS) ×6
VALVE BIOPSY SINGLE USE (MISCELLANEOUS) ×3 IMPLANT
VALVE SUCTION BRONCHIO DISP (MISCELLANEOUS) ×2 IMPLANT
WATER STERILE IRR 1000ML POUR (IV SOLUTION) ×2 IMPLANT

## 2019-05-21 NOTE — Anesthesia Procedure Notes (Signed)
Procedure Name: Intubation Date/Time: 05/21/2019 7:34 AM Performed by: Wilburn Cornelia, CRNA Pre-anesthesia Checklist: Patient identified, Emergency Drugs available, Suction available, Patient being monitored and Timeout performed Patient Re-evaluated:Patient Re-evaluated prior to induction Oxygen Delivery Method: Circle system utilized Preoxygenation: Pre-oxygenation with 100% oxygen Induction Type: IV induction Ventilation: Mask ventilation without difficulty Laryngoscope Size: Mac and 3 Grade View: Grade I Tube type: Oral Tube size: 8.5 mm Number of attempts: 1 Airway Equipment and Method: Stylet Placement Confirmation: ETT inserted through vocal cords under direct vision,  positive ETCO2,  CO2 detector and breath sounds checked- equal and bilateral Secured at: 21 cm Tube secured with: Tape Dental Injury: Teeth and Oropharynx as per pre-operative assessment

## 2019-05-21 NOTE — Anesthesia Postprocedure Evaluation (Signed)
Anesthesia Post Note  Patient: [name]  Procedure(s) Performed: VIDEO BRONCHOSCOPY WITH ENDOBRONCHIAL ULTRASOUND (N/A ) MEDIASTINOSCOPY (N/A )     Patient location during evaluation: PACU Anesthesia Type: General Level of consciousness: awake Pain management: pain level controlled Vital Signs Assessment: post-procedure vital signs reviewed and stable Respiratory status: spontaneous breathing Cardiovascular status: stable Postop Assessment: no apparent nausea or vomiting Anesthetic complications: no    Last Vitals:  Vitals:   05/21/19 1000 05/21/19 1015  BP: (!) 125/45 103/64  Pulse: 80 93  Resp: (!) 23 17  Temp: 36.7 C   SpO2: 97% 96%    Last Pain:  Vitals:   05/21/19 1000  TempSrc:   PainSc: 0-No pain                 [name]

## 2019-05-21 NOTE — Progress Notes (Signed)
Patient had EBUS today. Needs follow up with Dr Maylon Peppers to discuss treatment. Scheduled for next Tuesday. Also scheduled for chemo education that day.   Patient needs port placed. Scheduled for 05/27/19.  Called patient, however she was sleeping and her husband requested I call daughter. [name].  Called [name] and gave her all the Tuesday appointments. Patient's husband will join patient, but [name] will dial in. She asks that the chemo educator call her after education to review key points with her. Message sent to educator.   Also reviewed with [name] the appointment for port placement. Includes 12p arrival time, NPO after 7a and the need for a driver.   Several questions were asked about treatment times and anticipated schedules. Answered her questions to her satisfaction. She has my number to reach out if needed.

## 2019-05-21 NOTE — Transfer of Care (Signed)
Immediate Anesthesia Transfer of Care Note  Patient: [name]  Procedure(s) Performed: VIDEO BRONCHOSCOPY WITH ENDOBRONCHIAL ULTRASOUND (N/A ) MEDIASTINOSCOPY (N/A )  Patient Location: PACU  Anesthesia Type:General  Level of Consciousness: awake, alert  and oriented  Airway & Oxygen Therapy: Patient Spontanous Breathing and Patient connected to nasal cannula oxygen  Post-op Assessment: Report given to RN and Post -op Vital signs reviewed and stable  Post vital signs: Reviewed and stable  Last Vitals:  Vitals Value Taken Time  BP 125/45 05/21/19 1001  Temp    Pulse 78 05/21/19 1000  Resp 22 05/21/19 1000  SpO2 97 % 05/21/19 1000  Vitals shown include unvalidated device data.  Last Pain:  Vitals:   05/21/19 0623  TempSrc: Oral  PainSc:       Patients Stated Pain Goal: 3 (10/93/23 5573)  Complications: No apparent anesthesia complications

## 2019-05-21 NOTE — Brief Op Note (Signed)
      [address]       [place] [address]             [id number]     05/21/2019  10:00 AM  PATIENT:  [name]  74 y.o. female  PRE-OPERATIVE DIAGNOSIS:  RIGHT LOWER LUNG MASS  POST-OPERATIVE DIAGNOSIS:  RIGHT LOWER LUNG MASS  PROCEDURE:  Procedure(s): VIDEO BRONCHOSCOPY WITH ENDOBRONCHIAL ULTRASOUND (N/A) MEDIASTINOSCOPY (N/A)  SURGEON:  Surgeon(s) and Role:    * Grace Isaac, MD - Primary    ANESTHESIA:   general  EBL:  10 mL   BLOOD ADMINISTERED:none  DRAINS: none   LOCAL MEDICATIONS USED:  NONE  SPECIMEN:  Source of Specimen:  # 7 and 10 R nodes   DISPOSITION OF SPECIMEN:  PATHOLOGY  COUNTS:  YES   DICTATION: .Dragon Dictation  PLAN OF CARE: Discharge to home after PACU  PATIENT DISPOSITION:  PACU - hemodynamically stable.   Delay start of Pharmacological VTE agent (>24hrs) due to surgical blood loss or risk of bleeding: yes

## 2019-05-21 NOTE — Discharge Instructions (Signed)
Flexible Bronchoscopy, Care After This sheet gives you information about how to care for yourself after your procedure. Your health care provider may also give you more specific instructions. If you have problems or questions, contact your health care provider. What can I expect after the procedure? After the procedure, it is common to have the following symptoms for 24-48 hours:  A cough that is worse than it was before the procedure.  A low-grade fever.  A sore throat or hoarse voice.  Small streaks of blood in the mucus from your lungs (sputum), if tissue samples were removed (biopsy). Follow these instructions at home: Eating and drinking  Do not eat or drink anything (including water) for 2 hours after your procedure, or until your numbing medicine (local anesthetic) has worn off. Having a numb throat increases your risk of burning yourself or choking.  After your numbness is gone and your cough and gag reflexes have returned, you may start eating only soft foods and slowly drinking liquids.  The day after the procedure, return to your normal diet. Driving  Do not drive for 24 hours if you were given a medicine to help you relax (sedative).  Do not drive or use heavy machinery while taking prescription pain medicine. General instructions   Take over-the-counter and prescription medicines only as told by your health care provider.  Return to your normal activities as told by your health care provider. Ask your health care provider what activities are safe for you.  Do not use any products that contain nicotine or tobacco, such as cigarettes and e-cigarettes. If you need help quitting, ask your health care provider.  Keep all follow-up visits as told by your health care provider. This is important, especially if you had a biopsy taken. Get help right away if:  You have shortness of breath that gets worse.  You become light-headed or feel like you might faint.  You have  chest pain.  You cough up more than a small amount of blood.  The amount of blood you cough up increases. Summary  Common symptoms in the 24-48 hours following a flexible bronchoscopy include cough, low-grade fever, sore throat or hoarse voice, and blood-streaked mucus from the lungs (if you had a biopsy).  Do not eat or drink anything (including water) for 2 hours after your procedure, or until your local anesthetic has worn off. You can return to your normal diet the day after the procedure.  Get help right away if you develop worsening shortness of breath, have chest pain, become light-headed, or cough up more than a small amount of blood. This information is not intended to replace advice given to you by your health care provider. Make sure you discuss any questions you have with your health care provider. Document Revised: 01/10/2017 Document Reviewed: 02/16/2016 Elsevier Patient Education  2020 [employer].     Tissue Adhesive Wound Care Some cuts, wounds, lacerations, and incisions can be repaired by using tissue adhesive, also called skin glue. It holds the skin together so healing can happen faster. It forms a strong bond on the skin in about 1 minute, and it reaches its full strength in about 2-3 minutes. The adhesive disappears naturally while the wound is healing. It is important to take proper care of your wound at home while it heals. Follow these instructions at home: Wound care   Showers are allowed after the first 24 hours. Do not soak the area where the tissue adhesive was placed. Do  not take baths, swim, or use hot tubs. Do not use any soaps, petroleum jelly products, or ointments on the wound. Certain ointments can weaken the glue.  If a bandage (dressing) has been applied, keep it dry.  Follow instructions from your health care provider about how often to change the dressing. ? Wash your hands with soap and water before you change your dressing. If soap and water  are not available, use hand sanitizer. ? Change your dressing as told by your health care provider. ? Leave tissue adhesive in place. It will fall off on its own after 7-10 days.  Do not scratch, rub, or pick at the adhesive.  Do not place tape over the adhesive. The adhesive could come off of the wound when you pull the tape off.  Protect the wound from further injury until it is healed.  Protect the wound from sun and tanning bed exposure while it is healing and for several weeks after healing. General instructions  Take over-the-counter and prescription medicines only as told by your health care provider.  Keep all follow-up visits as told by your health care provider. This is important. Get help right away if:  Your wound reopens and is draining.  Your wound becomes red, swollen, hot, or tender.  You develop a rash after the glue is applied.  You have increasing pain in the wound.  You have a red streak going away from the wound.  You have pus coming from the wound.  You have increased bleeding.  You have a fever.  You have shaking chills.  You notice a bad smell coming from the wound.  Your wound or the adhesive breaks open. This information is not intended to replace advice given to you by your health care provider. Make sure you discuss any questions you have with your health care provider. Document Revised: 01/10/2017 Document Reviewed: 12/22/2015 Elsevier Patient Education  2020 [employer].

## 2019-05-21 NOTE — H&P (Signed)
[address]       [place] [address]             [id number]                    [name] Medical Record #[id number] Date of Birth: Jan 14, 1946  Referring: Fay Records, MD Primary Care: Lawerance Cruel, MD Primary Cardiologist: No primary care provider on file.  Chief Complaint:    Chief Complaint  Patient presents with  . Lung Mass    RLLobe...CT CHEST 3/19/PET 3/29/BRONCH/EBUS 05/11/19.Marland KitchenMarland KitchenMRI BRAIN TOMORROW  . Adenopathy    Hilar/Paratracheal     History of Present Illness:    [name] 74 y.o. female is seen in the office  today for right lower lobe lung mass highly suggestive of malignancy.  The patient gives a history of nonproductive cough which has been going on since October 2020.  She had previously had vocal cord polyps and was evaluated for this and for reflux.  Because the cough continued chest x-ray was done that showed a right lower lobe lung mass in the 6 cm in size.  She was referred to pulmonary service and underwent CT of the chest PET scan bronchoscopy EBUS and biopsy by Dr. Ander Slade.  CT scan and PET scan suggested right hilar and mediastinal involvement, cytology of the results of enb showed only malignant cells consistent with squamous cell carcinoma and finally needle aspiration of the right lower lobe.  4R  7 , 10R ( assume path report has typo) needle aspirations showed no malignant cells.  Patient has had no pulmonary function studies but by CT has significant underlying emphysematous lung disease.  She is a former smoker for 40 years quitting approximately 10 years ago.  She notes that she does have a exercise tolerance to work around her house in the yard.  She denies any previous cardiac history denies hypertension hyperlipidemia diabetes history of stroke claudication or renal insufficiency.   Previous surgery includes left shoulder arthropathy arthroscopy bilateral cataracts with lens lens implants and vocal  cord polyp resection-benign   She has no work related known exposures-she worked as a Oncologist  PFT's done - FEV1 = .8 MRI done - no brain  mets   Operation: Flexible video fiberoptic bronchoscopy with endobronchial ultrasound and biopsies. By Dr Ander Slade Clinical History: None provided  FINAL MICROSCOPIC DIAGNOSIS:  B. LYMPH NODE, 4R, FINE NEEDLE ASPIRATION:  - No malignant cells identified.   C. LYMPH NODE, 7, FINE NEEDLE ASPIRATION:  - No malignant cells identified.   D. LYMPH NODE, 1R, FINE NEEDLE ASPIRATION:  - No malignant cells identified.   E. LUNG, RLL, FINE NEEDLE ASPIRATION:  - Malignant cells consistent with squamous cell carcinoma.  - See comment.   COMMENT:   Dr. Vic Ripper has reviewed part E and concurs with this interpretation.  There may be sufficient tumor present for additional studies. Dr. Tamala Julian  was paged on May 27, 2019.    SPECIMEN ADEQUACY:  B. Satisfactory for Evaluation  C. Satisfactory for Evaluation  D. Satisfactory for Evaluation  E. Satisfactory for Evaluation   Clinical History: mediastinal adenopathy (cm)   FINAL MICROSCOPIC DIAGNOSIS:   A. LUNG, RIGHT LOWER LOBE, TRANSBRONCHIAL BIOPSY:  - Lung parenchyma with inflammation and mild cytologic atypia.  - See comment.   COMMENT:   The biopsy is not definitively diagnostic of malignancy in my opinion.  Dr. Vicente Males has reviewed the case  and concurs with this  interpretation. Please also see the patient's concurrent cytology  specimen, [id number].    Current Activity/ Functional Status:  Patient is independent with mobility/ambulation, transfers, ADL's, IADL's.   Zubrod Score: At the time of surgery this patient's most appropriate activity status/level should be described as: []     0    Normal activity, no symptoms [x]     1    Restricted in physical strenuous activity but ambulatory, able to do out light work []     2    Ambulatory and capable of self care, unable to do work  activities, up and about               >50 % of waking hours                              []     3    Only limited self care, in bed greater than 50% of waking hours []     4    Completely disabled, no self care, confined to bed or chair []     5    Moribund   Past Medical History:  Diagnosis Date  . Dyspnea   . Glaucoma   . Headache   . Hypothyroidism   . Right lower lobe lung mass   . Thyroid condition     Past Surgical History:  Procedure Laterality Date  . BIOPSY  05/11/2019   Procedure: BIOPSY;  Surgeon: Laurin Coder, MD;  Location: [facility];  Service: Pulmonary;;  . BRONCHIAL WASHINGS  05/11/2019   Procedure: BRONCHIAL WASHINGS;  Surgeon: Laurin Coder, MD;  Location: [facility];  Service: Pulmonary;;  . CATARACT EXTRACTION, BILATERAL     1 was a lens implant  . COLONOSCOPY    . FINE NEEDLE ASPIRATION  05/11/2019   Procedure: FINE NEEDLE ASPIRATION (FNA) LINEAR;  Surgeon: Laurin Coder, MD;  Location: [facility];  Service: Pulmonary;;  . SHOULDER SURGERY    . VIDEO BRONCHOSCOPY WITH ENDOBRONCHIAL ULTRASOUND N/A 05/11/2019   Procedure: VIDEO BRONCHOSCOPY WITH ENDOBRONCHIAL ULTRASOUND;  Surgeon: Laurin Coder, MD;  Location: [facility];  Service: Pulmonary;  Laterality: N/A;  . vocal cord polyp removal    . WISDOM TOOTH EXTRACTION      History reviewed. No pertinent family history.   Social History   Tobacco Use  Smoking Status Former Smoker  . Types: Cigarettes  . Quit date: 02/13/2010  . Years since quitting: 9.2  Smokeless Tobacco Never Used  Tobacco Comment   Quit in 2011    Social History   Substance and Sexual Activity  Alcohol Use Yes   Comment: very rare, 1 glass of wine maybe every 3-4 weeks     Allergies  Allergen Reactions  . Timolol Nausea Only  . Bee Venom Swelling  . Brimonidine Nausea Only  . Hydrocodone Itching and Rash    Current Facility-Administered Medications  Medication Dose Route Frequency Provider Last  Rate Last Admin  . ceFAZolin (ANCEF) IVPB 2g/100 mL premix  2 g Intravenous 30 min Pre-Op Grace Isaac, MD        Pertinent items are noted in HPI.   Review of Systems:     Cardiac Review of Systems: [Y] = yes  or   [ N ] = no   Chest Pain [ n   ]  Resting SOB [ n  ] Exertional SOB  Blue.Reese  ]  Cardell Peach  ]   Pedal Edema [n   ]    Palpitations Florencio.Farrier  ] Syncope  Florencio.Farrier  ]   Presyncope [ n  ]   General Review of Systems: [Y] = yes [  ]=no Constitional: recent weight change [  ];  Wt loss over the last 3 months [   ] anorexia [  ]; fatigue [  ]; nausea [  ]; night sweats [  ]; fever [  ]; or chills [  ];           Eye : blurred vision [  ]; diplopia [   ]; vision changes [  ];  Amaurosis fugax[  ]; Resp: cough Blue.Reese  ];  wheezing[  ];  hemoptysis[  ]; shortness of breath[  ]; paroxysmal nocturnal dyspnea[  ]; dyspnea on exertion[  ]; or orthopnea[  ];  GI:  gallstones[  ], vomiting[  ];  dysphagia[  ]; melena[  ];  hematochezia [  ]; heartburn[  ];   Hx of  Colonoscopy[  ]; GU: kidney stones [  ]; hematuria[  ];   dysuria [  ];  nocturia[  ];  history of     obstruction [  ]; urinary frequency [  ]             Skin: rash, swelling[  ];, hair loss[  ];  peripheral edema[  ];  or itching[  ]; Musculosketetal: myalgias[  ];  joint swelling[  ];  joint erythema[  ];  joint pain[  ];  back pain[  ];  Heme/Lymph: bruising[  ];  bleeding[  ];  anemia[  ];  Neuro: TIA[  ];  headaches[  ];  stroke[  ];  vertigo[  ];  seizures[  ];   paresthesias[  ];  difficulty walking[  ];  Psych:depression[  ]; anxiety[  ];  Endocrine: diabetes[  ];  thyroid dysfunction[ y ];  Immunizations: Flu up to date [ y ]; Pneumococcal up to date [?  ];  Other:     PHYSICAL EXAMINATION: BP (!) 173/83   Pulse 61   Temp 97.9 F (36.6 C) (Oral)   Resp 18   Ht 5\' 5"  (1.651 m)   Wt 131.2 kg   SpO2 100%   BMI 48.13 kg/m  General appearance: alert, cooperative and no distress Head: Normocephalic, without obvious  abnormality, atraumatic Neck: no adenopathy, no carotid bruit, no JVD, supple, symmetrical, trachea midline and thyroid not enlarged, symmetric, no tenderness/mass/nodules Lymph nodes: Cervical, supraclavicular, and axillary nodes normal. Resp: clear to auscultation bilaterally Cardio: regular rate and rhythm, S1, S2 normal, no murmur, click, rub or gallop GI: soft, non-tender; bowel sounds normal; no masses,  no organomegaly Extremities: extremities normal, atraumatic, no cyanosis or edema Neurologic: Grossly normal  Diagnostic Studies & Laboratory data:     Recent Radiology Findings:  DG Chest 2 View  Result Date: 05/19/2019 CLINICAL DATA:  Lung mass. EXAM: CHEST - 2 VIEW COMPARISON:  Chest x-ray dated May 11, 2019. FINDINGS: The heart size and mediastinal contours are within normal limits. Normal pulmonary vascularity. The lungs remain hyperinflated with emphysematous changes. Grossly unchanged large mass in the posterior right lower lobe. No focal consolidation, pleural effusion, or pneumothorax. No acute osseous abnormality. IMPRESSION: 1. Grossly unchanged large right lower lobe mass. 2. COPD. Electronically Signed   By: Titus Dubin M.D.   On: 05/19/2019 15:55   MR Brain W Wo Contrast  Result Date: 05/20/2019  CLINICAL DATA:  Right lower lung mass EXAM: MRI HEAD WITHOUT AND WITH CONTRAST TECHNIQUE: Multiplanar, multiecho pulse sequences of the brain and surrounding structures were obtained without and with intravenous contrast. CONTRAST:  28mL GADAVIST GADOBUTROL 1 MMOL/ML IV SOLN COMPARISON:  None. FINDINGS: Motion artifact is present. Brain: There is no acute infarction or intracranial hemorrhage. There is no intracranial mass, mass effect, or edema. There is no hydrocephalus or extra-axial fluid collection. Punctate focus of susceptibility in the right parietal white matter is compatible with chronic microhemorrhage or mineralization. Ventricles and sulci are within normal limits in size  and configuration there is no abnormal enhancement within the above limitation. Vascular: Major vessel flow voids at the skull base are preserved. Skull and upper cervical spine: Normal marrow signal is preserved. Sinuses/Orbits: Minor patchy mucosal thickening. Orbits are unremarkable. Other: Sella is unremarkable.  Mastoid air cells are clear. IMPRESSION: No evidence of intracranial metastatic disease. Suboptimal evaluation for subtle lesion due to motion artifact. Electronically Signed   By: Macy Mis M.D.   On: 05/20/2019 10:06      DG Chest 2 View  Result Date: 04/29/2019 CLINICAL DATA:  Nonproductive cough for 6 months. Shortness of breath. Former smoker. EXAM: CHEST - 2 VIEW COMPARISON:  03/05/2013 FINDINGS: The heart size and mediastinal contours are within normal limits. An irregular masslike opacity is seen in the posterior right lower lobe measuring approximately 8 cm, suspicious for bronchogenic carcinoma. Left lung is clear. No evidence of pleural effusion. IMPRESSION: Irregular masslike opacity in posterior right lower lobe, highly suspicious for bronchogenic carcinoma. Chest CT with contrast is recommended further evaluation. These results will be called to the ordering clinician or representative by the Radiologist Assistant, and communication documented in the PACS or [employer]. Electronically Signed   By: Marlaine Hind M.D.   On: 04/29/2019 10:22   CT CHEST W CONTRAST  Result Date: 04/30/2019 CLINICAL DATA:  Cough. Lung mass right lower lobe lesion seen on chest x-ray. EXAM: CT CHEST WITH CONTRAST TECHNIQUE: Multidetector CT imaging of the chest was performed during intravenous contrast administration. CONTRAST:  35mL ISOVUE-300 IOPAMIDOL (ISOVUE-300) INJECTION 61% COMPARISON:  Chest radiograph 04/29/2019. Chest CTA 03/05/2013 FINDINGS: Cardiovascular: Calcified aortic atherosclerosis. Mild aortic tortuosity without aneurysm. No aortic dissection. Heart is normal in size. No  pericardial effusion. There are coronary artery calcifications. Evaluation on tailored for pulmonary embolus evaluation, however there are no filling defects in the pulmonary arteries to the distal lobar/proximal segmental level. Mediastinum/Nodes: Multiple prominent mildly enlarged right hilar lymph nodes including a 13 mm node series 2, image 70 and 11 mm node series 2, image 83. Additional small infrahilar nodes extend along the bronchovascular bundle. There is an 11 mm lower paratracheal node. Small upper mediastinal nodes measuring up to 7 mm. No left hilar adenopathy. No visualized thyroid nodule. No esophageal wall thickening. Lungs/Pleura: Heterogeneous right lower lobe pulmonary mass measures 6.9 x 4.6 x 5.1 cm with central hypodensity suspicious for necrosis. Margins are irregular and spiculated with surrounding ground-glass opacity. Posterior extent extends to the pleura. Irregular spiculations extend to the minor fissure. Moderate emphysema. No additional pulmonary nodule or mass. Minimal scarring in the anterior lingula. Trace right pleural thickening without frank effusion. Upper Abdomen: Slight left adrenal thickening without dominant nodule. Normal right adrenal gland. No evidence of focal hepatic lesion. The included spleen, pancreas, kidneys, stomach and colon are unremarkable. Atherosclerosis of upper abdominal aorta. Musculoskeletal: No destructive lytic or focal blastic osseous lesions. No chest wall lesions. IMPRESSION: 1.  Heterogeneous right lower lobe pulmonary mass measuring 6.9 x 4.6 x 5.1 cm, highly concerning for primary bronchogenic malignancy. Margins are irregular with central hypodensity/necrosis. 2. Right hilar and lower paratracheal adenopathy, suspicious for nodal metastatic disease. 3. Moderate emphysema. Aortic Atherosclerosis (ICD10-I70.0) and Emphysema (ICD10-J43.9). These results will be called to the ordering clinician or representative by the Radiologist Assistant, and  communication documented in the PACS or [employer]. Electronically Signed   By: Keith Rake M.D.   On: 04/30/2019 15:38   NM PET Image Initial (PI) Skull Base To Thigh  Result Date: 05/10/2019 CLINICAL DATA:  Initial treatment strategy for lung cancer. EXAM: NUCLEAR MEDICINE PET SKULL BASE TO THIGH TECHNIQUE: 6.3 mCi F-18 FDG was injected intravenously. Full-ring PET imaging was performed from the skull base to thigh after the radiotracer. CT data was obtained and used for attenuation correction and anatomic localization. Fasting blood glucose: 98 mg/dl COMPARISON:  CT chest 04/30/2019 FINDINGS: Mediastinal blood pool activity: SUV max 2.0 Liver activity: SUV max NA NECK: Diffuse bilateral thyroid activity suggesting low-grade thyroiditis. There is no appreciable nodule to measure in order to provide a nodule size. No imaging follow up recommended. (Ref: J Am Coll Radiol. 2015 Feb;12(2): 143-50). Incidental CT findings: Bilateral common carotid atherosclerotic calcification. CHEST: The right lower lobe mass measures 6.6 by 4.5 cm on image 43/8 demonstrates central necrosis and a maximum SUV of 18.7. Subcarinal node 0.9 cm in short axis on image 72/4, maximum SUV 5.6, compatible with malignant involvement. Indistinct right hilar and infrahilar adenopathy with maximum SUV 5.0, compatible with malignant involvement. A precarinal lower paratracheal node measuring 0.8 cm in short axis on image 68/4 has a maximum SUV of 3.9 which is above blood pool and could be an indicator of malignant involvement although is less certain in the other nodal stations. Incidental CT findings: Centrilobular emphysema. Atherosclerotic calcification of the aortic arch and branch vessels. Descending thoracic aortic atherosclerosis. ABDOMEN/PELVIS: Subtle nodularity of the left adrenal gland without discrete mass, maximum SUV 2.8, no compelling findings of adrenal malignancy. Activity along the perineum without CT abnormality,  compatible with urinary FDG and mild incontinence. Incidental CT findings: Aortoiliac atherosclerotic vascular disease. Mildly accentuated density centrally in the uterus, nonspecific but possibly from a uterine fibroid, an without accentuated metabolic activity. SKELETON: No significant abnormal hypermetabolic activity in this region. Incidental CT findings: none IMPRESSION: 1. The 6.6 cm right lower lobe mass has a maximum SUV of 18.7, compatible with malignancy. Small hypermetabolic subcarinal and right hilar/infrahilar nodes compatible with malignant involvement. Precarinal/lower paratracheal node measuring 0.8 cm in short axis has low-grade but above blood pool activity and could well represent early involvement. No findings of metastatic disease outside of the chest. 2. Aortic Atherosclerosis (ICD10-I70.0) and Emphysema (ICD10-J43.9). Electronically Signed   By: Van Clines M.D.   On: 05/10/2019 15:46       I have independently reviewed the above radiology studies  and reviewed the findings with the patient.   Recent Lab Findings: Lab Results  Component Value Date   WBC 5.5 05/19/2019   HGB 11.4 (L) 05/19/2019   HCT 36.7 05/19/2019   PLT 388 05/19/2019   GLUCOSE 94 05/19/2019   ALT 32 05/19/2019   AST 15 05/19/2019   NA 130 (L) 05/19/2019   K 3.8 05/19/2019   CL 94 (L) 05/19/2019   CREATININE 0.52 05/19/2019   BUN 8 05/19/2019   CO2 26 05/19/2019   TSH 6.051 (H) 03/06/2010   INR 1.1 05/19/2019  Assessment / Plan:   #1 patient with large squamous cell carcinoma of the lung - right lower lobe with suspicious lymph nodes from mediastinal nodal involvement but without tissue confirmation following bronchoscopy and EBUS.-With current clinical findings this could be stage IIIb versus  IIB #2 underlying emphysematous lung changes-  pulmonary function studies- Pulmonary Function Diagnosis: Severe Obstructive Airways Disease -Emphysematous Type- severity will limit resection  possibility with FEV1= .8   I have discussed with the patient and her daughterradiographic findings and have recommended that we proceed with repeat bronchoscopy with EBUS possible mediastinoscopy.  The goals risks and alternatives of the planned surgical procedure Procedure(s): VIDEO BRONCHOSCOPY WITH ENDOBRONCHIAL ULTRASOUND (N/A) BIOPSY (N/A) possible MEDIASTINOSCOPY (N/A)  have been discussed with the patient in detail. The risks of the procedure including death, infection, stroke, myocardial infarction, bleeding, blood transfusion have all been discussed specifically.  I have quoted [name] a 1 % of perioperative mortality and a complication rate as high as 10 %. The patient's questions have been answered.[name] is willing  to proceed with the planned procedure.      Grace Isaac MD      [address] [place] [address] Office [phone]     05/21/2019 7:20 AM

## 2019-05-24 ENCOUNTER — Telehealth: Payer: Self-pay | Admitting: Cardiothoracic Surgery

## 2019-05-24 LAB — SURGICAL PATHOLOGY

## 2019-05-24 LAB — CYTOLOGY - NON PAP

## 2019-05-24 NOTE — Telephone Encounter (Signed)
Call patient and reviewed recent path results from last week, #7 and 10 or lymph nodes were negative for tumor. FEV1 is 0.8 discussed with patient the associated risk of lobectomy with her limited lung function She has appointment in medical oncology tomorrow morning. Grace Isaac MD      [address] [place] [address] Office [phone]

## 2019-05-24 NOTE — Telephone Encounter (Signed)
Call patient daughter as the patient had requested and reviewed recent path results from last week, #7 and 10 or lymph nodes were negative for tumor.  FEV1 is 0.8 discussed with Abigail Butts  the associated risk of lobectomy with her limited lung function She has appointment in medical oncology tomorrow morning.  Grace Isaac MD

## 2019-05-25 ENCOUNTER — Inpatient Hospital Stay: Payer: Medicare Other | Attending: Hematology

## 2019-05-25 ENCOUNTER — Encounter: Payer: Self-pay | Admitting: *Deleted

## 2019-05-25 ENCOUNTER — Other Ambulatory Visit: Payer: Self-pay | Admitting: Radiology

## 2019-05-25 ENCOUNTER — Other Ambulatory Visit: Payer: Medicare Other

## 2019-05-25 ENCOUNTER — Other Ambulatory Visit: Payer: Self-pay

## 2019-05-25 ENCOUNTER — Inpatient Hospital Stay (HOSPITAL_BASED_OUTPATIENT_CLINIC_OR_DEPARTMENT_OTHER): Payer: Medicare Other | Admitting: Hematology

## 2019-05-25 ENCOUNTER — Inpatient Hospital Stay: Payer: Medicare Other

## 2019-05-25 ENCOUNTER — Encounter: Payer: Self-pay | Admitting: Hematology

## 2019-05-25 ENCOUNTER — Ambulatory Visit: Payer: Medicare Other | Admitting: Nutrition

## 2019-05-25 VITALS — BP 153/61 | HR 75 | Temp 97.1°F | Resp 20 | Ht 59.5 in | Wt 117.0 lb

## 2019-05-25 DIAGNOSIS — Z5111 Encounter for antineoplastic chemotherapy: Secondary | ICD-10-CM | POA: Insufficient documentation

## 2019-05-25 DIAGNOSIS — C3431 Malignant neoplasm of lower lobe, right bronchus or lung: Secondary | ICD-10-CM | POA: Insufficient documentation

## 2019-05-25 DIAGNOSIS — E871 Hypo-osmolality and hyponatremia: Secondary | ICD-10-CM

## 2019-05-25 DIAGNOSIS — D649 Anemia, unspecified: Secondary | ICD-10-CM | POA: Diagnosis not present

## 2019-05-25 DIAGNOSIS — R918 Other nonspecific abnormal finding of lung field: Secondary | ICD-10-CM

## 2019-05-25 DIAGNOSIS — T451X5A Adverse effect of antineoplastic and immunosuppressive drugs, initial encounter: Secondary | ICD-10-CM | POA: Insufficient documentation

## 2019-05-25 DIAGNOSIS — D6481 Anemia due to antineoplastic chemotherapy: Secondary | ICD-10-CM | POA: Insufficient documentation

## 2019-05-25 DIAGNOSIS — C3491 Malignant neoplasm of unspecified part of right bronchus or lung: Secondary | ICD-10-CM

## 2019-05-25 LAB — CBC WITH DIFFERENTIAL (CANCER CENTER ONLY)
Abs Immature Granulocytes: 0.16 10*3/uL — ABNORMAL HIGH (ref 0.00–0.07)
Basophils Absolute: 0 10*3/uL (ref 0.0–0.1)
Basophils Relative: 1 %
Eosinophils Absolute: 0.2 10*3/uL (ref 0.0–0.5)
Eosinophils Relative: 2 %
HCT: 34.4 % — ABNORMAL LOW (ref 36.0–46.0)
Hemoglobin: 11.1 g/dL — ABNORMAL LOW (ref 12.0–15.0)
Immature Granulocytes: 2 %
Lymphocytes Relative: 14 %
Lymphs Abs: 1.2 10*3/uL (ref 0.7–4.0)
MCH: 28 pg (ref 26.0–34.0)
MCHC: 32.3 g/dL (ref 30.0–36.0)
MCV: 86.6 fL (ref 80.0–100.0)
Monocytes Absolute: 1 10*3/uL (ref 0.1–1.0)
Monocytes Relative: 12 %
Neutro Abs: 6 10*3/uL (ref 1.7–7.7)
Neutrophils Relative %: 69 %
Platelet Count: 368 10*3/uL (ref 150–400)
RBC: 3.97 MIL/uL (ref 3.87–5.11)
RDW: 13.8 % (ref 11.5–15.5)
WBC Count: 8.6 10*3/uL (ref 4.0–10.5)
nRBC: 0 % (ref 0.0–0.2)

## 2019-05-25 LAB — CMP (CANCER CENTER ONLY)
ALT: 26 U/L (ref 0–44)
AST: 9 U/L — ABNORMAL LOW (ref 15–41)
Albumin: 4 g/dL (ref 3.5–5.0)
Alkaline Phosphatase: 113 U/L (ref 38–126)
Anion gap: 6 (ref 5–15)
BUN: 13 mg/dL (ref 8–23)
CO2: 29 mmol/L (ref 22–32)
Calcium: 9.8 mg/dL (ref 8.9–10.3)
Chloride: 95 mmol/L — ABNORMAL LOW (ref 98–111)
Creatinine: 0.52 mg/dL (ref 0.44–1.00)
GFR, Est AFR Am: >60 mL/min (ref >60)
GFR, Estimated: >60 mL/min (ref >60)
Glucose, Bld: 121 mg/dL — ABNORMAL HIGH (ref 70–99)
Potassium: 4.9 mmol/L (ref 3.5–5.1)
Sodium: 130 mmol/L — ABNORMAL LOW (ref 135–145)
Total Bilirubin: 0.4 mg/dL (ref 0.3–1.2)
Total Protein: 6.9 g/dL (ref 6.5–8.1)

## 2019-05-25 MED ORDER — PROCHLORPERAZINE MALEATE 10 MG PO TABS: 10.0000 mg | ORAL_TABLET | Freq: Four times a day (QID) | ORAL | 1 refills | Status: DC | PRN

## 2019-05-25 MED ORDER — LORAZEPAM 0.5 MG PO TABS: 0.5000 mg | ORAL_TABLET | Freq: Four times a day (QID) | ORAL | 0 refills | Status: DC | PRN

## 2019-05-25 MED ORDER — LIDOCAINE-PRILOCAINE 2.5-2.5 % EX CREA: TOPICAL_CREAM | CUTANEOUS | 3 refills | Status: DC

## 2019-05-25 MED ORDER — DEXAMETHASONE 4 MG PO TABS: 8.0000 mg | ORAL_TABLET | Freq: Every day | ORAL | 1 refills | Status: DC

## 2019-05-25 MED ORDER — ONDANSETRON HCL 8 MG PO TABS: 8.0000 mg | ORAL_TABLET | Freq: Two times a day (BID) | ORAL | 1 refills | Status: DC | PRN

## 2019-05-25 NOTE — Progress Notes (Signed)
Patient in chemotherapy education class with  Husband Florene Glen.  Discussed side effects of  Carboplatin and Taxol                 which include but are not limited to myelosuppression, decreased appetite, fatigue, fever, allergic or infusional reaction, mucositis, cardiac toxicity, cough, SOB, altered taste, nausea and vomiting, diarrhea, constipation, elevated LFTs myalgia and arthralgias, hair loss or thinning, rash, skin dryness, nail changes, peripheral neuropathy, discolored urine, delayed wound healing, mental changes (Chemo brain), increased risk of infections, weight loss.  Reviewed infusion room and office policy and procedure and phone numbers 24 hours x 7 days a week.  Reviewed when to call the office with any concerns or problems.  Scientist, clinical (histocompatibility and immunogenetics) given.  Discussed portacath insertion and EMLA cream administration.  Antiemetic protocol and chemotherapy schedule reviewed. Patient verbalized understanding of chemotherapy indications and possible side effects.  Teachback done

## 2019-05-25 NOTE — Progress Notes (Signed)
Patient in chemotherapy education class with  Husband.  Discussed side effects of  Rituxan and Bendeka                           which include but are not limited to myelosuppression, decreased appetite, fatigue, fever, allergic or infusional reaction, mucositis, cardiac toxicity, cough, SOB, altered taste, nausea and vomiting, diarrhea, constipation, elevated LFTs myalgia and arthralgias, hair loss or thinning, rash, skin dryness, nail changes, peripheral neuropathy, discolored urine, delayed wound healing, mental changes (Chemo brain), increased risk of infections, weight loss.  Reviewed infusion room and office policy and procedure and phone numbers 24 hours x 7 days a week.  Reviewed when to call the office with any concerns or problems.  Scientist, clinical (histocompatibility and immunogenetics) given. Marland Kitchen  Antiemetic protocol and chemotherapy schedule reviewed. Patient verbalized understanding of chemotherapy indications and possible side effects.  Teachback done

## 2019-05-25 NOTE — Progress Notes (Signed)
[facility] OFFICE PROGRESS NOTE  Patient Care Team: Lawerance Cruel, MD as PCP - General (Family Medicine) Tish Men, MD as Medical Oncologist (Oncology) Cordelia Poche, RN as Oncology Nurse Navigator  HEME/ONC OVERVIEW: 1. Stage IIIA (cT4N0M0) squamous cell carcinoma of the right lower lung  -04/2019:   Large RLL lung mass ~7x5cm with ipsilateral hilar and paratracheal adenopathy; moderate emphysema   FDG-avid RLL mass 6.6cm with small hypermetabolic activity in the ipsilateral hilar and paratracheal LN's  EBUS w/ LN bx (separately by pulmonary medicine and thoracic surgery): squamous cell carcinoma (R lung), LN's all negative  Not a candidate for surgery due to poor PFT's  -05/2019 - present: definitive chemoradiation with weekly carboplatin/paclitaxel, w/ plan for maintenance durvalumab   ASSESSMENT & PLAN:   Stage IIIA (cT4N0M0) squamous cell carcinoma of the right lower lung  -I independently reviewed the radiologic images with recent MRI brain, CT chest, and PET scan, nuclear findings documented -In summary, CT and PET demonstrated a large right lower lobe lung mass measuring at least 7 cm with suspicious thoracic adenopathy.  MRI brain was negative for metastatic disease. -Patient underwent EBUS x 2, which showed squamous cell carcinoma in the right primary lung mass, but thoracic lymph nodes were negative for metastatic disease.  Unfortunately, the patient's PFTs were poor, and the disease was deemed unresectable.  -I reviewed the NCCN guideline in detail with the patient -Given the unresectable primary right lung malignancy, the standard-of-care approach is definitive chemoradiation, followed by maintenance immunotherapy with durvalumab -We discussed some of the risks, benefits and side-effects of carboplatin and paclitaxel.  The treatment is weekly x 7 cycles, concurrent with radiation. -After completing chemoradiation, we will repeat CT to assess disease  response.  If stable or improving disease, then she will proceed with durvalumab.   -Some of the short term side-effects included, though not limited to, including fatigue, weight loss, dyspnea, cough, musculoskeletal pain, life threatening infections, risk of allergic reactions, need for transfusions of blood products, nausea, vomiting, change in bowel habits, loss of hair, admission to hospital for various reasons, and risks of death. Clinically significant immune-related adverse events for patients receiving immunotherapy included pneumonitis, hepatitis, colitis, and thyroid disease. -Long term side-effects are also discussed including risks of permanent damage to nerve function, hearing loss, chronic fatigue, kidney damage with possibility needing hemodialysis, and rare secondary malignancy including bone marrow disorders. -The patient is aware that the response rates discussed earlier is not guaranteed.   -After extensive discussion, patient expressed understanding and agreed to proceed with the plan  -Port scheduled on 05/27/2019; simulation on 05/26/2019 -We will tentatively start chemotherapy next week, pending the radiation oncology appt  -Finally, I have prescribed PRN anti-emetics, including Zofran, Compazine, dexamethasone and Ativan   Hyponatremia -Etiology unclear; possibly due to lung cancer  -Na 130 today, stably low; patient denies any symptoms -Given the lack of clinical symptoms, we will continue increasing dietary salt intake  -If Na becomes lower, we can refer the patient to endocrinology for further evaluation   Normocytic anemia -Likely due to anemia of chronic disease  -Hgb 11.1 today, stable; iron profile pending -Patient denies any symptoms of bleeding -Continue treatment as outlined above   Orders Placed This Encounter  Procedures  . CBC with Differential (Cancer Center Only)    Standing Status:   Standing    Number of Occurrences:   20    Standing Expiration Date:    05/24/2020  . CMP ([place]  only)    Standing Status:   Standing    Number of Occurrences:   20    Standing Expiration Date:   05/24/2020    The total time spent in the encounter was 50 minutes, including face-to-face time with the patient, review of various tests results, order additional studies/medications, documentation, and coordination of care plan.   All questions were answered. The patient knows to call the clinic with any problems, questions or concerns. No barriers to learning was detected.  Return in 2 weeks for labs and clinic appt at the start of Cycle 2 of chemotherapy.   Tish Men, MD 4/13/20212:03 PM  CHIEF COMPLAINT: "I am doing okay"  INTERVAL HISTORY: [name] returns clinic for follow-up of locally advanced squamous cell carcinoma of the right lung.  Patient recently underwent EBUS x2, both of which showed negative thoracic lymph node involvement.  The biopsy of the right lung mass showed squamous cell carcinoma.  Patient reports that she has some periodic cough, for which she takes Robitussin DM as needed at night.  She has been trying to increase her dietary salt intake, but she is still only taking in small amount of salt, as she is not used to add salt to her diet.  She denies any other complaint today.  REVIEW OF SYSTEMS:   Constitutional: ( - ) fevers, ( - )  chills , ( - ) night sweats Eyes: ( - ) blurriness of vision, ( - ) double vision, ( - ) watery eyes Ears, nose, mouth, throat, and face: ( - ) mucositis, ( - ) sore throat Respiratory: ( + ) cough, ( - ) dyspnea, ( - ) wheezes Cardiovascular: ( - ) palpitation, ( - ) chest discomfort, ( - ) lower extremity swelling Gastrointestinal:  ( - ) nausea, ( - ) heartburn, ( - ) change in bowel habits Skin: ( - ) abnormal skin rashes Lymphatics: ( - ) new lymphadenopathy, ( - ) easy bruising Neurological: ( - ) numbness, ( - ) tingling, ( - ) new weaknesses Behavioral/Psych: ( - ) mood change, ( - ) new  changes  All other systems were reviewed with the patient and are negative.  SUMMARY OF ONCOLOGIC HISTORY: Oncology History  Squamous cell carcinoma lung ([place])  04/30/2019 Imaging   CT chest: IMPRESSION: 1. Heterogeneous right lower lobe pulmonary mass measuring 6.9 x 4.6 x 5.1 cm, highly concerning for primary bronchogenic malignancy. Margins are irregular with central hypodensity/necrosis. 2. Right hilar and lower paratracheal adenopathy, suspicious for nodal metastatic disease. 3. Moderate emphysema.   Aortic Atherosclerosis (ICD10-I70.0) and Emphysema (ICD10-J43.9).  ADDENDUM: The original report was by Dr. Keith Rake. The following addendum is by Dr. Van Clines:   On image 84/3, I measure the long-axis diameter of the right lower lobe tumor at 7.0 cm.   05/07/2019 Initial Diagnosis   Squamous cell carcinoma lung ([place])   05/10/2019 Imaging   PET: IMPRESSION: 1. The 6.6 cm right lower lobe mass has a maximum SUV of 18.7, compatible with malignancy. Small hypermetabolic subcarinal and right hilar/infrahilar nodes compatible with malignant involvement. Precarinal/lower paratracheal node measuring 0.8 cm in short axis has low-grade but above blood pool activity and could well represent early involvement. No findings of metastatic disease outside of the chest. 2. Aortic Atherosclerosis (ICD10-I70.0) and Emphysema (ICD10-J43.9).    05/11/2019 Pathology Results   FINAL MICROSCOPIC DIAGNOSIS:   A. LUNG, RIGHT LOWER LOBE, TRANSBRONCHIAL BIOPSY:  - Lung parenchyma with inflammation and mild cytologic  atypia.  - See comment.   B. LYMPH NODE, 4R, FINE NEEDLE ASPIRATION: - No malignant cells identified.  C. LYMPH NODE, 7, FINE NEEDLE ASPIRATION: - No malignant cells identified.  D. LYMPH NODE, 11R, FINE NEEDLE ASPIRATION: - No malignant cells identified.  E. LUNG, RLL, FINE NEEDLE ASPIRATION: - Malignant cells consistent with squamous cell carcinoma. - See  comment.   05/19/2019 Imaging   MRI brain: IMPRESSION: No evidence of intracranial metastatic disease. Suboptimal evaluation for subtle lesion due to motion artifact.   05/21/2019 Pathology Results   FINAL MICROSCOPIC DIAGNOSIS:   A. LYMPH NODE, 7, BIOPSY:  - Benign lymph node.   A. LYMPH NODE, 7, FINE NEEDLE ASPIRATION:  - No malignant cells identified  - Lymphoid tissue present.   B. LYMPH NODE, 10 R, FINE NEEDLE ASPIRATION:  - No malignant cells identified  - Lymphoid tissue present.    05/25/2019 Cancer Staging   Staging form: Lung, AJCC 8th Edition - Clinical: Stage IIIA (cT4, cN0, cM0) - Signed by Tish Men, MD on 05/25/2019   06/01/2019 -  Chemotherapy   The patient had palonosetron (ALOXI) injection 0.25 mg, 0.25 mg, Intravenous,  Once, 0 of 7 cycles CARBOplatin (PARAPLATIN) 130 mg in sodium chloride 0.9 % 100 mL chemo infusion, 130 mg (100 % of original dose 132.8 mg), Intravenous,  Once, 0 of 7 cycles Dose modification: 132.8 mg (original dose 132.8 mg, Cycle 1) PACLitaxel (TAXOL) 66 mg in sodium chloride 0.9 % 150 mL chemo infusion (</= 80mg /m2), 45 mg/m2 = 66 mg, Intravenous,  Once, 0 of 7 cycles  for chemotherapy treatment.      I have reviewed the past medical history, past surgical history, social history and family history with the patient and they are unchanged from previous note.  ALLERGIES:  is allergic to bee venom; timolol; brimonidine; and hydrocodone.  MEDICATIONS:  Current Outpatient Medications  Medication Sig Dispense Refill  . Cholecalciferol (VITAMIN D) 50 MCG (2000 UT) tablet Take 2,000 Units by mouth daily with supper.    . dorzolamide (TRUSOPT) 2 % ophthalmic solution Place 1 drop into both eyes 2 (two) times daily.     Marland Kitchen levothyroxine (SYNTHROID) 88 MCG tablet Take 1 tablet (88 mcg total) by mouth See admin instructions. Take 44 mcg on Mon and Fri, Take 88 mcg on Sun, Tues, Wed, Thurs, and Sat 30 tablet 1  . travoprost, benzalkonium, (TRAVATAN)  0.004 % ophthalmic solution Place 1 drop into both eyes at bedtime.      Marland Kitchen dexamethasone (DECADRON) 4 MG tablet Take 2 tablets (8 mg total) by mouth daily. Start the day after chemotherapy for 2 days. 30 tablet 1  . diphenhydrAMINE (BENADRYL) 25 MG tablet Take 50 mg by mouth daily as needed (allergic reaction).    . famotidine (PEPCID) 10 MG tablet Take 10 mg by mouth daily as needed (take with benadryl when stung).    Marland Kitchen lidocaine-prilocaine (EMLA) cream Apply to affected area once 30 g 3  . LORazepam (ATIVAN) 0.5 MG tablet Take 1 tablet (0.5 mg total) by mouth every 6 (six) hours as needed (Nausea or vomiting). 30 tablet 0  . ondansetron (ZOFRAN) 8 MG tablet Take 1 tablet (8 mg total) by mouth 2 (two) times daily as needed for refractory nausea / vomiting. Start on day 3 after chemo. 30 tablet 1  . prochlorperazine (COMPAZINE) 10 MG tablet Take 1 tablet (10 mg total) by mouth every 6 (six) hours as needed (Nausea or vomiting). 30 tablet 1  No current facility-administered medications for this visit.    PHYSICAL EXAMINATION: ECOG PERFORMANCE STATUS: 1 - Symptomatic but completely ambulatory  Today's Vitals   05/25/19 1346  BP: (!) 153/61  Pulse: 75  Resp: 20  Temp: (!) 97.1 F (36.2 C)  TempSrc: Temporal  SpO2: 99%  Weight: 117 lb (53.1 kg)  Height: 4' 11.5" (1.511 m)  PainSc: 0-No pain   Body mass index is 23.24 kg/m.  Filed Weights   05/25/19 1346  Weight: 117 lb (53.1 kg)    GENERAL: alert, no distress and comfortable SKIN: skin color, texture, turgor are normal, no rashes or significant lesions EYES: conjunctiva are pink and non-injected, sclera clear OROPHARYNX: no exudate, no erythema; lips, buccal mucosa, and tongue normal  NECK: supple, non-tender LUNGS: clear to auscultation with normal breathing effort HEART: regular rate & rhythm and no murmurs and no lower extremity edema ABDOMEN: soft, non-tender, non-distended, normal bowel sounds Musculoskeletal: no cyanosis  of digits and no clubbing  PSYCH: alert & oriented x 3, fluent speech  LABORATORY DATA:  I have reviewed the data as listed    Component Value Date/Time   NA 130 (L) 05/25/2019 1130   K 4.9 05/25/2019 1130   CL 95 (L) 05/25/2019 1130   CO2 29 05/25/2019 1130   GLUCOSE 121 (H) 05/25/2019 1130   BUN 13 05/25/2019 1130   CREATININE 0.52 05/25/2019 1130   CALCIUM 9.8 05/25/2019 1130   PROT 6.9 05/25/2019 1130   ALBUMIN 4.0 05/25/2019 1130   AST 9 (L) 05/25/2019 1130   ALT 26 05/25/2019 1130   ALKPHOS 113 05/25/2019 1130   BILITOT 0.4 05/25/2019 1130   GFRNONAA >60 05/25/2019 1130   GFRAA >60 05/25/2019 1130    No results found for: SPEP, UPEP  Lab Results  Component Value Date   WBC 8.6 05/25/2019   NEUTROABS 6.0 05/25/2019   HGB 11.1 (L) 05/25/2019   HCT 34.4 (L) 05/25/2019   MCV 86.6 05/25/2019   PLT 368 05/25/2019      Chemistry      Component Value Date/Time   NA 130 (L) 05/25/2019 1130   K 4.9 05/25/2019 1130   CL 95 (L) 05/25/2019 1130   CO2 29 05/25/2019 1130   BUN 13 05/25/2019 1130   CREATININE 0.52 05/25/2019 1130      Component Value Date/Time   CALCIUM 9.8 05/25/2019 1130   ALKPHOS 113 05/25/2019 1130   AST 9 (L) 05/25/2019 1130   ALT 26 05/25/2019 1130   BILITOT 0.4 05/25/2019 1130       RADIOGRAPHIC STUDIES: I have personally reviewed the radiological images as listed below and agreed with the findings in the report. DG Chest 2 View  Result Date: 05/19/2019 CLINICAL DATA:  Lung mass. EXAM: CHEST - 2 VIEW COMPARISON:  Chest x-ray dated May 11, 2019. FINDINGS: The heart size and mediastinal contours are within normal limits. Normal pulmonary vascularity. The lungs remain hyperinflated with emphysematous changes. Grossly unchanged large mass in the posterior right lower lobe. No focal consolidation, pleural effusion, or pneumothorax. No acute osseous abnormality. IMPRESSION: 1. Grossly unchanged large right lower lobe mass. 2. COPD. Electronically  Signed   By: Titus Dubin M.D.   On: 05/19/2019 15:55   DG Chest 2 View  Result Date: 04/29/2019 CLINICAL DATA:  Nonproductive cough for 6 months. Shortness of breath. Former smoker. EXAM: CHEST - 2 VIEW COMPARISON:  03/05/2013 FINDINGS: The heart size and mediastinal contours are within normal limits. An irregular masslike opacity  is seen in the posterior right lower lobe measuring approximately 8 cm, suspicious for bronchogenic carcinoma. Left lung is clear. No evidence of pleural effusion. IMPRESSION: Irregular masslike opacity in posterior right lower lobe, highly suspicious for bronchogenic carcinoma. Chest CT with contrast is recommended further evaluation. These results will be called to the ordering clinician or representative by the Radiologist Assistant, and communication documented in the PACS or [employer]. Electronically Signed   By: Marlaine Hind M.D.   On: 04/29/2019 10:22   CT CHEST W CONTRAST  Addendum Date: 05/25/2019   ADDENDUM REPORT: 05/25/2019 12:58 ADDENDUM: The original report was by Dr. Keith Rake. The following addendum is by Dr. Van Clines: On image 84/3, I measure the long-axis diameter of the right lower lobe tumor at 7.0 cm. Electronically Signed   By: Van Clines M.D.   On: 05/25/2019 12:58   Result Date: 05/25/2019 CLINICAL DATA:  Cough. Lung mass right lower lobe lesion seen on chest x-ray. EXAM: CT CHEST WITH CONTRAST TECHNIQUE: Multidetector CT imaging of the chest was performed during intravenous contrast administration. CONTRAST:  68mL ISOVUE-300 IOPAMIDOL (ISOVUE-300) INJECTION 61% COMPARISON:  Chest radiograph 04/29/2019. Chest CTA 03/05/2013 FINDINGS: Cardiovascular: Calcified aortic atherosclerosis. Mild aortic tortuosity without aneurysm. No aortic dissection. Heart is normal in size. No pericardial effusion. There are coronary artery calcifications. Evaluation on tailored for pulmonary embolus evaluation, however there are no filling  defects in the pulmonary arteries to the distal lobar/proximal segmental level. Mediastinum/Nodes: Multiple prominent mildly enlarged right hilar lymph nodes including a 13 mm node series 2, image 70 and 11 mm node series 2, image 83. Additional small infrahilar nodes extend along the bronchovascular bundle. There is an 11 mm lower paratracheal node. Small upper mediastinal nodes measuring up to 7 mm. No left hilar adenopathy. No visualized thyroid nodule. No esophageal wall thickening. Lungs/Pleura: Heterogeneous right lower lobe pulmonary mass measures 6.9 x 4.6 x 5.1 cm with central hypodensity suspicious for necrosis. Margins are irregular and spiculated with surrounding ground-glass opacity. Posterior extent extends to the pleura. Irregular spiculations extend to the minor fissure. Moderate emphysema. No additional pulmonary nodule or mass. Minimal scarring in the anterior lingula. Trace right pleural thickening without frank effusion. Upper Abdomen: Slight left adrenal thickening without dominant nodule. Normal right adrenal gland. No evidence of focal hepatic lesion. The included spleen, pancreas, kidneys, stomach and colon are unremarkable. Atherosclerosis of upper abdominal aorta. Musculoskeletal: No destructive lytic or focal blastic osseous lesions. No chest wall lesions. IMPRESSION: 1. Heterogeneous right lower lobe pulmonary mass measuring 6.9 x 4.6 x 5.1 cm, highly concerning for primary bronchogenic malignancy. Margins are irregular with central hypodensity/necrosis. 2. Right hilar and lower paratracheal adenopathy, suspicious for nodal metastatic disease. 3. Moderate emphysema. Aortic Atherosclerosis (ICD10-I70.0) and Emphysema (ICD10-J43.9). These results will be called to the ordering clinician or representative by the Radiologist Assistant, and communication documented in the PACS or [employer]. Electronically Signed: By: Keith Rake M.D. On: 04/30/2019 15:38   MR Brain W Wo  Contrast  Result Date: 05/20/2019 CLINICAL DATA:  Right lower lung mass EXAM: MRI HEAD WITHOUT AND WITH CONTRAST TECHNIQUE: Multiplanar, multiecho pulse sequences of the brain and surrounding structures were obtained without and with intravenous contrast. CONTRAST:  54mL GADAVIST GADOBUTROL 1 MMOL/ML IV SOLN COMPARISON:  None. FINDINGS: Motion artifact is present. Brain: There is no acute infarction or intracranial hemorrhage. There is no intracranial mass, mass effect, or edema. There is no hydrocephalus or extra-axial fluid collection. Punctate focus of susceptibility in  the right parietal white matter is compatible with chronic microhemorrhage or mineralization. Ventricles and sulci are within normal limits in size and configuration there is no abnormal enhancement within the above limitation. Vascular: Major vessel flow voids at the skull base are preserved. Skull and upper cervical spine: Normal marrow signal is preserved. Sinuses/Orbits: Minor patchy mucosal thickening. Orbits are unremarkable. Other: Sella is unremarkable.  Mastoid air cells are clear. IMPRESSION: No evidence of intracranial metastatic disease. Suboptimal evaluation for subtle lesion due to motion artifact. Electronically Signed   By: Macy Mis M.D.   On: 05/20/2019 10:06   NM PET Image Initial (PI) Skull Base To Thigh  Result Date: 05/10/2019 CLINICAL DATA:  Initial treatment strategy for lung cancer. EXAM: NUCLEAR MEDICINE PET SKULL BASE TO THIGH TECHNIQUE: 6.3 mCi F-18 FDG was injected intravenously. Full-ring PET imaging was performed from the skull base to thigh after the radiotracer. CT data was obtained and used for attenuation correction and anatomic localization. Fasting blood glucose: 98 mg/dl COMPARISON:  CT chest 04/30/2019 FINDINGS: Mediastinal blood pool activity: SUV max 2.0 Liver activity: SUV max NA NECK: Diffuse bilateral thyroid activity suggesting low-grade thyroiditis. There is no appreciable nodule to measure in  order to provide a nodule size. No imaging follow up recommended. (Ref: J Am Coll Radiol. 2015 Feb;12(2): 143-50). Incidental CT findings: Bilateral common carotid atherosclerotic calcification. CHEST: The right lower lobe mass measures 6.6 by 4.5 cm on image 43/8 demonstrates central necrosis and a maximum SUV of 18.7. Subcarinal node 0.9 cm in short axis on image 72/4, maximum SUV 5.6, compatible with malignant involvement. Indistinct right hilar and infrahilar adenopathy with maximum SUV 5.0, compatible with malignant involvement. A precarinal lower paratracheal node measuring 0.8 cm in short axis on image 68/4 has a maximum SUV of 3.9 which is above blood pool and could be an indicator of malignant involvement although is less certain in the other nodal stations. Incidental CT findings: Centrilobular emphysema. Atherosclerotic calcification of the aortic arch and branch vessels. Descending thoracic aortic atherosclerosis. ABDOMEN/PELVIS: Subtle nodularity of the left adrenal gland without discrete mass, maximum SUV 2.8, no compelling findings of adrenal malignancy. Activity along the perineum without CT abnormality, compatible with urinary FDG and mild incontinence. Incidental CT findings: Aortoiliac atherosclerotic vascular disease. Mildly accentuated density centrally in the uterus, nonspecific but possibly from a uterine fibroid, an without accentuated metabolic activity. SKELETON: No significant abnormal hypermetabolic activity in this region. Incidental CT findings: none IMPRESSION: 1. The 6.6 cm right lower lobe mass has a maximum SUV of 18.7, compatible with malignancy. Small hypermetabolic subcarinal and right hilar/infrahilar nodes compatible with malignant involvement. Precarinal/lower paratracheal node measuring 0.8 cm in short axis has low-grade but above blood pool activity and could well represent early involvement. No findings of metastatic disease outside of the chest. 2. Aortic Atherosclerosis  (ICD10-I70.0) and Emphysema (ICD10-J43.9). Electronically Signed   By: Van Clines M.D.   On: 05/10/2019 15:46   DG CHEST PORT 1 VIEW  Result Date: 05/11/2019 CLINICAL DATA:  Cough. Right lower lobe lung mass. Post bronchoscopy today. EXAM: PORTABLE CHEST 1 VIEW COMPARISON:  Radiographs 04/29/2019 and 03/05/2013. CT 04/30/2019. PET-CT 05/10/2019. FINDINGS: 1118 hours. The large right lower lobe mass is grossly stable. The lungs are otherwise clear. There is no pleural effusion or pneumothorax. The heart size and mediastinal contours are stable. Stable old fractures of the right 6 and 8th ribs. IMPRESSION: No evidence of pneumothorax following bronchoscopy. Grossly stable right lower lobe mass. Electronically Signed  By: Richardean Sale M.D.   On: 05/11/2019 11:25

## 2019-05-25 NOTE — Progress Notes (Signed)
74 year old female diagnosed with a lung mass to receive chemotherapy. She is a patient of Dr. Maylon Peppers.  PMH includes Hypothyroidism and Dyspnea.  Medications include Vitamin D, Pepcid, and Synthroid.  Labs include Na 130, Glucose 121.  Height: 4' 11.5". Weight: 117 pounds. UBW: 116 pounds. BMI:23.24.  I met with patient and her husband after chemo education. She reports she started walking 1 mile 5 days weekly. She denies weight changes. Reports she generally does not eat lunch but has been drinking 1 boost daily.  Nutrition Diagnosis: Food and Nutrition Related Knowledge Deficit related to lung mass as evidenced by no prior need for nutrition information.  Intervention: Educated patient to consume small, frequent meals and snacks daily. Encouraged increased protein foods. Provided examples of high protein snacks and provided a fact sheet. Brief education on eating with nausea and vomiting. Questions answered and teach back used. Contact information given.  Monitoring, Evaluation, goals: Patient will tolerate adequate calories and protein for weight maintenance.  Next Visit: To be scheduled as needed.

## 2019-05-25 NOTE — Progress Notes (Signed)
Pharmacist Chemotherapy Monitoring - Initial Assessment    Anticipated start date: 06/01/19  Regimen:  . Are orders appropriate based on the patient's diagnosis, regimen, and cycle? Yes . Does the plan date match the patient's scheduled date? Yes . Is the sequencing of drugs appropriate? Yes . Are the premedications appropriate for the patient's regimen? Yes . Prior Authorization for treatment is: Not Started/Orders written today o If applicable, is the correct biosimilar selected based on the patient's insurance? not applicable  Organ Function and Labs: Marland Kitchen Are dose adjustments needed based on the patient's renal function, hepatic function, or hematologic function? No . Are appropriate labs ordered prior to the start of patient's treatment? Yes . Other organ system assessment, if indicated: N/A . The following baseline labs, if indicated, have been ordered: N/A  Dose Assessment: . Are the drug doses appropriate? Yes . Are the following correct: o Drug concentrations Yes o IV fluid compatible with drug Yes o Administration routes Yes o Timing of therapy Yes . If applicable, does the patient have documented access for treatment and/or plans for port-a-cath placement? yes . If applicable, have lifetime cumulative doses been properly documented and assessed? not applicable Lifetime Dose Tracking  No doses have been documented on this patient for the following tracked chemicals: Doxorubicin, Epirubicin, Idarubicin, Daunorubicin, Mitoxantrone, Bleomycin, Oxaliplatin, Carboplatin, Liposomal Doxorubicin  o   Toxicity Monitoring/Prevention: . The patient has the following take home antiemetics prescribed: Ondansetron, Compazine, Ativan, dexamethasone . The patient has the following take home medications prescribed: N/A . Medication allergies and previous infusion related reactions, if applicable, have been reviewed and addressed. No allergies . The patient's current medication list has been  assessed for drug-drug interactions with their chemotherapy regimen. no significant drug-drug interactions were identified on review.  Order Review: . Are the treatment plan orders signed? Yes . Is the patient scheduled to see a provider prior to their treatment? No  I verify that I have reviewed each item in the above checklist and answered each question accordingly.  [name], [name] 05/25/2019 3:03 PM

## 2019-05-25 NOTE — Op Note (Signed)
NAME: [name], [name] MEDICAL RECORD JQ:[id number] ACCOUNT [id number] DATE OF BIRTH:[birth year] FACILITY: [facility] LOCATION: [facility] PHYSICIAN:Monterius Rolf BServando Snare, MD  OPERATIVE REPORT  DATE OF PROCEDURE:  05/21/2019  PREOPERATIVE DIAGNOSIS:  Right lower lobe lung mass with hypermetabolic right hilar and mediastinal lymph nodes.  POSTOPERATIVE DIAGNOSIS:  Right lower lobe lung mass with hypermetabolic right hilar and mediastinal lymph nodes.  SURGICAL PROCEDURE:  Repeat bronchoscopy with EBUS and mediastinoscopy.  SURGEON:  Lanelle Bal, MD  BRIEF HISTORY:  The patient is a 74 year old female who had had a history of cough for approximately 6 months without hemoptysis.  Ultimately chest x-ray confirmed a large right lower lobe lung mass.  PET scan was done and the patient was referred to the  pulmonary service.  PET scan showed evidence of large right lower lobe hypermetabolic mass with right hilar nodal involvement and hypermetabolic areas in the mediastinum.  A bronchoscopy and EBUS was done by the pulmonary service.  Nodes were negative  by cytology.  Biopsy of the right lower lung mass was negative.  Cytology of the right lower lung mass confirmed squamous cell carcinoma.  Although clinically the patient appeared to have stage III disease there was incongruity with the PET scan findings  and biopsy.  The patient was referred for repeat biopsy.  The risks and options were discussed with the patient in detail.  She was agreeable and signed informed consent.  DESCRIPTION OF PROCEDURE:  The patient underwent general endotracheal anesthesia without incident.  A single lumen endotracheal tube was placed.  We proceeded after appropriate timeout with video bronchoscopy to the subsegmental level in both the right  and left tracheobronchial tree.  No significant endobronchial lesions were noted.  We then placed the EBUS scope and were able to easily identify enlarged 10R nodes and 7 nodes.   Using a 19-gauge aspirating needle, multiple passes were made into the 10R  node and also the 7 nodes.  Initial smears of both of these areas showed lymph node material, but without diagnosis of malignancy.  For this reason, we then proceeded with mediastinoscopy.  The neck was prepped with Betadine and draped in a sterile  manner.  A 2nd timeout was performed.  A small incision was made in suprasternal notch.  Dissection carried down to the pretracheal fascia, which was bluntly dissected into the mediastinum.  A mediastinoscope was introduced in area 7 nodes at the area of  the carina were biopsied.  These were sent for permanent section.  With operative field hemostatic the scopes were removed.  The cervical fascia was closed with interrupted 3-0 Vicryl, 4-0 subcuticular stitch in skin edges.  Dermabond was applied.  The  patient was awakened and extubated in the operating room and transferred to the recovery room for postoperative observation.  CN/NUANCE  D:05/25/2019 T:05/25/2019 JOB:[id number]

## 2019-05-25 NOTE — Progress Notes (Signed)
START ON PATHWAY REGIMEN - Non-Small Cell Lung     Administer weekly:     Paclitaxel      Carboplatin   **Always confirm dose/schedule in your pharmacy ordering system**  Patient Characteristics: Preoperative or Nonsurgical Candidate (Clinical Staging), Stage III - Nonsurgical Candidate (Nonsquamous and Squamous), PS = 0, 1 Therapeutic Status: Preoperative or Nonsurgical Candidate (Clinical Staging) AJCC T Category: cT4 AJCC N Category: cN0 AJCC M Category: cM0 AJCC 8 Stage Grouping: IIIA ECOG Performance Status: 1 Intent of Therapy: Curative Intent, Discussed with Patient

## 2019-05-26 ENCOUNTER — Ambulatory Visit
Admission: RE | Admit: 2019-05-26 | Discharge: 2019-05-26 | Disposition: A | Discharge status: Home / Self Care | Payer: Medicare Other | Source: Ambulatory Visit | Attending: Radiation Oncology | Admitting: Radiation Oncology

## 2019-05-26 ENCOUNTER — Other Ambulatory Visit: Payer: Self-pay

## 2019-05-26 ENCOUNTER — Other Ambulatory Visit: Payer: Self-pay | Admitting: Radiology

## 2019-05-26 ENCOUNTER — Encounter: Payer: Self-pay | Admitting: Radiation Oncology

## 2019-05-26 ENCOUNTER — Encounter: Payer: Self-pay | Admitting: General Practice

## 2019-05-26 VITALS — BP 156/63 | HR 89 | Temp 98.5°F | Resp 26 | Ht 59.5 in | Wt 114.2 lb

## 2019-05-26 DIAGNOSIS — C3491 Malignant neoplasm of unspecified part of right bronchus or lung: Secondary | ICD-10-CM

## 2019-05-26 DIAGNOSIS — Z510 Encounter for antineoplastic radiation therapy: Secondary | ICD-10-CM | POA: Diagnosis not present

## 2019-05-26 DIAGNOSIS — Z87891 Personal history of nicotine dependence: Secondary | ICD-10-CM | POA: Insufficient documentation

## 2019-05-26 DIAGNOSIS — I898 Other specified noninfective disorders of lymphatic vessels and lymph nodes: Secondary | ICD-10-CM | POA: Diagnosis not present

## 2019-05-26 DIAGNOSIS — C3431 Malignant neoplasm of lower lobe, right bronchus or lung: Secondary | ICD-10-CM | POA: Diagnosis not present

## 2019-05-26 DIAGNOSIS — E039 Hypothyroidism, unspecified: Secondary | ICD-10-CM | POA: Diagnosis not present

## 2019-05-26 DIAGNOSIS — R51 Headache with orthostatic component, not elsewhere classified: Secondary | ICD-10-CM | POA: Insufficient documentation

## 2019-05-26 DIAGNOSIS — Z79899 Other long term (current) drug therapy: Secondary | ICD-10-CM | POA: Insufficient documentation

## 2019-05-26 LAB — FERRITIN: Ferritin: 311 ng/mL — ABNORMAL HIGH (ref 11–307)

## 2019-05-26 LAB — IRON AND TIBC
Iron: 22 ug/dL — ABNORMAL LOW (ref 41–142)
Saturation Ratios: 8 % — ABNORMAL LOW (ref 21–57)
TIBC: 288 ug/dL (ref 236–444)
UIBC: 265 ug/dL (ref 120–384)

## 2019-05-26 NOTE — Progress Notes (Signed)
Radiation Oncology         [phone]) [phone] ________________________________  Name: [name]        MRN: [id number]  Date of Service: 05/26/2019 DOB: September 10, 1945  XQ:JJHE, Dwyane Luo, MD  Tish Men, MD     REFERRING PHYSICIAN: Tish Men, MD   DIAGNOSIS: The encounter diagnosis was Squamous cell carcinoma of right lung [facility]).   HISTORY OF PRESENT ILLNESS: [name] is a 74 y.o. female seen at the request of Dr. Maylon Peppers for a newly diagnosed lung cancer.  The patient had been experiencing cough and increasing hoarseness, and proceeded with further work-up, which revealed a large mass in the right lower lobe as well as concern for hilar adenopathy.  A PET scan on 05/10/2019 revealed a 6.6 cm right lower lobe mass with hypermetabolism and an SUV of 18.7, small hypermetabolic subcarinal and right hilar and infrahilar nodes were noted compatible with malignant involvement, precarinal lower paratracheal node was also noted measuring 8 mm with low-grade but above blood pool activity, stigmata emphysematous change and atherosclerotic disease was also noted.  She underwent bronchoscopy with endobronchial ultrasound on 05/11/2019, her FNA of the and lesion revealed a squamous cell carcinoma, her nodes that were sampled labeled 1R,4R, and 7 were all benign.  She did undergo repeat endobronchial ultrasound and biopsy of the level 7 node which was also benign, these findings are considered discordant, she is not a surgical candidate for further mediastinoscopy or lobectomy.  Given the findings, especially radiographically she is considered stage IIIA, and hence would benefit from definitive chemoradiation.  She is planning to begin systemic therapy alongside radiotherapy next Monday.  She also did undergo brain MRI scan on 05/19/2019, and this was negative for disease. [name] is seen today to discuss treatment recommendations.   PREVIOUS RADIATION THERAPY: No   PAST MEDICAL HISTORY:  Past Medical History:   Diagnosis Date  . Dyspnea   . Glaucoma   . Headache   . Hypothyroidism   . Right lower lobe lung mass   . Thyroid condition        PAST SURGICAL HISTORY: Past Surgical History:  Procedure Laterality Date  . BIOPSY  05/11/2019   Procedure: BIOPSY;  Surgeon: Laurin Coder, MD;  Location: [facility];  Service: Pulmonary;;  . BRONCHIAL WASHINGS  05/11/2019   Procedure: BRONCHIAL WASHINGS;  Surgeon: Laurin Coder, MD;  Location: [facility];  Service: Pulmonary;;  . CATARACT EXTRACTION, BILATERAL     1 was a lens implant  . COLONOSCOPY    . FINE NEEDLE ASPIRATION  05/11/2019   Procedure: FINE NEEDLE ASPIRATION (FNA) LINEAR;  Surgeon: Laurin Coder, MD;  Location: [facility];  Service: Pulmonary;;  . MEDIASTINOSCOPY N/A 05/21/2019   Procedure: MEDIASTINOSCOPY;  Surgeon: Grace Isaac, MD;  Location: [facility];  Service: Thoracic;  Laterality: N/A;  . SHOULDER SURGERY    . VIDEO BRONCHOSCOPY WITH ENDOBRONCHIAL ULTRASOUND N/A 05/11/2019   Procedure: VIDEO BRONCHOSCOPY WITH ENDOBRONCHIAL ULTRASOUND;  Surgeon: Laurin Coder, MD;  Location: [place] [facility];  Service: Pulmonary;  Laterality: N/A;  . VIDEO BRONCHOSCOPY WITH ENDOBRONCHIAL ULTRASOUND N/A 05/21/2019   Procedure: VIDEO BRONCHOSCOPY WITH ENDOBRONCHIAL ULTRASOUND;  Surgeon: Grace Isaac, MD;  Location: [facility];  Service: Thoracic;  Laterality: N/A;  . vocal cord polyp removal    . WISDOM TOOTH EXTRACTION       FAMILY HISTORY: History reviewed. No pertinent family history.   SOCIAL HISTORY:  reports that she quit smoking about 9  years ago. Her smoking use included cigarettes. She has never used smokeless tobacco. She reports current alcohol use. She reports that she does not use drugs. The patient is married and lives in [place].    ALLERGIES: Bee venom, Timolol, Brimonidine, and Hydrocodone   MEDICATIONS:  Current Outpatient Medications  Medication Sig Dispense Refill  . Cholecalciferol (VITAMIN D)  50 MCG (2000 UT) tablet Take 2,000 Units by mouth daily with supper.    . dexamethasone (DECADRON) 4 MG tablet Take 2 tablets (8 mg total) by mouth daily. Start the day after chemotherapy for 2 days. 30 tablet 1  . diphenhydrAMINE (BENADRYL) 25 MG tablet Take 50 mg by mouth daily as needed (allergic reaction).    . dorzolamide (TRUSOPT) 2 % ophthalmic solution Place 1 drop into both eyes 2 (two) times daily.     . famotidine (PEPCID) 10 MG tablet Take 10 mg by mouth daily as needed (take with benadryl when stung).    Marland Kitchen levothyroxine (SYNTHROID) 88 MCG tablet Take 1 tablet (88 mcg total) by mouth See admin instructions. Take 44 mcg on Mon and Fri, Take 88 mcg on Sun, Tues, Wed, Thurs, and Sat 30 tablet 1  . lidocaine-prilocaine (EMLA) cream Apply to affected area once 30 g 3  . LORazepam (ATIVAN) 0.5 MG tablet Take 1 tablet (0.5 mg total) by mouth every 6 (six) hours as needed (Nausea or vomiting). 30 tablet 0  . ondansetron (ZOFRAN) 8 MG tablet Take 1 tablet (8 mg total) by mouth 2 (two) times daily as needed for refractory nausea / vomiting. Start on day 3 after chemo. 30 tablet 1  . prochlorperazine (COMPAZINE) 10 MG tablet Take 1 tablet (10 mg total) by mouth every 6 (six) hours as needed (Nausea or vomiting). 30 tablet 1  . travoprost, benzalkonium, (TRAVATAN) 0.004 % ophthalmic solution Place 1 drop into both eyes at bedtime.       No current facility-administered medications for this encounter.     REVIEW OF SYSTEMS: On review of systems, the patient reports that she is doing well overall. She continues to have dry and sometime productive coughing spells and is taking robitussin for this. She denies any chest pain, shortness of breath or hemoptysis, fevers, chills, night sweats, unintended weight changes. She denies any bowel or bladder disturbances, and denies abdominal pain, nausea or vomiting. She denies any new musculoskeletal or joint aches or pains. A complete review of systems is  obtained and is otherwise negative.     PHYSICAL EXAM:  Wt Readings from Last 3 Encounters:  05/26/19 114 lb 4 oz (51.8 kg)  05/25/19 117 lb (53.1 kg)  05/21/19 131 lb (59.4 kg)   Temp Readings from Last 3 Encounters:  05/26/19 98.5 F (36.9 C) (Temporal)  05/25/19 (!) 97.1 F (36.2 C) (Temporal)  05/21/19 98 F (36.7 C)   BP Readings from Last 3 Encounters:  05/26/19 (!) 156/63  05/25/19 (!) 153/61  05/21/19 (!) 111/47   Pulse Readings from Last 3 Encounters:  05/26/19 89  05/25/19 75  05/21/19 91   Pain Assessment Pain Score: 0-No pain/10  In general this is a well appearing caucasian female in no acute distress. She's alert and oriented x4 and appropriate throughout the examination. Cardiopulmonary assessment is negative for acute distress and she exhibits normal effort.    ECOG = 1  0 - Asymptomatic (Fully active, able to carry on all predisease activities without restriction)  1 - Symptomatic but completely ambulatory (Restricted in physically  strenuous activity but ambulatory and able to carry out work of a light or sedentary nature. For example, light housework, office work)  2 - Symptomatic, <50% in bed during the day (Ambulatory and capable of all self care but unable to carry out any work activities. Up and about more than 50% of waking hours)  3 - Symptomatic, >50% in bed, but not bedbound (Capable of only limited self-care, confined to bed or chair 50% or more of waking hours)  4 - Bedbound (Completely disabled. Cannot carry on any self-care. Totally confined to bed or chair)  5 - Death   Eustace Pen MM, Creech RH, Tormey DC, et al. [phone]). "Toxicity and response criteria of the [facility]". [place] Oncol. 5 (6): 649-55    LABORATORY DATA:  Lab Results  Component Value Date   WBC 8.6 05/25/2019   HGB 11.1 (L) 05/25/2019   HCT 34.4 (L) 05/25/2019   MCV 86.6 05/25/2019   PLT 368 05/25/2019   Lab Results  Component Value  Date   NA 130 (L) 05/25/2019   K 4.9 05/25/2019   CL 95 (L) 05/25/2019   CO2 29 05/25/2019   Lab Results  Component Value Date   ALT 26 05/25/2019   AST 9 (L) 05/25/2019   ALKPHOS 113 05/25/2019   BILITOT 0.4 05/25/2019      RADIOGRAPHY: DG Chest 2 View  Result Date: 05/19/2019 CLINICAL DATA:  Lung mass. EXAM: CHEST - 2 VIEW COMPARISON:  Chest x-ray dated May 11, 2019. FINDINGS: The heart size and mediastinal contours are within normal limits. Normal pulmonary vascularity. The lungs remain hyperinflated with emphysematous changes. Grossly unchanged large mass in the posterior right lower lobe. No focal consolidation, pleural effusion, or pneumothorax. No acute osseous abnormality. IMPRESSION: 1. Grossly unchanged large right lower lobe mass. 2. COPD. Electronically Signed   By: Titus Dubin M.D.   On: 05/19/2019 15:55   DG Chest 2 View  Result Date: 04/29/2019 CLINICAL DATA:  Nonproductive cough for 6 months. Shortness of breath. Former smoker. EXAM: CHEST - 2 VIEW COMPARISON:  03/05/2013 FINDINGS: The heart size and mediastinal contours are within normal limits. An irregular masslike opacity is seen in the posterior right lower lobe measuring approximately 8 cm, suspicious for bronchogenic carcinoma. Left lung is clear. No evidence of pleural effusion. IMPRESSION: Irregular masslike opacity in posterior right lower lobe, highly suspicious for bronchogenic carcinoma. Chest CT with contrast is recommended further evaluation. These results will be called to the ordering clinician or representative by the Radiologist Assistant, and communication documented in the PACS or [employer]. Electronically Signed   By: Marlaine Hind M.D.   On: 04/29/2019 10:22   CT CHEST W CONTRAST  Addendum Date: 05/25/2019   ADDENDUM REPORT: 05/25/2019 12:58 ADDENDUM: The original report was by Dr. Keith Rake. The following addendum is by Dr. Van Clines: On image 84/3, I measure the long-axis  diameter of the right lower lobe tumor at 7.0 cm. Electronically Signed   By: Van Clines M.D.   On: 05/25/2019 12:58   Result Date: 05/25/2019 CLINICAL DATA:  Cough. Lung mass right lower lobe lesion seen on chest x-ray. EXAM: CT CHEST WITH CONTRAST TECHNIQUE: Multidetector CT imaging of the chest was performed during intravenous contrast administration. CONTRAST:  80mL ISOVUE-300 IOPAMIDOL (ISOVUE-300) INJECTION 61% COMPARISON:  Chest radiograph 04/29/2019. Chest CTA 03/05/2013 FINDINGS: Cardiovascular: Calcified aortic atherosclerosis. Mild aortic tortuosity without aneurysm. No aortic dissection. Heart is normal in size. No pericardial effusion. There  are coronary artery calcifications. Evaluation on tailored for pulmonary embolus evaluation, however there are no filling defects in the pulmonary arteries to the distal lobar/proximal segmental level. Mediastinum/Nodes: Multiple prominent mildly enlarged right hilar lymph nodes including a 13 mm node series 2, image 70 and 11 mm node series 2, image 83. Additional small infrahilar nodes extend along the bronchovascular bundle. There is an 11 mm lower paratracheal node. Small upper mediastinal nodes measuring up to 7 mm. No left hilar adenopathy. No visualized thyroid nodule. No esophageal wall thickening. Lungs/Pleura: Heterogeneous right lower lobe pulmonary mass measures 6.9 x 4.6 x 5.1 cm with central hypodensity suspicious for necrosis. Margins are irregular and spiculated with surrounding ground-glass opacity. Posterior extent extends to the pleura. Irregular spiculations extend to the minor fissure. Moderate emphysema. No additional pulmonary nodule or mass. Minimal scarring in the anterior lingula. Trace right pleural thickening without frank effusion. Upper Abdomen: Slight left adrenal thickening without dominant nodule. Normal right adrenal gland. No evidence of focal hepatic lesion. The included spleen, pancreas, kidneys, stomach and colon are  unremarkable. Atherosclerosis of upper abdominal aorta. Musculoskeletal: No destructive lytic or focal blastic osseous lesions. No chest wall lesions. IMPRESSION: 1. Heterogeneous right lower lobe pulmonary mass measuring 6.9 x 4.6 x 5.1 cm, highly concerning for primary bronchogenic malignancy. Margins are irregular with central hypodensity/necrosis. 2. Right hilar and lower paratracheal adenopathy, suspicious for nodal metastatic disease. 3. Moderate emphysema. Aortic Atherosclerosis (ICD10-I70.0) and Emphysema (ICD10-J43.9). These results will be called to the ordering clinician or representative by the Radiologist Assistant, and communication documented in the PACS or [employer]. Electronically Signed: By: Keith Rake M.D. On: 04/30/2019 15:38   MR Brain W Wo Contrast  Result Date: 05/20/2019 CLINICAL DATA:  Right lower lung mass EXAM: MRI HEAD WITHOUT AND WITH CONTRAST TECHNIQUE: Multiplanar, multiecho pulse sequences of the brain and surrounding structures were obtained without and with intravenous contrast. CONTRAST:  59mL GADAVIST GADOBUTROL 1 MMOL/ML IV SOLN COMPARISON:  None. FINDINGS: Motion artifact is present. Brain: There is no acute infarction or intracranial hemorrhage. There is no intracranial mass, mass effect, or edema. There is no hydrocephalus or extra-axial fluid collection. Punctate focus of susceptibility in the right parietal white matter is compatible with chronic microhemorrhage or mineralization. Ventricles and sulci are within normal limits in size and configuration there is no abnormal enhancement within the above limitation. Vascular: Major vessel flow voids at the skull base are preserved. Skull and upper cervical spine: Normal marrow signal is preserved. Sinuses/Orbits: Minor patchy mucosal thickening. Orbits are unremarkable. Other: Sella is unremarkable.  Mastoid air cells are clear. IMPRESSION: No evidence of intracranial metastatic disease. Suboptimal evaluation for  subtle lesion due to motion artifact. Electronically Signed   By: Macy Mis M.D.   On: 05/20/2019 10:06   NM PET Image Initial (PI) Skull Base To Thigh  Result Date: 05/10/2019 CLINICAL DATA:  Initial treatment strategy for lung cancer. EXAM: NUCLEAR MEDICINE PET SKULL BASE TO THIGH TECHNIQUE: 6.3 mCi F-18 FDG was injected intravenously. Full-ring PET imaging was performed from the skull base to thigh after the radiotracer. CT data was obtained and used for attenuation correction and anatomic localization. Fasting blood glucose: 98 mg/dl COMPARISON:  CT chest 04/30/2019 FINDINGS: Mediastinal blood pool activity: SUV max 2.0 Liver activity: SUV max NA NECK: Diffuse bilateral thyroid activity suggesting low-grade thyroiditis. There is no appreciable nodule to measure in order to provide a nodule size. No imaging follow up recommended. (Ref: J Am Coll Radiol. 2015 Feb;12(2):  143-50). Incidental CT findings: Bilateral common carotid atherosclerotic calcification. CHEST: The right lower lobe mass measures 6.6 by 4.5 cm on image 43/8 demonstrates central necrosis and a maximum SUV of 18.7. Subcarinal node 0.9 cm in short axis on image 72/4, maximum SUV 5.6, compatible with malignant involvement. Indistinct right hilar and infrahilar adenopathy with maximum SUV 5.0, compatible with malignant involvement. A precarinal lower paratracheal node measuring 0.8 cm in short axis on image 68/4 has a maximum SUV of 3.9 which is above blood pool and could be an indicator of malignant involvement although is less certain in the other nodal stations. Incidental CT findings: Centrilobular emphysema. Atherosclerotic calcification of the aortic arch and branch vessels. Descending thoracic aortic atherosclerosis. ABDOMEN/PELVIS: Subtle nodularity of the left adrenal gland without discrete mass, maximum SUV 2.8, no compelling findings of adrenal malignancy. Activity along the perineum without CT abnormality, compatible with urinary  FDG and mild incontinence. Incidental CT findings: Aortoiliac atherosclerotic vascular disease. Mildly accentuated density centrally in the uterus, nonspecific but possibly from a uterine fibroid, an without accentuated metabolic activity. SKELETON: No significant abnormal hypermetabolic activity in this region. Incidental CT findings: none IMPRESSION: 1. The 6.6 cm right lower lobe mass has a maximum SUV of 18.7, compatible with malignancy. Small hypermetabolic subcarinal and right hilar/infrahilar nodes compatible with malignant involvement. Precarinal/lower paratracheal node measuring 0.8 cm in short axis has low-grade but above blood pool activity and could well represent early involvement. No findings of metastatic disease outside of the chest. 2. Aortic Atherosclerosis (ICD10-I70.0) and Emphysema (ICD10-J43.9). Electronically Signed   By: Van Clines M.D.   On: 05/10/2019 15:46   DG CHEST PORT 1 VIEW  Result Date: 05/11/2019 CLINICAL DATA:  Cough. Right lower lobe lung mass. Post bronchoscopy today. EXAM: PORTABLE CHEST 1 VIEW COMPARISON:  Radiographs 04/29/2019 and 03/05/2013. CT 04/30/2019. PET-CT 05/10/2019. FINDINGS: 1118 hours. The large right lower lobe mass is grossly stable. The lungs are otherwise clear. There is no pleural effusion or pneumothorax. The heart size and mediastinal contours are stable. Stable old fractures of the right 6 and 8th ribs. IMPRESSION: No evidence of pneumothorax following bronchoscopy. Grossly stable right lower lobe mass. Electronically Signed   By: Richardean Sale M.D.   On: 05/11/2019 11:25       IMPRESSION/PLAN: 1. Stage IIIA, cT4N0M0, NSCLC, squamous cell carcinoma of the RLL. Dr. Lisbeth Renshaw discusses the pathology findings and reviews the nature of locally advanced lung disease.  He reviews the rationale for chemoradiation given the patient is not a surgical candidate and her nodal status, but the size of the lesion would also indicate a role for  chemoradiation.  We discussed the risks, benefits, short, and long term effects of radiotherapy, and the patient is interested in proceeding. Dr. Lisbeth Renshaw discusses the delivery and logistics of radiotherapy and anticipates a course of 6 1/2 weeks of radiotherapy. Written consent is obtained and placed in the chart, a copy was provided to the patient.  She will proceed with simulation today, and we anticipate starting treatment on 06/01/2019 to coincide with her chemo.  In a visit lasting 60 minutes, greater than 50% of the time was spent face to face discussing the patient's condition, in preparation for the discussion, and coordinating the patient's care.   The above documentation reflects my direct findings during this shared patient visit. Please see the separate note by Dr. Lisbeth Renshaw on this date for the remainder of the patient's plan of care.    Carola Rhine, PAC

## 2019-05-26 NOTE — Progress Notes (Signed)
Thoracic Location of Tumor / Histology: Non-small cell lung cancer-Squamous cell RLL  Patient presented with history of nonproductive cough which has been going on since October 2020.  Staging form: Lung, AJCC 8th Edition - Clinical: Stage IIIA (cT4, cN0, cM0) - Signed by Tish Men, MD on 05/25/2019  MRI Brain 05/19/2019: No evidence of intracranial metastatic disease.  Suboptimal evaluation for subtle lesion due to motion artifact.  PET 05/10/2019: 1. The 6.6 cm right lower lobe mass has a maximum SUV of 18.7, compatible with malignancy. Small hypermetabolic subcarinal and right hilar/infrahilar nodes compatible with malignant involvement. Precarinal/lower paratracheal node measuring 0.8 cm in short axis has low-grade but above blood pool activity and could well represent early involvement. No findings of metastatic disease outside of the chest.  CT Chest 04/30/2019: Heterogeneous right lower lobe pulmonary mass measuring 6.9 x 4.6 x 5.1 cm, highly concerning for primary bronchogenic malignancy.  Margins are irregular with central hypodensity/necrosis.  Right hilar and lower paratracheal adenopathy, suspicious for nodal metastatic disease.  EBUS: squamous cell carcinoma in the right primary lung mass, but thoracic lymph nodes were negative for metastatic disease.  Chest x ray: right lower lobe lung mass 6 cm in size.  Biopsies of RLL 05/11/2019   Biopsies of Lymph Node 7,  05/21/2019   Tobacco/Marijuana/Snuff/ETOH use: Former smoker, quit 10 years ago.  Past/Anticipated interventions by cardiothoracic surgery, if any:  Dr. Servando Snare 05/21/2019 - patient with large squamous cell carcinoma of the lung - right lower lobe with suspicious lymph nodes from mediastinal nodal involvement but without tissue confirmation following bronchoscopy and EBUS.-With current clinical findings this could be stage IIIb versus  IIB - underlying emphysematous lung changes-  pulmonary function studies- Pulmonary Function  Diagnosis: Severe Obstructive Airways Disease -Emphysematous Type- severity will limit resection possibility with FEV1= .8   I have discussed with the patient and her daughterradiographic findings and have recommended that we proceed with repeat bronchoscopy with EBUS possible mediastinoscopy.   Past/Anticipated interventions by medical oncology, if any:  Dr. Maylon Peppers 05/25/2019 -In summary, CT and PET demonstrated a large right lower lobe lung mass measuring at least 7 cm with suspicious thoracic adenopathy.  MRI brain was negative for metastatic disease. -Unfortunately, the patient's PFTs were poor, and the disease was deemed unresectable.  -Given the unresectable primary right lung malignancy, the standard-of-care approach is definitive chemoradiation, followed by maintenance immunotherapy with durvalumab. -After completing chemoradiation, we will repeat CT to assess disease response.  If stable or improving disease, then she will proceed with durvalumab.   -After extensive discussion, patient expressed understanding and agreed to proceed with the plan  -Port scheduled on 05/27/2019; simulation on 05/26/2019 -We will tentatively start chemotherapy next week, pending the radiation oncology appt.    Signs/Symptoms  Weight changes, if any: Has lost a few pounds, she has started walking a mile a day.  Respiratory complaints, if any: SOB with activities/excitement.  Hemoptysis, if any: Occasional productive cough with clear phlegm, no blood noted.  Pain issues, if any:  No  SAFETY ISSUES:  Prior radiation? No  Pacemaker/ICD? No  Possible current pregnancy?Postmenopausal  Is the patient on methotrexate?  No  Current Complaints / other details:

## 2019-05-26 NOTE — Progress Notes (Signed)
[facility] [facility] Psychosocial Distress Screening Clinical Social Work  Clinical Social Work was referred by distress screening protocol.  The patient scored a 5 on the Psychosocial Distress Thermometer which indicates moderate distress. Clinical Social Worker contacted patient by phone to assess for distress and other psychosocial needs. Unable to reach patient, left VM w brief introduction to [employer] and resources as well as our contact information.  Encouraged her to call back for more information.    ON[facility] DISTRESS SCREENING 05/26/2019  Screening Type Initial Screening  Distress experienced in past week (1-10) 5  Emotional problem type Adjusting to illness  Other Contact via phone: [phone]    Clinical Social Worker follow up needed: Yes.    Will call again, no answer today, left VM  If yes, follow up plan:  Beverely Pace, [place], [place] Social Worker Phone:  [phone] Cell:  [phone]

## 2019-05-27 ENCOUNTER — Ambulatory Visit (HOSPITAL_COMMUNITY)
Admission: RE | Admit: 2019-05-27 | Discharge: 2019-05-27 | Disposition: A | Discharge status: Home / Self Care | Payer: Medicare Other | Source: Ambulatory Visit | Attending: Hematology | Admitting: Hematology

## 2019-05-27 ENCOUNTER — Telehealth: Payer: Self-pay | Admitting: General Practice

## 2019-05-27 ENCOUNTER — Other Ambulatory Visit: Payer: Self-pay

## 2019-05-27 ENCOUNTER — Encounter (HOSPITAL_COMMUNITY): Payer: Self-pay

## 2019-05-27 DIAGNOSIS — Z79899 Other long term (current) drug therapy: Secondary | ICD-10-CM | POA: Diagnosis not present

## 2019-05-27 DIAGNOSIS — C3490 Malignant neoplasm of unspecified part of unspecified bronchus or lung: Secondary | ICD-10-CM | POA: Diagnosis not present

## 2019-05-27 DIAGNOSIS — Z87891 Personal history of nicotine dependence: Secondary | ICD-10-CM | POA: Insufficient documentation

## 2019-05-27 DIAGNOSIS — Z452 Encounter for adjustment and management of vascular access device: Secondary | ICD-10-CM | POA: Diagnosis not present

## 2019-05-27 DIAGNOSIS — E039 Hypothyroidism, unspecified: Secondary | ICD-10-CM | POA: Insufficient documentation

## 2019-05-27 DIAGNOSIS — C3491 Malignant neoplasm of unspecified part of right bronchus or lung: Secondary | ICD-10-CM

## 2019-05-27 DIAGNOSIS — Z79890 Hormone replacement therapy (postmenopausal): Secondary | ICD-10-CM | POA: Insufficient documentation

## 2019-05-27 DIAGNOSIS — C3431 Malignant neoplasm of lower lobe, right bronchus or lung: Secondary | ICD-10-CM | POA: Diagnosis not present

## 2019-05-27 DIAGNOSIS — Z5111 Encounter for antineoplastic chemotherapy: Secondary | ICD-10-CM | POA: Diagnosis not present

## 2019-05-27 HISTORY — PX: IR IMAGING GUIDED PORT INSERTION: IMG5740

## 2019-05-27 LAB — CBC WITH DIFFERENTIAL/PLATELET
Abs Immature Granulocytes: 0.02 10*3/uL (ref 0.00–0.07)
Basophils Absolute: 0.1 10*3/uL (ref 0.0–0.1)
Basophils Relative: 1 %
Eosinophils Absolute: 0.1 10*3/uL (ref 0.0–0.5)
Eosinophils Relative: 2 %
HCT: 36.4 % (ref 36.0–46.0)
Hemoglobin: 11.6 g/dL — ABNORMAL LOW (ref 12.0–15.0)
Immature Granulocytes: 0 %
Lymphocytes Relative: 17 %
Lymphs Abs: 1.1 10*3/uL (ref 0.7–4.0)
MCH: 28.2 pg (ref 26.0–34.0)
MCHC: 31.9 g/dL (ref 30.0–36.0)
MCV: 88.6 fL (ref 80.0–100.0)
Monocytes Absolute: 1.2 10*3/uL — ABNORMAL HIGH (ref 0.1–1.0)
Monocytes Relative: 19 %
Neutro Abs: 3.8 10*3/uL (ref 1.7–7.7)
Neutrophils Relative %: 61 %
Platelets: 371 10*3/uL (ref 150–400)
RBC: 4.11 MIL/uL (ref 3.87–5.11)
RDW: 14.3 % (ref 11.5–15.5)
WBC: 6.3 10*3/uL (ref 4.0–10.5)
nRBC: 0 % (ref 0.0–0.2)

## 2019-05-27 IMAGING — US IR IMAGING GUIDED PORT INSERTION
1 series · 1 of 1 positions shown · non-contrast
Comparison: none

CLINICAL DATA: SQUAMOUS CELL LUNG CARCINOMA, ACCESS FOR
CHEMOTHERAPY

[Series 1: (id) · 1 of 1 slices shown]
[im 1/1]
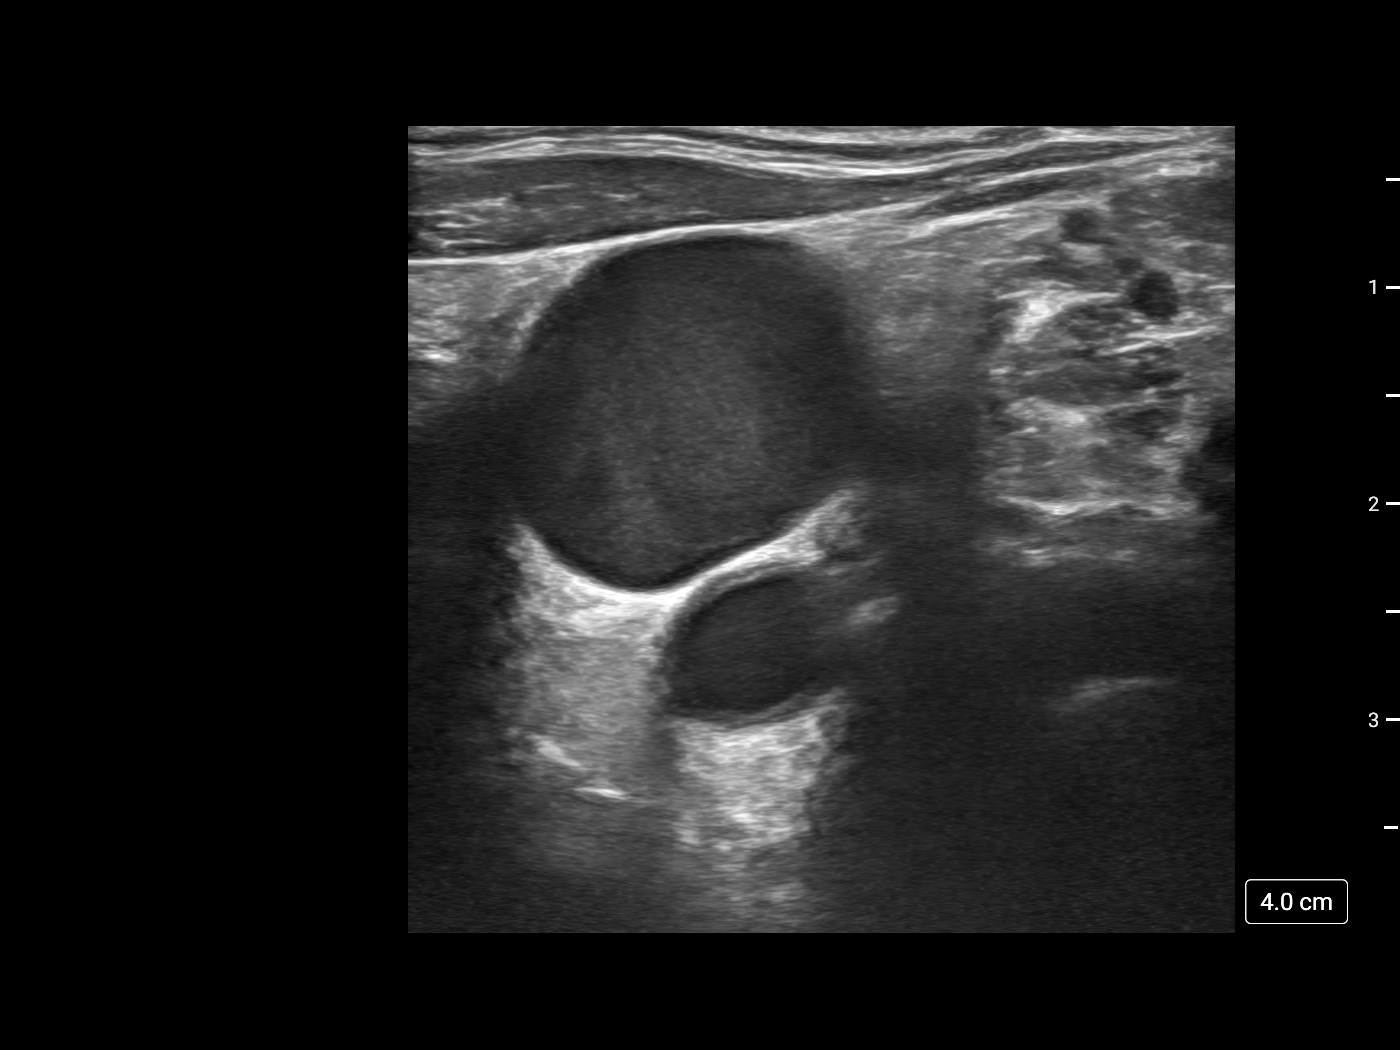

[1 of 1 positions shown; findings below may reference images not displayed]

EXAM:
RIGHT INTERNAL JUGULAR SINGLE LUMEN POWER PORT CATHETER INSERTION

Date:  05/27/2019 05/27/2019 [DATE]

Radiologist:  Alokbe, Oyvind

Guidance:  Ultrasound and fluoroscopic

MEDICATIONS:
Ancef 2 g; The antibiotic was administered within an appropriate
time interval prior to skin puncture.

ANESTHESIA/SEDATION:
Versed 2.0 mg IV; Fentanyl 100 mcg IV;

Moderate Sedation Time:  23 minutes

The patient was continuously monitored during the procedure by the
interventional radiology nurse under my direct supervision.

FLUOROSCOPY TIME:  0 minutes, 18 seconds (1.0 mGy)

COMPLICATIONS:
None immediate.

CONTRAST:  None.

PROCEDURE:
Informed consent was obtained from the patient following explanation
of the procedure, risks, benefits and alternatives. The patient
understands, agrees and consents for the procedure. All questions
were addressed. A time out was performed.

Maximal barrier sterile technique utilized including caps, mask,
sterile gowns, sterile gloves, large sterile drape, hand hygiene,
and 2% chlorhexidine scrub.

Under sterile conditions and local anesthesia, right internal
jugular micropuncture venous access was performed. Access was
performed with ultrasound. Images were obtained for documentation of
the patent right internal jugular vein. A guide wire was inserted
followed by a transitional dilator. This allowed insertion of a
guide wire and catheter into the IVC. Measurements were obtained
from the SVC / RA junction back to the right IJ venotomy site. In
the right infraclavicular chest, a subcutaneous pocket was created
over the second anterior rib. This was done under sterile conditions
and local anesthesia. 1% lidocaine with epinephrine was utilized for
this. A 2.5 cm incision was made in the skin. Blunt dissection was
performed to create a subcutaneous pocket over the right pectoralis
major muscle. The pocket was flushed with saline vigorously. There
was adequate hemostasis. The port catheter was assembled and checked
for leakage. The port catheter was secured in the pocket with two
retention sutures. The tubing was tunneled subcutaneously to the
right venotomy site and inserted into the SVC/RA junction through a
valved peel-away sheath. Position was confirmed with fluoroscopy.
Images were obtained for documentation. The patient tolerated the
procedure well. No immediate complications. Incisions were closed in
a two layer fashion with 4 - 0 Vicryl suture. Dermabond was applied
to the skin. The port catheter was accessed, blood was aspirated
followed by saline and heparin flushes. Needle was removed. A dry
sterile dressing was applied.
IMPRESSION: Ultrasound and fluoroscopically guided right internal jugular single
lumen power port catheter insertion. Tip in the SVC/RA junction.
Catheter ready for use.

## 2019-05-27 MED ORDER — FENTANYL CITRATE (PF) 100 MCG/2ML IJ SOLN
INTRAMUSCULAR | Status: AC
  Filled 2019-05-27: qty 2

## 2019-05-27 MED ORDER — CEFAZOLIN SODIUM-DEXTROSE 2-4 GM/100ML-% IV SOLN: 2.0000 g | INTRAVENOUS | Status: AC

## 2019-05-27 MED ORDER — LIDOCAINE-EPINEPHRINE (PF) 1 %-1:200000 IJ SOLN
INTRAMUSCULAR | Status: AC | PRN
  Administered 2019-05-27: 10 mL

## 2019-05-27 MED ORDER — LIDOCAINE-EPINEPHRINE 1 %-1:100000 IJ SOLN
INTRAMUSCULAR | Status: AC
  Filled 2019-05-27: qty 1

## 2019-05-27 MED ORDER — FENTANYL CITRATE (PF) 100 MCG/2ML IJ SOLN
INTRAMUSCULAR | Status: AC | PRN
  Administered 2019-05-27 (×2): 50 ug via INTRAVENOUS

## 2019-05-27 MED ORDER — MIDAZOLAM HCL 2 MG/2ML IJ SOLN
INTRAMUSCULAR | Status: AC | PRN
  Administered 2019-05-27 (×2): 1 mg via INTRAVENOUS

## 2019-05-27 MED ORDER — SODIUM CHLORIDE 0.9 % IV SOLN: INTRAVENOUS | Status: DC

## 2019-05-27 MED ORDER — CEFAZOLIN SODIUM-DEXTROSE 2-4 GM/100ML-% IV SOLN
INTRAVENOUS | Status: AC
  Administered 2019-05-27: 2 g via INTRAVENOUS
  Filled 2019-05-27: qty 100

## 2019-05-27 MED ORDER — MIDAZOLAM HCL 2 MG/2ML IJ SOLN
INTRAMUSCULAR | Status: AC
  Filled 2019-05-27: qty 2

## 2019-05-27 MED ORDER — HEPARIN SOD (PORK) LOCK FLUSH 100 UNIT/ML IV SOLN
INTRAVENOUS | Status: AC | PRN
  Administered 2019-05-27: 500 [IU] via INTRAVENOUS

## 2019-05-27 MED ORDER — HEPARIN SOD (PORK) LOCK FLUSH 100 UNIT/ML IV SOLN
INTRAVENOUS | Status: AC
  Filled 2019-05-27: qty 5

## 2019-05-27 NOTE — Telephone Encounter (Signed)
[facility] CSW Progress Notes  Called patient to follow up on Distress Screen - she reports she is doing very well, no concerns about transportation, living situation, help at home and similar.  She will contact us at [facility] if needs arise in the course of treatment.  Edwyna Shell, LCSW Clinical Social Worker Phone:  [phone] Cell:  [phone]

## 2019-05-27 NOTE — Procedures (Signed)
Interventional Radiology Procedure Note  Procedure: RT IJ POWER PORT  Complications: None  Estimated Blood Loss: MIN  Findings: TIP SVCRA       

## 2019-05-27 NOTE — H&P (Signed)
Chief Complaint: Patient was seen in consultation today for lung cancer/Port-a-cath placement.  Referring Physician(s): Zhao,Yan  Supervising Physician: Daryll Brod  Patient Status: [facility] - Out-pt  History of Present Illness: [name] is a 74 y.o. female with a past medical history of headaches, hypothyroidism, lung cancer, and glaucoma. 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 Her cancer is managed by Dr. Maylon Peppers. She has tentative plans to begin chemoradiation therapy as management.  IR requested by Dr. Maylon Peppers for possible image-guided Port-a-cath placement. Patient awake and alert laying in bed watching TV with no complaints at this time. Denies fever, chills, chest pain, dyspnea, abdominal pain, or headache.   Past Medical History:  Diagnosis Date  . Dyspnea   . Glaucoma   . Headache   . Hypothyroidism   . Right lower lobe lung mass   . Thyroid condition     Past Surgical History:  Procedure Laterality Date  . BIOPSY  05/11/2019   Procedure: BIOPSY;  Surgeon: Laurin Coder, MD;  Location: [facility];  Service: Pulmonary;;  . BRONCHIAL WASHINGS  05/11/2019   Procedure: BRONCHIAL WASHINGS;  Surgeon: Laurin Coder, MD;  Location: [facility];  Service: Pulmonary;;  . CATARACT EXTRACTION, BILATERAL     1 was a lens implant  . COLONOSCOPY    . FINE NEEDLE ASPIRATION  05/11/2019   Procedure: FINE NEEDLE ASPIRATION (FNA) LINEAR;  Surgeon: Laurin Coder, MD;  Location: [facility];  Service: Pulmonary;;  . MEDIASTINOSCOPY N/A 05/21/2019   Procedure: MEDIASTINOSCOPY;  Surgeon: Grace Isaac, MD;  Location: [facility];  Service: Thoracic;  Laterality: N/A;  . SHOULDER SURGERY    . VIDEO BRONCHOSCOPY WITH ENDOBRONCHIAL ULTRASOUND N/A 05/11/2019   Procedure: VIDEO BRONCHOSCOPY WITH ENDOBRONCHIAL ULTRASOUND;  Surgeon: Laurin Coder, MD;  Location: [facility];  Service: Pulmonary;  Laterality: N/A;  . VIDEO  BRONCHOSCOPY WITH ENDOBRONCHIAL ULTRASOUND N/A 05/21/2019   Procedure: VIDEO BRONCHOSCOPY WITH ENDOBRONCHIAL ULTRASOUND;  Surgeon: Grace Isaac, MD;  Location: [facility] OR;  Service: Thoracic;  Laterality: N/A;  . vocal cord polyp removal    . WISDOM TOOTH EXTRACTION      Allergies: Bee venom, Timolol, Brimonidine, and Hydrocodone  Medications: Prior to Admission medications   Medication Sig Start Date End Date Taking? Authorizing Provider  Cholecalciferol (VITAMIN D) 50 MCG (2000 UT) tablet Take 2,000 Units by mouth daily with supper.   Yes [provider]  dorzolamide (TRUSOPT) 2 % ophthalmic solution Place 1 drop into both eyes 2 (two) times daily.    Yes [provider]  levothyroxine (SYNTHROID) 88 MCG tablet Take 1 tablet (88 mcg total) by mouth See admin instructions. Take 44 mcg on Mon and Fri, Take 88 mcg on Sun, Tues, Wed, Thurs, and Sat 05/11/19  Yes Olalere, Adewale A, MD  dexamethasone (DECADRON) 4 MG tablet Take 2 tablets (8 mg total) by mouth daily. Start the day after chemotherapy for 2 days. 05/25/19   Tish Men, MD  diphenhydrAMINE (BENADRYL) 25 MG tablet Take 50 mg by mouth daily as needed (allergic reaction).    [provider]  famotidine (PEPCID) 10 MG tablet Take 10 mg by mouth daily as needed (take with benadryl when stung).    [provider]  lidocaine-prilocaine (EMLA) cream Apply to affected area once 05/25/19   Tish Men, MD  LORazepam (ATIVAN) 0.5 MG tablet Take 1 tablet (0.5 mg total) by mouth every 6 (six) hours as needed (Nausea  or vomiting). 05/25/19   Tish Men, MD  ondansetron (ZOFRAN) 8 MG tablet Take 1 tablet (8 mg total) by mouth 2 (two) times daily as needed for refractory nausea / vomiting. Start on day 3 after chemo. 05/25/19   Tish Men, MD  prochlorperazine (COMPAZINE) 10 MG tablet Take 1 tablet (10 mg total) by mouth every 6 (six) hours as needed (Nausea or vomiting). 05/25/19   Tish Men, MD  travoprost, benzalkonium,  (TRAVATAN) 0.004 % ophthalmic solution Place 1 drop into both eyes at bedtime.      [provider]     History reviewed. No pertinent family history.  Social History   Socioeconomic History  . Marital status: Married    Spouse name: Not on file  . Number of children: Not on file  . Years of education: Not on file  . Highest education level: Not on file  Occupational History  . Not on file  Tobacco Use  . Smoking status: Former Smoker    Types: Cigarettes    Quit date: 02/13/2010    Years since quitting: 9.2  . Smokeless tobacco: Never Used  . Tobacco comment: Quit in 2011  Substance and Sexual Activity  . Alcohol use: Yes    Comment: very rare, 1 glass of wine maybe every 3-4 weeks  . Drug use: No  . Sexual activity: Not on file  Other Topics Concern  . Not on file  Social History Narrative  . Not on file   Social Determinants of Health   Financial Resource Strain:   . Difficulty of Paying Living Expenses:   Food Insecurity:   . Worried About Charity fundraiser in the Last Year:   . Arboriculturist in the Last Year:   Transportation Needs:   . Film/video editor (Medical):   Marland Kitchen Lack of Transportation (Non-Medical):   Physical Activity:   . Days of Exercise per Week:   . Minutes of Exercise per Session:   Stress:   . Feeling of Stress :   Social Connections:   . Frequency of Communication with Friends and Family:   . Frequency of Social Gatherings with Friends and Family:   . Attends Religious Services:   . Active Member of Clubs or Organizations:   . Attends Archivist Meetings:   Marland Kitchen Marital Status:      Review of Systems: A 12 point ROS discussed and pertinent positives are indicated in the HPI above.  All other systems are negative.  Review of Systems  Constitutional: Negative for chills and fever.  Respiratory: Negative for shortness of breath and wheezing.   Cardiovascular: Negative for chest pain and palpitations.    Gastrointestinal: Negative for abdominal pain.  Neurological: Negative for headaches.  Psychiatric/Behavioral: Negative for behavioral problems and confusion.    Vital Signs: BP (!) 147/61   Pulse 75   Temp 98.1 F (36.7 C) (Oral)   Resp 18   SpO2 98%   Physical Exam Vitals and nursing note reviewed.  Constitutional:      General: She is not in acute distress.    Appearance: Normal appearance.  Cardiovascular:     Rate and Rhythm: Normal rate and regular rhythm.     Heart sounds: Normal heart sounds. No murmur.  Pulmonary:     Effort: Pulmonary effort is normal. No respiratory distress.     Breath sounds: Normal breath sounds. No wheezing.  Skin:    General: Skin is warm and dry.  Neurological:     Mental Status: She is alert and oriented to person, place, and time.  Psychiatric:        Mood and Affect: Mood normal.        Behavior: Behavior normal.      MD Evaluation Airway: WNL Heart: WNL Abdomen: WNL Chest/ Lungs: WNL ASA  Classification: 3 Mallampati/Airway Score: One   Imaging: DG Chest 2 View  Result Date: 05/19/2019 CLINICAL DATA:  Lung mass. EXAM: CHEST - 2 VIEW COMPARISON:  Chest x-ray dated May 11, 2019. FINDINGS: The heart size and mediastinal contours are within normal limits. Normal pulmonary vascularity. The lungs remain hyperinflated with emphysematous changes. Grossly unchanged large mass in the posterior right lower lobe. No focal consolidation, pleural effusion, or pneumothorax. No acute osseous abnormality. IMPRESSION: 1. Grossly unchanged large right lower lobe mass. 2. COPD. Electronically Signed   By: Titus Dubin M.D.   On: 05/19/2019 15:55   DG Chest 2 View  Result Date: 04/29/2019 CLINICAL DATA:  Nonproductive cough for 6 months. Shortness of breath. Former smoker. EXAM: CHEST - 2 VIEW COMPARISON:  03/05/2013 FINDINGS: The heart size and mediastinal contours are within normal limits. An irregular masslike opacity is seen in the  posterior right lower lobe measuring approximately 8 cm, suspicious for bronchogenic carcinoma. Left lung is clear. No evidence of pleural effusion. IMPRESSION: Irregular masslike opacity in posterior right lower lobe, highly suspicious for bronchogenic carcinoma. Chest CT with contrast is recommended further evaluation. These results will be called to the ordering clinician or representative by the Radiologist Assistant, and communication documented in the PACS or [employer]. Electronically Signed   By: Marlaine Hind M.D.   On: 04/29/2019 10:22   CT CHEST W CONTRAST  Addendum Date: 05/25/2019   ADDENDUM REPORT: 05/25/2019 12:58 ADDENDUM: The original report was by Dr. Keith Rake. The following addendum is by Dr. Van Clines: On image 84/3, I measure the long-axis diameter of the right lower lobe tumor at 7.0 cm. Electronically Signed   By: Van Clines M.D.   On: 05/25/2019 12:58   Result Date: 05/25/2019 CLINICAL DATA:  Cough. Lung mass right lower lobe lesion seen on chest x-ray. EXAM: CT CHEST WITH CONTRAST TECHNIQUE: Multidetector CT imaging of the chest was performed during intravenous contrast administration. CONTRAST:  33mL ISOVUE-300 IOPAMIDOL (ISOVUE-300) INJECTION 61% COMPARISON:  Chest radiograph 04/29/2019. Chest CTA 03/05/2013 FINDINGS: Cardiovascular: Calcified aortic atherosclerosis. Mild aortic tortuosity without aneurysm. No aortic dissection. Heart is normal in size. No pericardial effusion. There are coronary artery calcifications. Evaluation on tailored for pulmonary embolus evaluation, however there are no filling defects in the pulmonary arteries to the distal lobar/proximal segmental level. Mediastinum/Nodes: Multiple prominent mildly enlarged right hilar lymph nodes including a 13 mm node series 2, image 70 and 11 mm node series 2, image 83. Additional small infrahilar nodes extend along the bronchovascular bundle. There is an 11 mm lower paratracheal node. Small  upper mediastinal nodes measuring up to 7 mm. No left hilar adenopathy. No visualized thyroid nodule. No esophageal wall thickening. Lungs/Pleura: Heterogeneous right lower lobe pulmonary mass measures 6.9 x 4.6 x 5.1 cm with central hypodensity suspicious for necrosis. Margins are irregular and spiculated with surrounding ground-glass opacity. Posterior extent extends to the pleura. Irregular spiculations extend to the minor fissure. Moderate emphysema. No additional pulmonary nodule or mass. Minimal scarring in the anterior lingula. Trace right pleural thickening without frank effusion. Upper Abdomen: Slight left adrenal thickening without dominant nodule. Normal right adrenal gland. No  evidence of focal hepatic lesion. The included spleen, pancreas, kidneys, stomach and colon are unremarkable. Atherosclerosis of upper abdominal aorta. Musculoskeletal: No destructive lytic or focal blastic osseous lesions. No chest wall lesions. IMPRESSION: 1. Heterogeneous right lower lobe pulmonary mass measuring 6.9 x 4.6 x 5.1 cm, highly concerning for primary bronchogenic malignancy. Margins are irregular with central hypodensity/necrosis. 2. Right hilar and lower paratracheal adenopathy, suspicious for nodal metastatic disease. 3. Moderate emphysema. Aortic Atherosclerosis (ICD10-I70.0) and Emphysema (ICD10-J43.9). These results will be called to the ordering clinician or representative by the Radiologist Assistant, and communication documented in the PACS or [employer]. Electronically Signed: By: Keith Rake M.D. On: 04/30/2019 15:38   MR Brain W Wo Contrast  Result Date: 05/20/2019 CLINICAL DATA:  Right lower lung mass EXAM: MRI HEAD WITHOUT AND WITH CONTRAST TECHNIQUE: Multiplanar, multiecho pulse sequences of the brain and surrounding structures were obtained without and with intravenous contrast. CONTRAST:  25mL GADAVIST GADOBUTROL 1 MMOL/ML IV SOLN COMPARISON:  None. FINDINGS: Motion artifact is present.  Brain: There is no acute infarction or intracranial hemorrhage. There is no intracranial mass, mass effect, or edema. There is no hydrocephalus or extra-axial fluid collection. Punctate focus of susceptibility in the right parietal white matter is compatible with chronic microhemorrhage or mineralization. Ventricles and sulci are within normal limits in size and configuration there is no abnormal enhancement within the above limitation. Vascular: Major vessel flow voids at the skull base are preserved. Skull and upper cervical spine: Normal marrow signal is preserved. Sinuses/Orbits: Minor patchy mucosal thickening. Orbits are unremarkable. Other: Sella is unremarkable.  Mastoid air cells are clear. IMPRESSION: No evidence of intracranial metastatic disease. Suboptimal evaluation for subtle lesion due to motion artifact. Electronically Signed   By: Macy Mis M.D.   On: 05/20/2019 10:06   NM PET Image Initial (PI) Skull Base To Thigh  Result Date: 05/10/2019 CLINICAL DATA:  Initial treatment strategy for lung cancer. EXAM: NUCLEAR MEDICINE PET SKULL BASE TO THIGH TECHNIQUE: 6.3 mCi F-18 FDG was injected intravenously. Full-ring PET imaging was performed from the skull base to thigh after the radiotracer. CT data was obtained and used for attenuation correction and anatomic localization. Fasting blood glucose: 98 mg/dl COMPARISON:  CT chest 04/30/2019 FINDINGS: Mediastinal blood pool activity: SUV max 2.0 Liver activity: SUV max NA NECK: Diffuse bilateral thyroid activity suggesting low-grade thyroiditis. There is no appreciable nodule to measure in order to provide a nodule size. No imaging follow up recommended. (Ref: J Am Coll Radiol. 2015 Feb;12(2): 143-50). Incidental CT findings: Bilateral common carotid atherosclerotic calcification. CHEST: The right lower lobe mass measures 6.6 by 4.5 cm on image 43/8 demonstrates central necrosis and a maximum SUV of 18.7. Subcarinal node 0.9 cm in short axis on  image 72/4, maximum SUV 5.6, compatible with malignant involvement. Indistinct right hilar and infrahilar adenopathy with maximum SUV 5.0, compatible with malignant involvement. A precarinal lower paratracheal node measuring 0.8 cm in short axis on image 68/4 has a maximum SUV of 3.9 which is above blood pool and could be an indicator of malignant involvement although is less certain in the other nodal stations. Incidental CT findings: Centrilobular emphysema. Atherosclerotic calcification of the aortic arch and branch vessels. Descending thoracic aortic atherosclerosis. ABDOMEN/PELVIS: Subtle nodularity of the left adrenal gland without discrete mass, maximum SUV 2.8, no compelling findings of adrenal malignancy. Activity along the perineum without CT abnormality, compatible with urinary FDG and mild incontinence. Incidental CT findings: Aortoiliac atherosclerotic vascular disease. Mildly accentuated density centrally  in the uterus, nonspecific but possibly from a uterine fibroid, an without accentuated metabolic activity. SKELETON: No significant abnormal hypermetabolic activity in this region. Incidental CT findings: none IMPRESSION: 1. The 6.6 cm right lower lobe mass has a maximum SUV of 18.7, compatible with malignancy. Small hypermetabolic subcarinal and right hilar/infrahilar nodes compatible with malignant involvement. Precarinal/lower paratracheal node measuring 0.8 cm in short axis has low-grade but above blood pool activity and could well represent early involvement. No findings of metastatic disease outside of the chest. 2. Aortic Atherosclerosis (ICD10-I70.0) and Emphysema (ICD10-J43.9). Electronically Signed   By: Van Clines M.D.   On: 05/10/2019 15:46   DG CHEST PORT 1 VIEW  Result Date: 05/11/2019 CLINICAL DATA:  Cough. Right lower lobe lung mass. Post bronchoscopy today. EXAM: PORTABLE CHEST 1 VIEW COMPARISON:  Radiographs 04/29/2019 and 03/05/2013. CT 04/30/2019. PET-CT 05/10/2019.  FINDINGS: 1118 hours. The large right lower lobe mass is grossly stable. The lungs are otherwise clear. There is no pleural effusion or pneumothorax. The heart size and mediastinal contours are stable. Stable old fractures of the right 6 and 8th ribs. IMPRESSION: No evidence of pneumothorax following bronchoscopy. Grossly stable right lower lobe mass. Electronically Signed   By: Richardean Sale M.D.   On: 05/11/2019 11:25    Labs:  CBC: Recent Labs    05/11/19 0803 05/19/19 1050 05/25/19 1130 05/27/19 1217  WBC 5.9 5.5 8.6 6.3  HGB 12.2 11.4* 11.1* 11.6*  HCT 39.3 36.7 34.4* 36.4  PLT 323 388 368 371    COAGS: Recent Labs    05/11/19 0803 05/19/19 1050  INR 1.0 1.1  APTT 31 32    BMP: Recent Labs    05/07/19 1146 05/11/19 0803 05/19/19 1050 05/25/19 1130  NA 131* 134* 130* 130*  K 4.2 4.5 3.8 4.9  CL 95* 98 94* 95*  CO2 29 22 26 29   GLUCOSE 102* 84 94 121*  BUN 14 12 8 13   CALCIUM 9.8 9.2 9.0 9.8  CREATININE 0.56 0.41* 0.52 0.52  GFRNONAA >60 >60 >60 >60  GFRAA >60 >60 >60 >60    LIVER FUNCTION TESTS: Recent Labs    05/07/19 1146 05/11/19 0803 05/19/19 1050 05/25/19 1130  BILITOT 0.3 0.8 0.5 0.4  AST 9* 17 15 9*  ALT 19 23 32 26  ALKPHOS 121 118 113 113  PROT 7.5 7.8 7.2 6.9  ALBUMIN 4.2 3.6 3.5 4.0     Assessment and Plan:  Stage III SCC of right lower lung with tentative plans to begin chemoradiation therapy as management. Plan for image-guided Port-a-cath placement today in IR. Patient is NPO. Afebrile and WBCs WNL.  Risks and benefits of image-guided Port-a-catheter placement were discussed with the patient including, but not limited to bleeding, infection, pneumothorax, or fibrin sheath development and need for additional procedures. All of the patient's questions were answered, patient is agreeable to proceed. Consent signed and in chart.   Thank you for this interesting consult.  I greatly enjoyed meeting LATRIA MCCARRON and look  forward to participating in their care.  A copy of this report was sent to the requesting provider on this date.  Electronically Signed: Earley Abide, PA-C 05/27/2019, 1:05 PM   I spent a total of 30 Minutes in face to face in clinical consultation, greater than 50% of which was counseling/coordinating care for lung cancer/Port-a-cath placement.

## 2019-05-27 NOTE — Discharge Instructions (Signed)
Urgent needs - IR on call MD [phone]  Wound - May remove dressing and shower in 24 to 48 hours.  Keep site clean and dry.  Replace with bandaid. Do not submerge in tub or water until site healing well. If ordered by your provider, may start Emla cream in 2 weeks or after incision is healed.   After completion of treatment, your provider should have appointments set up for monthly port flushes.   Moderate Conscious Sedation, Adult, Care After These instructions provide you with information about caring for yourself after your procedure. Your health care provider may also give you more specific instructions. Your treatment has been planned according to current medical practices, but problems sometimes occur. Call your health care provider if you have any problems or questions after your procedure. What can I expect after the procedure? After your procedure, it is common:  To feel sleepy for several hours.  To feel clumsy and have poor balance for several hours.  To have poor judgment for several hours.  To vomit if you eat too soon. Follow these instructions at home: For at least 24 hours after the procedure:  Do not: ? Participate in activities where you could fall or become injured. ? Drive. ? Use heavy machinery. ? Drink alcohol. ? Take sleeping pills or medicines that cause drowsiness. ? Make important decisions or sign legal documents. ? Take care of children on your own.  Rest. Eating and drinking  Follow the diet recommended by your health care provider.  If you vomit: ? Drink water, juice, or soup when you can drink without vomiting. ? Make sure you have little or no nausea before eating solid foods. General instructions  Have a responsible adult stay with you until you are awake and alert.  Take over-the-counter and prescription medicines only as told by your health care provider.  If you smoke, do not smoke without supervision.  Keep all follow-up visits as  told by your health care provider. This is important. Contact a health care provider if:  You keep feeling nauseous or you keep vomiting.  You feel light-headed.  You develop a rash.  You have a fever. Get help right away if:  You have trouble breathing. This information is not intended to replace advice given to you by your health care provider. Make sure you discuss any questions you have with your health care provider. Document Revised: 01/10/2017 Document Reviewed: 05/20/2015 Elsevier Patient Education  [place] Insertion, Care After This sheet gives you information about how to care for yourself after your procedure. Your health care provider may also give you more specific instructions. If you have problems or questions, contact your health care provider. What can I expect after the procedure? After the procedure, it is common to have:  Discomfort at the port insertion site.  Bruising on the skin over the port. This should improve over 3-4 days. Follow these instructions at home: [facility] care  After your port is placed, you will get a manufacturer's information card. The card has information about your port. Keep this card with you at all times.  Take care of the port as told by your health care provider. Ask your health care provider if you or a family member can get training for taking care of the port at home. A home health care nurse may also take care of the port.  Make sure to remember what type of port you have. Incision care  Follow instructions from your health care provider about how to take care of your port insertion site. Make sure you: ? Wash your hands with soap and water before and after you change your bandage (dressing). If soap and water are not available, use hand sanitizer. ? Change your dressing as told by your health care provider. ? Leave stitches (sutures), skin glue, or adhesive strips in place. These skin closures may  need to stay in place for 2 weeks or longer. If adhesive strip edges start to loosen and curl up, you may trim the loose edges. Do not remove adhesive strips completely unless your health care provider tells you to do that.  Check your port insertion site every day for signs of infection. Check for: ? Redness, swelling, or pain. ? Fluid or blood. ? Warmth. ? Pus or a bad smell. Activity  Return to your normal activities as told by your health care provider. Ask your health care provider what activities are safe for you.  Do not lift anything that is heavier than 10 lb (4.5 kg), or the limit that you are told, until your health care provider says that it is safe. General instructions  Take over-the-counter and prescription medicines only as told by your health care provider.  Do not take baths, swim, or use a hot tub until your health care provider approves. Ask your health care provider if you may take showers. You may only be allowed to take sponge baths.  Do not drive for 24 hours if you were given a sedative during your procedure.  Wear a medical alert bracelet in case of an emergency. This will tell any health care providers that you have a port.  Keep all follow-up visits as told by your health care provider. This is important. Contact a health care provider if:  You cannot flush your port with saline as directed, or you cannot draw blood from the port.  You have a fever or chills.  You have redness, swelling, or pain around your port insertion site.  You have fluid or blood coming from your port insertion site.  Your port insertion site feels warm to the touch.  You have pus or a bad smell coming from the port insertion site. Get help right away if:  You have chest pain or shortness of breath.  You have bleeding from your port that you cannot control. Summary  Take care of the port as told by your health care provider. Keep the manufacturer's information card with you  at all times.  Change your dressing as told by your health care provider.  Contact a health care provider if you have a fever or chills or if you have redness, swelling, or pain around your port insertion site.  Keep all follow-up visits as told by your health care provider. This information is not intended to replace advice given to you by your health care provider. Make sure you discuss any questions you have with your health care provider. Document Revised: 08/26/2017 Document Reviewed: 08/26/2017 Elsevier Patient Education  [place].

## 2019-05-31 ENCOUNTER — Telehealth: Payer: Self-pay | Admitting: *Deleted

## 2019-05-31 DIAGNOSIS — Z510 Encounter for antineoplastic radiation therapy: Secondary | ICD-10-CM | POA: Diagnosis not present

## 2019-05-31 DIAGNOSIS — C3431 Malignant neoplasm of lower lobe, right bronchus or lung: Secondary | ICD-10-CM | POA: Diagnosis not present

## 2019-05-31 NOTE — Telephone Encounter (Signed)
Discussed with pt pac and emla cream Pt was instructed NOT to use Emla cream on PAC x 2 weeks .Notified pt we will provide Ice to numb site prior to accessing PAC. No further concerns

## 2019-06-01 ENCOUNTER — Other Ambulatory Visit: Payer: Self-pay

## 2019-06-01 ENCOUNTER — Inpatient Hospital Stay: Payer: Medicare Other

## 2019-06-01 ENCOUNTER — Ambulatory Visit
Admission: RE | Admit: 2019-06-01 | Discharge: 2019-06-01 | Disposition: A | Discharge status: Home / Self Care | Payer: Medicare Other | Source: Ambulatory Visit | Attending: Radiation Oncology | Admitting: Radiation Oncology

## 2019-06-01 ENCOUNTER — Encounter: Payer: Self-pay | Admitting: *Deleted

## 2019-06-01 VITALS — BP 132/59 | HR 70 | Temp 98.0°F | Resp 18

## 2019-06-01 DIAGNOSIS — E871 Hypo-osmolality and hyponatremia: Secondary | ICD-10-CM

## 2019-06-01 DIAGNOSIS — D649 Anemia, unspecified: Secondary | ICD-10-CM | POA: Diagnosis not present

## 2019-06-01 DIAGNOSIS — C3431 Malignant neoplasm of lower lobe, right bronchus or lung: Secondary | ICD-10-CM | POA: Diagnosis not present

## 2019-06-01 DIAGNOSIS — C3491 Malignant neoplasm of unspecified part of right bronchus or lung: Secondary | ICD-10-CM

## 2019-06-01 DIAGNOSIS — Z510 Encounter for antineoplastic radiation therapy: Secondary | ICD-10-CM | POA: Diagnosis not present

## 2019-06-01 DIAGNOSIS — Z5111 Encounter for antineoplastic chemotherapy: Secondary | ICD-10-CM | POA: Diagnosis not present

## 2019-06-01 LAB — CMP (CANCER CENTER ONLY)
ALT: 28 U/L (ref 0–44)
AST: 10 U/L — ABNORMAL LOW (ref 15–41)
Albumin: 4 g/dL (ref 3.5–5.0)
Alkaline Phosphatase: 125 U/L (ref 38–126)
Anion gap: 7 (ref 5–15)
BUN: 11 mg/dL (ref 8–23)
CO2: 27 mmol/L (ref 22–32)
Calcium: 9.5 mg/dL (ref 8.9–10.3)
Chloride: 95 mmol/L — ABNORMAL LOW (ref 98–111)
Creatinine: 0.47 mg/dL (ref 0.44–1.00)
GFR, Est AFR Am: >60 mL/min (ref >60)
GFR, Estimated: >60 mL/min (ref >60)
Glucose, Bld: 175 mg/dL — ABNORMAL HIGH (ref 70–99)
Potassium: 3.8 mmol/L (ref 3.5–5.1)
Sodium: 129 mmol/L — ABNORMAL LOW (ref 135–145)
Total Bilirubin: 0.4 mg/dL (ref 0.3–1.2)
Total Protein: 7.2 g/dL (ref 6.5–8.1)

## 2019-06-01 LAB — CBC WITH DIFFERENTIAL (CANCER CENTER ONLY)
Abs Immature Granulocytes: 0.02 10*3/uL (ref 0.00–0.07)
Basophils Absolute: 0 10*3/uL (ref 0.0–0.1)
Basophils Relative: 0 %
Eosinophils Absolute: 0.1 10*3/uL (ref 0.0–0.5)
Eosinophils Relative: 1 %
HCT: 32.8 % — ABNORMAL LOW (ref 36.0–46.0)
Hemoglobin: 10.8 g/dL — ABNORMAL LOW (ref 12.0–15.0)
Immature Granulocytes: 0 %
Lymphocytes Relative: 16 %
Lymphs Abs: 1.1 10*3/uL (ref 0.7–4.0)
MCH: 28.2 pg (ref 26.0–34.0)
MCHC: 32.9 g/dL (ref 30.0–36.0)
MCV: 85.6 fL (ref 80.0–100.0)
Monocytes Absolute: 0.6 10*3/uL (ref 0.1–1.0)
Monocytes Relative: 8 %
Neutro Abs: 5.3 10*3/uL (ref 1.7–7.7)
Neutrophils Relative %: 75 %
Platelet Count: 315 10*3/uL (ref 150–400)
RBC: 3.83 MIL/uL — ABNORMAL LOW (ref 3.87–5.11)
RDW: 13.8 % (ref 11.5–15.5)
WBC Count: 7.1 10*3/uL (ref 4.0–10.5)
nRBC: 0 % (ref 0.0–0.2)

## 2019-06-01 MED ORDER — DIPHENHYDRAMINE HCL 50 MG/ML IJ SOLN
INTRAMUSCULAR | Status: AC
  Filled 2019-06-01: qty 1

## 2019-06-01 MED ORDER — SODIUM CHLORIDE 0.9 % IV SOLN
Freq: Once | INTRAVENOUS | Status: AC
  Filled 2019-06-01: qty 250

## 2019-06-01 MED ORDER — HEPARIN SOD (PORK) LOCK FLUSH 100 UNIT/ML IV SOLN
500.0000 [IU] | Freq: Once | INTRAVENOUS | Status: AC | PRN
  Administered 2019-06-01: 500 [IU]
  Filled 2019-06-01: qty 5

## 2019-06-01 MED ORDER — PALONOSETRON HCL INJECTION 0.25 MG/5ML
0.2500 mg | Freq: Once | INTRAVENOUS | Status: AC
  Administered 2019-06-01: 0.25 mg via INTRAVENOUS

## 2019-06-01 MED ORDER — SODIUM CHLORIDE 0.9 % IV SOLN
132.8000 mg | Freq: Once | INTRAVENOUS | Status: AC
  Administered 2019-06-01: 130 mg via INTRAVENOUS
  Filled 2019-06-01: qty 13

## 2019-06-01 MED ORDER — SODIUM CHLORIDE 0.9% FLUSH
10.0000 mL | INTRAVENOUS | Status: DC | PRN
  Administered 2019-06-01: 10 mL
  Filled 2019-06-01: qty 10

## 2019-06-01 MED ORDER — FAMOTIDINE IN NACL 20-0.9 MG/50ML-% IV SOLN
20.0000 mg | Freq: Once | INTRAVENOUS | Status: AC
  Administered 2019-06-01: 20 mg via INTRAVENOUS

## 2019-06-01 MED ORDER — DIPHENHYDRAMINE HCL 50 MG/ML IJ SOLN
50.0000 mg | Freq: Once | INTRAMUSCULAR | Status: AC
  Administered 2019-06-01: 50 mg via INTRAVENOUS

## 2019-06-01 MED ORDER — SODIUM CHLORIDE 0.9 % IV SOLN
45.0000 mg/m2 | Freq: Once | INTRAVENOUS | Status: AC
  Administered 2019-06-01: 66 mg via INTRAVENOUS
  Filled 2019-06-01: qty 11

## 2019-06-01 MED ORDER — FAMOTIDINE IN NACL 20-0.9 MG/50ML-% IV SOLN
INTRAVENOUS | Status: AC
  Filled 2019-06-01: qty 50

## 2019-06-01 MED ORDER — PALONOSETRON HCL INJECTION 0.25 MG/5ML
INTRAVENOUS | Status: AC
  Filled 2019-06-01: qty 5

## 2019-06-01 MED ORDER — SODIUM CHLORIDE 0.9 % IV SOLN
20.0000 mg | Freq: Once | INTRAVENOUS | Status: AC
  Administered 2019-06-01: 20 mg via INTRAVENOUS
  Filled 2019-06-01: qty 2

## 2019-06-01 NOTE — Patient Instructions (Addendum)
[facility] Discharge Instructions for Patients Receiving Chemotherapy  Today you received the following chemotherapy agents Carboplatin, Taxol  1) Beginning Wednesday, Take Decadron 2 tablets (8 mg total) by mouth daily. Start the day after chemotherapy for 2 days.  Only take for 2 days  2) You can take Compazine 1 tablet every 6 hours as needed for nausea at any time.  3)  Can also take Ativan 1 tablet every 6 hours as needed for nausea at any time  4) Beginning Friday, you can take Zofran by mouth twice daily for nausea.  To help prevent nausea and vomiting after your treatment, we encourage you to take your nausea medication    If you develop nausea and vomiting that is not controlled by your nausea medication, call the clinic.   BELOW ARE SYMPTOMS THAT SHOULD BE REPORTED IMMEDIATELY:  *FEVER GREATER THAN 100.5 F  *CHILLS WITH OR WITHOUT FEVER  NAUSEA AND VOMITING THAT IS NOT CONTROLLED WITH YOUR NAUSEA MEDICATION  *UNUSUAL SHORTNESS OF BREATH  *UNUSUAL BRUISING OR BLEEDING  TENDERNESS IN MOUTH AND THROAT WITH OR WITHOUT PRESENCE OF ULCERS  *URINARY PROBLEMS  *BOWEL PROBLEMS  UNUSUAL RASH Items with * indicate a potential emergency and should be followed up as soon as possible.  Feel free to call the clinic should you have any questions or concerns. The clinic phone number is [phone].  Please show the [place] at check-in to the Emergency Department and triage nurse.

## 2019-06-01 NOTE — Patient Instructions (Signed)
Implanted Port Insertion, Care After °This sheet gives you information about how to care for yourself after your procedure. Your health care provider may also give you more specific instructions. If you have problems or questions, contact your health care provider. °What can I expect after the procedure? °After the procedure, it is common to have: °· Discomfort at the port insertion site. °· Bruising on the skin over the port. This should improve over 3-4 days. °Follow these instructions at home: °Port care °· After your port is placed, you will get a manufacturer's information card. The card has information about your port. Keep this card with you at all times. °· Take care of the port as told by your health care provider. Ask your health care provider if you or a family member can get training for taking care of the port at home. A home health care nurse may also take care of the port. °· Make sure to remember what type of port you have. °Incision care ° °  ° °· Follow instructions from your health care provider about how to take care of your port insertion site. Make sure you: °? Wash your hands with soap and water before and after you change your bandage (dressing). If soap and water are not available, use hand sanitizer. °? Change your dressing as told by your health care provider. °? Leave stitches (sutures), skin glue, or adhesive strips in place. These skin closures may need to stay in place for 2 weeks or longer. If adhesive strip edges start to loosen and curl up, you may trim the loose edges. Do not remove adhesive strips completely unless your health care provider tells you to do that. °· Check your port insertion site every day for signs of infection. Check for: °? Redness, swelling, or pain. °? Fluid or blood. °? Warmth. °? Pus or a bad smell. °Activity °· Return to your normal activities as told by your health care provider. Ask your health care provider what activities are safe for you. °· Do not  lift anything that is heavier than 10 lb (4.5 kg), or the limit that you are told, until your health care provider says that it is safe. °General instructions °· Take over-the-counter and prescription medicines only as told by your health care provider. °· Do not take baths, swim, or use a hot tub until your health care provider approves. Ask your health care provider if you may take showers. You may only be allowed to take sponge baths. °· Do not drive for 24 hours if you were given a sedative during your procedure. °· Wear a medical alert bracelet in case of an emergency. This will tell any health care providers that you have a port. °· Keep all follow-up visits as told by your health care provider. This is important. °Contact a health care provider if: °· You cannot flush your port with saline as directed, or you cannot draw blood from the port. °· You have a fever or chills. °· You have redness, swelling, or pain around your port insertion site. °· You have fluid or blood coming from your port insertion site. °· Your port insertion site feels warm to the touch. °· You have pus or a bad smell coming from the port insertion site. °Get help right away if: °· You have chest pain or shortness of breath. °· You have bleeding from your port that you cannot control. °Summary °· Take care of the port as told by your health   care provider. Keep the manufacturer's information card with you at all times. °· Change your dressing as told by your health care provider. °· Contact a health care provider if you have a fever or chills or if you have redness, swelling, or pain around your port insertion site. °· Keep all follow-up visits as told by your health care provider. °This information is not intended to replace advice given to you by your health care provider. Make sure you discuss any questions you have with your health care provider. °Document Revised: 08/26/2017 Document Reviewed: 08/26/2017 °Elsevier Patient Education ©  2020 [employer] ° °

## 2019-06-01 NOTE — Progress Notes (Signed)
Pharmacist Chemotherapy Monitoring - Follow Up Assessment    I verify that I have reviewed each item in the below checklist:  . Regimen for the patient is scheduled for the appropriate day and plan matches scheduled date. Marland Kitchen Appropriate non-routine labs are ordered dependent on drug ordered. . If applicable, additional medications reviewed and ordered per protocol based on lifetime cumulative doses and/or treatment regimen.   Plan for follow-up and/or issues identified: No . I-vent associated with next due treatment: No . MD and/or nursing notified: No  Dafna Romo, Jacqlyn Larsen 06/01/2019 1:01 PM

## 2019-06-01 NOTE — Progress Notes (Signed)
Reviewed all labwork with Dr. Maylon Peppers.  Sodium 129  1 L NS hanging concurrently with chemo.  Ok to treat today

## 2019-06-02 ENCOUNTER — Other Ambulatory Visit: Payer: Self-pay

## 2019-06-02 ENCOUNTER — Ambulatory Visit
Admission: RE | Admit: 2019-06-02 | Discharge: 2019-06-02 | Disposition: A | Discharge status: Home / Self Care | Payer: Medicare Other | Source: Ambulatory Visit | Attending: Radiation Oncology | Admitting: Radiation Oncology

## 2019-06-02 DIAGNOSIS — C3431 Malignant neoplasm of lower lobe, right bronchus or lung: Secondary | ICD-10-CM | POA: Diagnosis not present

## 2019-06-02 DIAGNOSIS — Z510 Encounter for antineoplastic radiation therapy: Secondary | ICD-10-CM | POA: Diagnosis not present

## 2019-06-03 ENCOUNTER — Other Ambulatory Visit: Payer: Self-pay

## 2019-06-03 ENCOUNTER — Ambulatory Visit
Admission: RE | Admit: 2019-06-03 | Discharge: 2019-06-03 | Disposition: A | Discharge status: Home / Self Care | Payer: Medicare Other | Source: Ambulatory Visit | Attending: Radiation Oncology | Admitting: Radiation Oncology

## 2019-06-03 DIAGNOSIS — C3431 Malignant neoplasm of lower lobe, right bronchus or lung: Secondary | ICD-10-CM | POA: Diagnosis not present

## 2019-06-03 DIAGNOSIS — Z510 Encounter for antineoplastic radiation therapy: Secondary | ICD-10-CM | POA: Diagnosis not present

## 2019-06-04 ENCOUNTER — Other Ambulatory Visit: Payer: Self-pay

## 2019-06-04 ENCOUNTER — Ambulatory Visit
Admission: RE | Admit: 2019-06-04 | Discharge: 2019-06-04 | Disposition: A | Discharge status: Home / Self Care | Payer: Medicare Other | Source: Ambulatory Visit | Attending: Radiation Oncology | Admitting: Radiation Oncology

## 2019-06-04 DIAGNOSIS — C3491 Malignant neoplasm of unspecified part of right bronchus or lung: Secondary | ICD-10-CM

## 2019-06-04 DIAGNOSIS — Z510 Encounter for antineoplastic radiation therapy: Secondary | ICD-10-CM | POA: Diagnosis not present

## 2019-06-04 DIAGNOSIS — C3431 Malignant neoplasm of lower lobe, right bronchus or lung: Secondary | ICD-10-CM | POA: Diagnosis not present

## 2019-06-04 MED ORDER — SONAFINE EX EMUL
1.0000 "application " | Freq: Once | CUTANEOUS | Status: AC
  Administered 2019-06-04: 1 via TOPICAL

## 2019-06-04 NOTE — Progress Notes (Signed)
Pt here for patient teaching.  Pt given Radiation and You booklet and Sonafine.  Reviewed areas of pertinence such as fatigue, hair loss, skin changes, throat changes, cough and shortness of breath . Pt able to give teach back of to pat skin and use unscented/gentle soap,apply Sonafine bid and avoid applying anything to skin within 4 hours of treatment. Pt verbalizes understanding of information given and will contact nursing with any questions or concerns.     [name]. [name], BSN

## 2019-06-07 ENCOUNTER — Ambulatory Visit
Admission: RE | Admit: 2019-06-07 | Discharge: 2019-06-07 | Disposition: A | Discharge status: Home / Self Care | Payer: Medicare Other | Source: Ambulatory Visit | Attending: Radiation Oncology | Admitting: Radiation Oncology

## 2019-06-07 ENCOUNTER — Other Ambulatory Visit: Payer: Self-pay

## 2019-06-07 DIAGNOSIS — Z510 Encounter for antineoplastic radiation therapy: Secondary | ICD-10-CM | POA: Diagnosis not present

## 2019-06-07 DIAGNOSIS — C3431 Malignant neoplasm of lower lobe, right bronchus or lung: Secondary | ICD-10-CM | POA: Diagnosis not present

## 2019-06-08 ENCOUNTER — Inpatient Hospital Stay (HOSPITAL_BASED_OUTPATIENT_CLINIC_OR_DEPARTMENT_OTHER): Payer: Medicare Other | Admitting: Hematology

## 2019-06-08 ENCOUNTER — Ambulatory Visit
Admission: RE | Admit: 2019-06-08 | Discharge: 2019-06-08 | Disposition: A | Discharge status: Home / Self Care | Payer: Medicare Other | Source: Ambulatory Visit | Attending: Radiation Oncology | Admitting: Radiation Oncology

## 2019-06-08 ENCOUNTER — Inpatient Hospital Stay: Payer: Medicare Other

## 2019-06-08 ENCOUNTER — Other Ambulatory Visit: Payer: Self-pay

## 2019-06-08 ENCOUNTER — Encounter: Payer: Self-pay | Admitting: Hematology

## 2019-06-08 ENCOUNTER — Telehealth: Payer: Self-pay | Admitting: Hematology

## 2019-06-08 VITALS — BP 156/49 | HR 84 | Temp 97.6°F | Resp 18 | Ht 59.5 in | Wt 114.0 lb

## 2019-06-08 DIAGNOSIS — C3431 Malignant neoplasm of lower lobe, right bronchus or lung: Secondary | ICD-10-CM | POA: Diagnosis not present

## 2019-06-08 DIAGNOSIS — E871 Hypo-osmolality and hyponatremia: Secondary | ICD-10-CM

## 2019-06-08 DIAGNOSIS — D649 Anemia, unspecified: Secondary | ICD-10-CM | POA: Diagnosis not present

## 2019-06-08 DIAGNOSIS — C3491 Malignant neoplasm of unspecified part of right bronchus or lung: Secondary | ICD-10-CM | POA: Diagnosis not present

## 2019-06-08 DIAGNOSIS — Z510 Encounter for antineoplastic radiation therapy: Secondary | ICD-10-CM | POA: Diagnosis not present

## 2019-06-08 DIAGNOSIS — Z5111 Encounter for antineoplastic chemotherapy: Secondary | ICD-10-CM | POA: Diagnosis not present

## 2019-06-08 LAB — CBC WITH DIFFERENTIAL (CANCER CENTER ONLY)
Abs Immature Granulocytes: 0.06 10*3/uL (ref 0.00–0.07)
Basophils Absolute: 0 10*3/uL (ref 0.0–0.1)
Basophils Relative: 1 %
Eosinophils Absolute: 0.2 10*3/uL (ref 0.0–0.5)
Eosinophils Relative: 4 %
HCT: 31.4 % — ABNORMAL LOW (ref 36.0–46.0)
Hemoglobin: 10.3 g/dL — ABNORMAL LOW (ref 12.0–15.0)
Immature Granulocytes: 1 %
Lymphocytes Relative: 11 %
Lymphs Abs: 0.6 10*3/uL — ABNORMAL LOW (ref 0.7–4.0)
MCH: 28.2 pg (ref 26.0–34.0)
MCHC: 32.8 g/dL (ref 30.0–36.0)
MCV: 86 fL (ref 80.0–100.0)
Monocytes Absolute: 0.8 10*3/uL (ref 0.1–1.0)
Monocytes Relative: 16 %
Neutro Abs: 3.5 10*3/uL (ref 1.7–7.7)
Neutrophils Relative %: 67 %
Platelet Count: 315 10*3/uL (ref 150–400)
RBC: 3.65 MIL/uL — ABNORMAL LOW (ref 3.87–5.11)
RDW: 14.1 % (ref 11.5–15.5)
WBC Count: 5.2 10*3/uL (ref 4.0–10.5)
nRBC: 0 % (ref 0.0–0.2)

## 2019-06-08 LAB — CMP (CANCER CENTER ONLY)
ALT: 29 U/L (ref 0–44)
AST: 11 U/L — ABNORMAL LOW (ref 15–41)
Albumin: 3.7 g/dL (ref 3.5–5.0)
Alkaline Phosphatase: 99 U/L (ref 38–126)
Anion gap: 6 (ref 5–15)
BUN: 16 mg/dL (ref 8–23)
CO2: 26 mmol/L (ref 22–32)
Calcium: 9 mg/dL (ref 8.9–10.3)
Chloride: 95 mmol/L — ABNORMAL LOW (ref 98–111)
Creatinine: 0.48 mg/dL (ref 0.44–1.00)
GFR, Est AFR Am: >60 mL/min (ref >60)
GFR, Estimated: >60 mL/min (ref >60)
Glucose, Bld: 140 mg/dL — ABNORMAL HIGH (ref 70–99)
Potassium: 3.7 mmol/L (ref 3.5–5.1)
Sodium: 127 mmol/L — ABNORMAL LOW (ref 135–145)
Total Bilirubin: 0.3 mg/dL (ref 0.3–1.2)
Total Protein: 6.4 g/dL — ABNORMAL LOW (ref 6.5–8.1)

## 2019-06-08 MED ORDER — SODIUM CHLORIDE 0.9 % IV SOLN
Freq: Once | INTRAVENOUS | Status: AC
  Filled 2019-06-08: qty 250

## 2019-06-08 MED ORDER — PALONOSETRON HCL INJECTION 0.25 MG/5ML
INTRAVENOUS | Status: AC
  Filled 2019-06-08: qty 5

## 2019-06-08 MED ORDER — PALONOSETRON HCL INJECTION 0.25 MG/5ML
0.2500 mg | Freq: Once | INTRAVENOUS | Status: AC
  Administered 2019-06-08: 0.25 mg via INTRAVENOUS

## 2019-06-08 MED ORDER — DIPHENHYDRAMINE HCL 50 MG/ML IJ SOLN
50.0000 mg | Freq: Once | INTRAMUSCULAR | Status: AC
  Administered 2019-06-08: 50 mg via INTRAVENOUS

## 2019-06-08 MED ORDER — SODIUM CHLORIDE 0.9% FLUSH
10.0000 mL | INTRAVENOUS | Status: DC | PRN
  Administered 2019-06-08: 10 mL
  Filled 2019-06-08: qty 10

## 2019-06-08 MED ORDER — SODIUM CHLORIDE 0.9 % IV SOLN
132.8000 mg | Freq: Once | INTRAVENOUS | Status: AC
  Administered 2019-06-08: 130 mg via INTRAVENOUS
  Filled 2019-06-08: qty 13

## 2019-06-08 MED ORDER — SODIUM CHLORIDE 0.9 % IV SOLN
45.0000 mg/m2 | Freq: Once | INTRAVENOUS | Status: AC
  Administered 2019-06-08: 66 mg via INTRAVENOUS
  Filled 2019-06-08: qty 11

## 2019-06-08 MED ORDER — SODIUM CHLORIDE 0.9 % IV SOLN
20.0000 mg | Freq: Once | INTRAVENOUS | Status: AC
  Administered 2019-06-08: 20 mg via INTRAVENOUS
  Filled 2019-06-08: qty 20

## 2019-06-08 MED ORDER — DIPHENHYDRAMINE HCL 50 MG/ML IJ SOLN
INTRAMUSCULAR | Status: AC
  Filled 2019-06-08: qty 1

## 2019-06-08 MED ORDER — FAMOTIDINE IN NACL 20-0.9 MG/50ML-% IV SOLN
INTRAVENOUS | Status: AC
  Filled 2019-06-08: qty 50

## 2019-06-08 MED ORDER — HEPARIN SOD (PORK) LOCK FLUSH 100 UNIT/ML IV SOLN
500.0000 [IU] | Freq: Once | INTRAVENOUS | Status: AC | PRN
  Administered 2019-06-08: 500 [IU]
  Filled 2019-06-08: qty 5

## 2019-06-08 MED ORDER — FAMOTIDINE IN NACL 20-0.9 MG/50ML-% IV SOLN
20.0000 mg | Freq: Once | INTRAVENOUS | Status: AC
  Administered 2019-06-08: 20 mg via INTRAVENOUS

## 2019-06-08 NOTE — Patient Instructions (Signed)
Carboplatin injection What is this medicine? CARBOPLATIN (KAR boe pla tin) is a chemotherapy drug. It targets fast dividing cells, like cancer cells, and causes these cells to die. This medicine is used to treat ovarian cancer and many other cancers. This medicine may be used for other purposes; ask your health care provider or pharmacist if you have questions. COMMON BRAND NAME(S): Paraplatin What should I tell my health care provider before I take this medicine? They need to know if you have any of these conditions:  blood disorders  hearing problems  kidney disease  recent or ongoing radiation therapy  an unusual or allergic reaction to carboplatin, cisplatin, other chemotherapy, other medicines, foods, dyes, or preservatives  pregnant or trying to get pregnant  breast-feeding How should I use this medicine? This drug is usually given as an infusion into a vein. It is administered in a hospital or clinic by a specially trained health care professional. Talk to your pediatrician regarding the use of this medicine in children. Special care may be needed. Overdosage: If you think you have taken too much of this medicine contact a poison control center or emergency room at once. NOTE: This medicine is only for you. Do not share this medicine with others. What if I miss a dose? It is important not to miss a dose. Call your doctor or health care professional if you are unable to keep an appointment. What may interact with this medicine?  medicines for seizures  medicines to increase blood counts like filgrastim, pegfilgrastim, sargramostim  some antibiotics like amikacin, gentamicin, neomycin, streptomycin, tobramycin  vaccines Talk to your doctor or health care professional before taking any of these medicines:  acetaminophen  aspirin  ibuprofen  ketoprofen  naproxen This list may not describe all possible interactions. Give your health care provider a list of all the  medicines, herbs, non-prescription drugs, or dietary supplements you use. Also tell them if you smoke, drink alcohol, or use illegal drugs. Some items may interact with your medicine. What should I watch for while using this medicine? Your condition will be monitored carefully while you are receiving this medicine. You will need important blood work done while you are taking this medicine. This drug may make you feel generally unwell. This is not uncommon, as chemotherapy can affect healthy cells as well as cancer cells. Report any side effects. Continue your course of treatment even though you feel ill unless your doctor tells you to stop. In some cases, you may be given additional medicines to help with side effects. Follow all directions for their use. Call your doctor or health care professional for advice if you get a fever, chills or sore throat, or other symptoms of a cold or flu. Do not treat yourself. This drug decreases your body's ability to fight infections. Try to avoid being around people who are sick. This medicine may increase your risk to bruise or bleed. Call your doctor or health care professional if you notice any unusual bleeding. Be careful brushing and flossing your teeth or using a toothpick because you may get an infection or bleed more easily. If you have any dental work done, tell your dentist you are receiving this medicine. Avoid taking products that contain aspirin, acetaminophen, ibuprofen, naproxen, or ketoprofen unless instructed by your doctor. These medicines may hide a fever. Do not become pregnant while taking this medicine. Women should inform their doctor if they wish to become pregnant or think they might be pregnant. There is a  potential for serious side effects to an unborn child. Talk to your health care professional or pharmacist for more information. Do not breast-feed an infant while taking this medicine. What side effects may I notice from receiving this  medicine? Side effects that you should report to your doctor or health care professional as soon as possible:  allergic reactions like skin rash, itching or hives, swelling of the face, lips, or tongue  signs of infection - fever or chills, cough, sore throat, pain or difficulty passing urine  signs of decreased platelets or bleeding - bruising, pinpoint red spots on the skin, black, tarry stools, nosebleeds  signs of decreased red blood cells - unusually weak or tired, fainting spells, lightheadedness  breathing problems  changes in hearing  changes in vision  chest pain  high blood pressure  low blood counts - This drug may decrease the number of white blood cells, red blood cells and platelets. You may be at increased risk for infections and bleeding.  nausea and vomiting  pain, swelling, redness or irritation at the injection site  pain, tingling, numbness in the hands or feet  problems with balance, talking, walking  trouble passing urine or change in the amount of urine Side effects that usually do not require medical attention (report to your doctor or health care professional if they continue or are bothersome):  hair loss  loss of appetite  metallic taste in the mouth or changes in taste This list may not describe all possible side effects. Call your doctor for medical advice about side effects. You may report side effects to FDA at 1-800-FDA-1088. Where should I keep my medicine? This drug is given in a hospital or clinic and will not be stored at home. NOTE: This sheet is a summary. It may not cover all possible information. If you have questions about this medicine, talk to your doctor, pharmacist, or health care provider.  2020 [employer] Standard (2007-05-05 14:38:05) Paclitaxel injection What is this medicine? PACLITAXEL (PAK li TAX el) is a chemotherapy drug. It targets fast dividing cells, like cancer cells, and causes these cells to die. This  medicine is used to treat ovarian cancer, breast cancer, lung cancer, Kaposi's sarcoma, and other cancers. This medicine may be used for other purposes; ask your health care provider or pharmacist if you have questions. COMMON BRAND NAME(S): Onxol, Taxol What should I tell my health care provider before I take this medicine? They need to know if you have any of these conditions:  history of irregular heartbeat  liver disease  low blood counts, like low white cell, platelet, or red cell counts  lung or breathing disease, like asthma  tingling of the fingers or toes, or other nerve disorder  an unusual or allergic reaction to paclitaxel, alcohol, polyoxyethylated castor oil, other chemotherapy, other medicines, foods, dyes, or preservatives  pregnant or trying to get pregnant  breast-feeding How should I use this medicine? This drug is given as an infusion into a vein. It is administered in a hospital or clinic by a specially trained health care professional. Talk to your pediatrician regarding the use of this medicine in children. Special care may be needed. Overdosage: If you think you have taken too much of this medicine contact a poison control center or emergency room at once. NOTE: This medicine is only for you. Do not share this medicine with others. What if I miss a dose? It is important not to miss your dose. Call your doctor or  health care professional if you are unable to keep an appointment. What may interact with this medicine? Do not take this medicine with any of the following medications:  disulfiram  metronidazole This medicine may also interact with the following medications:  antiviral medicines for hepatitis, HIV or AIDS  certain antibiotics like erythromycin and clarithromycin  certain medicines for fungal infections like ketoconazole and itraconazole  certain medicines for seizures like carbamazepine, phenobarbital,  phenytoin  gemfibrozil  nefazodone  rifampin  St. John's wort This list may not describe all possible interactions. Give your health care provider a list of all the medicines, herbs, non-prescription drugs, or dietary supplements you use. Also tell them if you smoke, drink alcohol, or use illegal drugs. Some items may interact with your medicine. What should I watch for while using this medicine? Your condition will be monitored carefully while you are receiving this medicine. You will need important blood work done while you are taking this medicine. This medicine can cause serious allergic reactions. To reduce your risk you will need to take other medicine(s) before treatment with this medicine. If you experience allergic reactions like skin rash, itching or hives, swelling of the face, lips, or tongue, tell your doctor or health care professional right away. In some cases, you may be given additional medicines to help with side effects. Follow all directions for their use. This drug may make you feel generally unwell. This is not uncommon, as chemotherapy can affect healthy cells as well as cancer cells. Report any side effects. Continue your course of treatment even though you feel ill unless your doctor tells you to stop. Call your doctor or health care professional for advice if you get a fever, chills or sore throat, or other symptoms of a cold or flu. Do not treat yourself. This drug decreases your body's ability to fight infections. Try to avoid being around people who are sick. This medicine may increase your risk to bruise or bleed. Call your doctor or health care professional if you notice any unusual bleeding. Be careful brushing and flossing your teeth or using a toothpick because you may get an infection or bleed more easily. If you have any dental work done, tell your dentist you are receiving this medicine. Avoid taking products that contain aspirin, acetaminophen, ibuprofen,  naproxen, or ketoprofen unless instructed by your doctor. These medicines may hide a fever. Do not become pregnant while taking this medicine. Women should inform their doctor if they wish to become pregnant or think they might be pregnant. There is a potential for serious side effects to an unborn child. Talk to your health care professional or pharmacist for more information. Do not breast-feed an infant while taking this medicine. Men are advised not to father a child while receiving this medicine. This product may contain alcohol. Ask your pharmacist or healthcare provider if this medicine contains alcohol. Be sure to tell all healthcare providers you are taking this medicine. Certain medicines, like metronidazole and disulfiram, can cause an unpleasant reaction when taken with alcohol. The reaction includes flushing, headache, nausea, vomiting, sweating, and increased thirst. The reaction can last from 30 minutes to several hours. What side effects may I notice from receiving this medicine? Side effects that you should report to your doctor or health care professional as soon as possible:  allergic reactions like skin rash, itching or hives, swelling of the face, lips, or tongue  breathing problems  changes in vision  fast, irregular heartbeat  high or  low blood pressure  mouth sores  pain, tingling, numbness in the hands or feet  signs of decreased platelets or bleeding - bruising, pinpoint red spots on the skin, black, tarry stools, blood in the urine  signs of decreased red blood cells - unusually weak or tired, feeling faint or lightheaded, falls  signs of infection - fever or chills, cough, sore throat, pain or difficulty passing urine  signs and symptoms of liver injury like dark yellow or brown urine; general ill feeling or flu-like symptoms; light-colored stools; loss of appetite; nausea; right upper belly pain; unusually weak or tired; yellowing of the eyes or  skin  swelling of the ankles, feet, hands  unusually slow heartbeat Side effects that usually do not require medical attention (report to your doctor or health care professional if they continue or are bothersome):  diarrhea  hair loss  loss of appetite  muscle or joint pain  nausea, vomiting  pain, redness, or irritation at site where injected  tiredness This list may not describe all possible side effects. Call your doctor for medical advice about side effects. You may report side effects to FDA at 1-800-FDA-1088. Where should I keep my medicine? This drug is given in a hospital or clinic and will not be stored at home. NOTE: This sheet is a summary. It may not cover all possible information. If you have questions about this medicine, talk to your doctor, pharmacist, or health care provider.  2020 [employer] Standard (2016-10-01 13:14:55)

## 2019-06-08 NOTE — Addendum Note (Signed)
Addended by: Tish Men on: 06/08/2019 09:48 AM   Modules accepted: Orders

## 2019-06-08 NOTE — Progress Notes (Addendum)
[name] OFFICE PROGRESS NOTE  Patient Care Team: Lawerance Cruel, MD as PCP - General (Family Medicine) Tish Men, MD as Medical Oncologist (Oncology) Cordelia Poche, RN as Oncology Nurse Navigator  HEME/ONC OVERVIEW: 1. Stage IIIA (cT4N0M0) squamous cell carcinoma of the right lower lung  -04/2019:   Large RLL lung mass ~7x5cm with ipsilateral hilar and paratracheal adenopathy; moderate emphysema   FDG-avid RLL mass 6.6cm with small hypermetabolic activity in the ipsilateral hilar and paratracheal LN's  EBUS w/ LN bx (separately by pulmonary medicine and thoracic surgery): squamous cell carcinoma (R lung), LN's all negative  Not a candidate for surgery due to poor PFT's  -05/2019 - present: definitive chemoradiation with weekly carboplatin/paclitaxel, w/ plan for maintenance durvalumab   TREATMENT SUMMARY:  06/01/2019- present: definitive chemoradiation with weekly carboplatin/Taxol, plan for 7 doses   ASSESSMENT & PLAN:   Stage IIIA (cT4N0M0) squamous cell carcinoma of the right lower lung  -S/p 1 cycle of weekly carboplatin/Taxol concurrent with radiation -Labs reviewed and adequate, proceed with Cycle 2 of weekly carboplatin/Taxol  -Plan for 7 doses -We will plan to repeat CT scan in 2-3 weeks after completing chemoradiation, and if stable or improving disease, then we will proceed with maintenance durvalumab  -PRN anti-emetics: Zofran, Compazine, dexamethasone   Normocytic anemia -Likely multifactorial, including chemotherapy and anemia of chronic disease  -Hgb 10.3 today, stable -Patient denies any symptoms of bleeding -Continue treatment as outlined above  Hyponatremia -Etiology unclear; possibly due to lung cancer  -Na 127 today, slightly lower than last visit; patient denies any symptoms -We will also administer 1L NS today  -Due to persistent hyponatremia despite increase in dietary salt intake, I will refer the patient to nephrology for  further evaluation   Orders Placed This Encounter  Procedures  . Ambulatory referral to Nephrology    Referral Priority:   Urgent    Referral Type:   Consultation    Referral Reason:   Specialty Services Required    Requested Specialty:   Nephrology    Number of Visits Requested:   1   All questions were answered. The patient knows to call the clinic with any problems, questions or concerns. No barriers to learning was detected.  Return in 1 week for Cycle 3 of chemotherapy, and 2 weeks for Cycle 4 of chemotherapy and clinic appt.   Tish Men, MD 4/27/20219:48 AM  CHIEF COMPLAINT: "I am doing fine so far"  INTERVAL HISTORY: [name] returns clinic for follow-up of Stage IIIA squamous cell carcinoma of the right lung on definitive chemoradiation.  Patient reports that she tolerated the first cycle of chemotherapy relatively well, and denies any significant side effects, such as fever, chest pain, dyspnea, abdominal pain, nausea, vomiting, or diarrhea.  Her appetite is reasonable.  Her weight has been stable.  She denies any other complaint today.  REVIEW OF SYSTEMS:   Constitutional: ( - ) fevers, ( - )  chills , ( - ) night sweats Eyes: ( - ) blurriness of vision, ( - ) double vision, ( - ) watery eyes Ears, nose, mouth, throat, and face: ( - ) mucositis, ( - ) sore throat Respiratory: ( - ) cough, ( - ) dyspnea, ( - ) wheezes Cardiovascular: ( - ) palpitation, ( - ) chest discomfort, ( - ) lower extremity swelling Gastrointestinal:  ( - ) nausea, ( - ) heartburn, ( - ) change in bowel habits Skin: ( - ) abnormal skin rashes Lymphatics: ( - )  new lymphadenopathy, ( - ) easy bruising Neurological: ( - ) numbness, ( - ) tingling, ( - ) new weaknesses Behavioral/Psych: ( - ) mood change, ( - ) new changes  All other systems were reviewed with the patient and are negative.  SUMMARY OF ONCOLOGIC HISTORY: Oncology History  Squamous cell carcinoma lung ([place])  04/30/2019 Imaging    CT chest: IMPRESSION: 1. Heterogeneous right lower lobe pulmonary mass measuring 6.9 x 4.6 x 5.1 cm, highly concerning for primary bronchogenic malignancy. Margins are irregular with central hypodensity/necrosis. 2. Right hilar and lower paratracheal adenopathy, suspicious for nodal metastatic disease. 3. Moderate emphysema.   Aortic Atherosclerosis (ICD10-I70.0) and Emphysema (ICD10-J43.9).  ADDENDUM: The original report was by Dr. Keith Rake. The following addendum is by Dr. Van Clines:   On image 84/3, I measure the long-axis diameter of the right lower lobe tumor at 7.0 cm.   05/07/2019 Initial Diagnosis   Squamous cell carcinoma lung ([place])   05/10/2019 Imaging   PET: IMPRESSION: 1. The 6.6 cm right lower lobe mass has a maximum SUV of 18.7, compatible with malignancy. Small hypermetabolic subcarinal and right hilar/infrahilar nodes compatible with malignant involvement. Precarinal/lower paratracheal node measuring 0.8 cm in short axis has low-grade but above blood pool activity and could well represent early involvement. No findings of metastatic disease outside of the chest. 2. Aortic Atherosclerosis (ICD10-I70.0) and Emphysema (ICD10-J43.9).    05/11/2019 Pathology Results   FINAL MICROSCOPIC DIAGNOSIS:   A. LUNG, RIGHT LOWER LOBE, TRANSBRONCHIAL BIOPSY:  - Lung parenchyma with inflammation and mild cytologic atypia.  - See comment.   B. LYMPH NODE, 4R, FINE NEEDLE ASPIRATION: - No malignant cells identified.  C. LYMPH NODE, 7, FINE NEEDLE ASPIRATION: - No malignant cells identified.  D. LYMPH NODE, 11R, FINE NEEDLE ASPIRATION: - No malignant cells identified.  E. LUNG, RLL, FINE NEEDLE ASPIRATION: - Malignant cells consistent with squamous cell carcinoma. - See comment.   05/19/2019 Imaging   MRI brain: IMPRESSION: No evidence of intracranial metastatic disease. Suboptimal evaluation for subtle lesion due to motion artifact.   05/21/2019  Pathology Results   FINAL MICROSCOPIC DIAGNOSIS:   A. LYMPH NODE, 7, BIOPSY:  - Benign lymph node.   A. LYMPH NODE, 7, FINE NEEDLE ASPIRATION:  - No malignant cells identified  - Lymphoid tissue present.   B. LYMPH NODE, 10 R, FINE NEEDLE ASPIRATION:  - No malignant cells identified  - Lymphoid tissue present.    05/25/2019 Cancer Staging   Staging form: Lung, AJCC 8th Edition - Clinical: Stage IIIA (cT4, cN0, cM0) - Signed by Tish Men, MD on 05/25/2019   06/01/2019 -  Chemotherapy   The patient had palonosetron (ALOXI) injection 0.25 mg, 0.25 mg, Intravenous,  Once, 1 of 7 cycles Administration: 0.25 mg (06/01/2019) CARBOplatin (PARAPLATIN) 130 mg in sodium chloride 0.9 % 100 mL chemo infusion, 130 mg (100 % of original dose 132.8 mg), Intravenous,  Once, 1 of 7 cycles Dose modification: 132.8 mg (original dose 132.8 mg, Cycle 1) Administration: 130 mg (06/01/2019) PACLitaxel (TAXOL) 66 mg in sodium chloride 0.9 % 150 mL chemo infusion (</= 80mg /m2), 45 mg/m2 = 66 mg, Intravenous,  Once, 1 of 7 cycles Administration: 66 mg (06/01/2019)  for chemotherapy treatment.      I have reviewed the past medical history, past surgical history, social history and family history with the patient and they are unchanged from previous note.  ALLERGIES:  is allergic to bee venom; timolol; brimonidine; and hydrocodone.  MEDICATIONS:  Current  Outpatient Medications  Medication Sig Dispense Refill  . Cholecalciferol (VITAMIN D) 50 MCG (2000 UT) tablet Take 2,000 Units by mouth daily with supper.    . dexamethasone (DECADRON) 4 MG tablet Take 2 tablets (8 mg total) by mouth daily. Start the day after chemotherapy for 2 days. 30 tablet 1  . diphenhydrAMINE (BENADRYL) 25 MG tablet Take 50 mg by mouth daily as needed (allergic reaction).    . dorzolamide (TRUSOPT) 2 % ophthalmic solution Place 1 drop into both eyes 2 (two) times daily.     . famotidine (PEPCID) 10 MG tablet Take 10 mg by mouth daily as  needed (take with benadryl when stung).    Marland Kitchen levothyroxine (SYNTHROID) 88 MCG tablet Take 1 tablet (88 mcg total) by mouth See admin instructions. Take 44 mcg on Mon and Fri, Take 88 mcg on Sun, Tues, Wed, Thurs, and Sat 30 tablet 1  . lidocaine-prilocaine (EMLA) cream Apply to affected area once 30 g 3  . LORazepam (ATIVAN) 0.5 MG tablet Take 1 tablet (0.5 mg total) by mouth every 6 (six) hours as needed (Nausea or vomiting). 30 tablet 0  . ondansetron (ZOFRAN) 8 MG tablet Take 1 tablet (8 mg total) by mouth 2 (two) times daily as needed for refractory nausea / vomiting. Start on day 3 after chemo. 30 tablet 1  . prochlorperazine (COMPAZINE) 10 MG tablet Take 1 tablet (10 mg total) by mouth every 6 (six) hours as needed (Nausea or vomiting). 30 tablet 1  . travoprost, benzalkonium, (TRAVATAN) 0.004 % ophthalmic solution Place 1 drop into both eyes at bedtime.       No current facility-administered medications for this visit.    PHYSICAL EXAMINATION: ECOG PERFORMANCE STATUS: 1 - Symptomatic but completely ambulatory  Today's Vitals   06/08/19 0922  BP: (!) 156/49  Pulse: 84  Resp: 18  Temp: 97.6 F (36.4 C)  TempSrc: Temporal  SpO2: 100%  Weight: 114 lb 0.6 oz (51.7 kg)  Height: 4' 11.5" (1.511 m)  PainSc: 0-No pain   Body mass index is 22.65 kg/m.  Filed Weights   06/08/19 0922  Weight: 114 lb 0.6 oz (51.7 kg)    GENERAL: alert, no distress and comfortable SKIN: skin color, texture, turgor are normal, no rashes or significant lesions EYES: conjunctiva are pink and non-injected, sclera clear OROPHARYNX: no exudate, no erythema; lips, buccal mucosa, and tongue normal  NECK: supple, non-tender LYMPH:  no palpable lymphadenopathy in the cervical LUNGS: clear to auscultation with normal breathing effort HEART: regular rate & rhythm and no murmurs and no lower extremity edema ABDOMEN: soft, non-tender, non-distended, normal bowel sounds Musculoskeletal: no cyanosis of digits and  no clubbing  PSYCH: alert & oriented x 3, fluent speech  LABORATORY DATA:  I have reviewed the data as listed    Component Value Date/Time   NA 127 (L) 06/08/2019 0907   K 3.7 06/08/2019 0907   CL 95 (L) 06/08/2019 0907   CO2 26 06/08/2019 0907   GLUCOSE 140 (H) 06/08/2019 0907   BUN 16 06/08/2019 0907   CREATININE 0.48 06/08/2019 0907   CALCIUM 9.0 06/08/2019 0907   PROT 6.4 (L) 06/08/2019 0907   ALBUMIN 3.7 06/08/2019 0907   AST 11 (L) 06/08/2019 0907   ALT 29 06/08/2019 0907   ALKPHOS 99 06/08/2019 0907   BILITOT 0.3 06/08/2019 0907   GFRNONAA >60 06/08/2019 0907   GFRAA >60 06/08/2019 0907    No results found for: SPEP, UPEP  Lab Results  Component Value Date   WBC 5.2 06/08/2019   NEUTROABS 3.5 06/08/2019   HGB 10.3 (L) 06/08/2019   HCT 31.4 (L) 06/08/2019   MCV 86.0 06/08/2019   PLT 315 06/08/2019      Chemistry      Component Value Date/Time   NA 127 (L) 06/08/2019 0907   K 3.7 06/08/2019 0907   CL 95 (L) 06/08/2019 0907   CO2 26 06/08/2019 0907   BUN 16 06/08/2019 0907   CREATININE 0.48 06/08/2019 0907      Component Value Date/Time   CALCIUM 9.0 06/08/2019 0907   ALKPHOS 99 06/08/2019 0907   AST 11 (L) 06/08/2019 0907   ALT 29 06/08/2019 0907   BILITOT 0.3 06/08/2019 0907       RADIOGRAPHIC STUDIES: I have personally reviewed the radiological images as listed below and agreed with the findings in the report. DG Chest 2 View  Result Date: 05/19/2019 CLINICAL DATA:  Lung mass. EXAM: CHEST - 2 VIEW COMPARISON:  Chest x-ray dated May 11, 2019. FINDINGS: The heart size and mediastinal contours are within normal limits. Normal pulmonary vascularity. The lungs remain hyperinflated with emphysematous changes. Grossly unchanged large mass in the posterior right lower lobe. No focal consolidation, pleural effusion, or pneumothorax. No acute osseous abnormality. IMPRESSION: 1. Grossly unchanged large right lower lobe mass. 2. COPD. Electronically Signed    By: Titus Dubin M.D.   On: 05/19/2019 15:55   MR Brain W Wo Contrast  Result Date: 05/20/2019 CLINICAL DATA:  Right lower lung mass EXAM: MRI HEAD WITHOUT AND WITH CONTRAST TECHNIQUE: Multiplanar, multiecho pulse sequences of the brain and surrounding structures were obtained without and with intravenous contrast. CONTRAST:  31mL GADAVIST GADOBUTROL 1 MMOL/ML IV SOLN COMPARISON:  None. FINDINGS: Motion artifact is present. Brain: There is no acute infarction or intracranial hemorrhage. There is no intracranial mass, mass effect, or edema. There is no hydrocephalus or extra-axial fluid collection. Punctate focus of susceptibility in the right parietal white matter is compatible with chronic microhemorrhage or mineralization. Ventricles and sulci are within normal limits in size and configuration there is no abnormal enhancement within the above limitation. Vascular: Major vessel flow voids at the skull base are preserved. Skull and upper cervical spine: Normal marrow signal is preserved. Sinuses/Orbits: Minor patchy mucosal thickening. Orbits are unremarkable. Other: Sella is unremarkable.  Mastoid air cells are clear. IMPRESSION: No evidence of intracranial metastatic disease. Suboptimal evaluation for subtle lesion due to motion artifact. Electronically Signed   By: Macy Mis M.D.   On: 05/20/2019 10:06   NM PET Image Initial (PI) Skull Base To Thigh  Result Date: 05/10/2019 CLINICAL DATA:  Initial treatment strategy for lung cancer. EXAM: NUCLEAR MEDICINE PET SKULL BASE TO THIGH TECHNIQUE: 6.3 mCi F-18 FDG was injected intravenously. Full-ring PET imaging was performed from the skull base to thigh after the radiotracer. CT data was obtained and used for attenuation correction and anatomic localization. Fasting blood glucose: 98 mg/dl COMPARISON:  CT chest 04/30/2019 FINDINGS: Mediastinal blood pool activity: SUV max 2.0 Liver activity: SUV max NA NECK: Diffuse bilateral thyroid activity suggesting  low-grade thyroiditis. There is no appreciable nodule to measure in order to provide a nodule size. No imaging follow up recommended. (Ref: J Am Coll Radiol. 2015 Feb;12(2): 143-50). Incidental CT findings: Bilateral common carotid atherosclerotic calcification. CHEST: The right lower lobe mass measures 6.6 by 4.5 cm on image 43/8 demonstrates central necrosis and a maximum SUV of 18.7. Subcarinal node 0.9 cm in short  axis on image 72/4, maximum SUV 5.6, compatible with malignant involvement. Indistinct right hilar and infrahilar adenopathy with maximum SUV 5.0, compatible with malignant involvement. A precarinal lower paratracheal node measuring 0.8 cm in short axis on image 68/4 has a maximum SUV of 3.9 which is above blood pool and could be an indicator of malignant involvement although is less certain in the other nodal stations. Incidental CT findings: Centrilobular emphysema. Atherosclerotic calcification of the aortic arch and branch vessels. Descending thoracic aortic atherosclerosis. ABDOMEN/PELVIS: Subtle nodularity of the left adrenal gland without discrete mass, maximum SUV 2.8, no compelling findings of adrenal malignancy. Activity along the perineum without CT abnormality, compatible with urinary FDG and mild incontinence. Incidental CT findings: Aortoiliac atherosclerotic vascular disease. Mildly accentuated density centrally in the uterus, nonspecific but possibly from a uterine fibroid, an without accentuated metabolic activity. SKELETON: No significant abnormal hypermetabolic activity in this region. Incidental CT findings: none IMPRESSION: 1. The 6.6 cm right lower lobe mass has a maximum SUV of 18.7, compatible with malignancy. Small hypermetabolic subcarinal and right hilar/infrahilar nodes compatible with malignant involvement. Precarinal/lower paratracheal node measuring 0.8 cm in short axis has low-grade but above blood pool activity and could well represent early involvement. No findings of  metastatic disease outside of the chest. 2. Aortic Atherosclerosis (ICD10-I70.0) and Emphysema (ICD10-J43.9). Electronically Signed   By: Van Clines M.D.   On: 05/10/2019 15:46   DG CHEST PORT 1 VIEW  Result Date: 05/11/2019 CLINICAL DATA:  Cough. Right lower lobe lung mass. Post bronchoscopy today. EXAM: PORTABLE CHEST 1 VIEW COMPARISON:  Radiographs 04/29/2019 and 03/05/2013. CT 04/30/2019. PET-CT 05/10/2019. FINDINGS: 1118 hours. The large right lower lobe mass is grossly stable. The lungs are otherwise clear. There is no pleural effusion or pneumothorax. The heart size and mediastinal contours are stable. Stable old fractures of the right 6 and 8th ribs. IMPRESSION: No evidence of pneumothorax following bronchoscopy. Grossly stable right lower lobe mass. Electronically Signed   By: Richardean Sale M.D.   On: 05/11/2019 11:25   IR IMAGING GUIDED PORT INSERTION  Result Date: 05/27/2019 CLINICAL DATA:  SQUAMOUS CELL LUNG CARCINOMA, ACCESS FOR CHEMOTHERAPY EXAM: RIGHT INTERNAL JUGULAR SINGLE LUMEN POWER PORT CATHETER INSERTION Date:  05/27/2019 05/27/2019 2:22 pm Radiologist:  M. Daryll Brod, MD Guidance:  Ultrasound and fluoroscopic MEDICATIONS: Ancef 2 g; The antibiotic was administered within an appropriate time interval prior to skin puncture. ANESTHESIA/SEDATION: Versed 2.0 mg IV; Fentanyl 100 mcg IV; Moderate Sedation Time:  23 minutes The patient was continuously monitored during the procedure by the interventional radiology nurse under my direct supervision. FLUOROSCOPY TIME:  0 minutes, 18 seconds (1.0 mGy) COMPLICATIONS: None immediate. CONTRAST:  None. PROCEDURE: Informed consent was obtained from the patient following explanation of the procedure, risks, benefits and alternatives. The patient understands, agrees and consents for the procedure. All questions were addressed. A time out was performed. Maximal barrier sterile technique utilized including caps, mask, sterile gowns, sterile  gloves, large sterile drape, hand hygiene, and 2% chlorhexidine scrub. Under sterile conditions and local anesthesia, right internal jugular micropuncture venous access was performed. Access was performed with ultrasound. Images were obtained for documentation of the patent right internal jugular vein. A guide wire was inserted followed by a transitional dilator. This allowed insertion of a guide wire and catheter into the IVC. Measurements were obtained from the SVC / RA junction back to the right IJ venotomy site. In the right infraclavicular chest, a subcutaneous pocket was created over the second anterior rib.  This was done under sterile conditions and local anesthesia. 1% lidocaine with epinephrine was utilized for this. A 2.5 cm incision was made in the skin. Blunt dissection was performed to create a subcutaneous pocket over the right pectoralis major muscle. The pocket was flushed with saline vigorously. There was adequate hemostasis. The port catheter was assembled and checked for leakage. The port catheter was secured in the pocket with two retention sutures. The tubing was tunneled subcutaneously to the right venotomy site and inserted into the SVC/RA junction through a valved peel-away sheath. Position was confirmed with fluoroscopy. Images were obtained for documentation. The patient tolerated the procedure well. No immediate complications. Incisions were closed in a two layer fashion with 4 - 0 Vicryl suture. Dermabond was applied to the skin. The port catheter was accessed, blood was aspirated followed by saline and heparin flushes. Needle was removed. A dry sterile dressing was applied. IMPRESSION: Ultrasound and fluoroscopically guided right internal jugular single lumen power port catheter insertion. Tip in the SVC/RA junction. Catheter ready for use. Electronically Signed   By: Jerilynn Mages.  Shick M.D.   On: 05/27/2019 14:39

## 2019-06-08 NOTE — Patient Instructions (Signed)
Implanted Port Insertion, Care After °This sheet gives you information about how to care for yourself after your procedure. Your health care provider may also give you more specific instructions. If you have problems or questions, contact your health care provider. °What can I expect after the procedure? °After the procedure, it is common to have: °· Discomfort at the port insertion site. °· Bruising on the skin over the port. This should improve over 3-4 days. °Follow these instructions at home: °Port care °· After your port is placed, you will get a manufacturer's information card. The card has information about your port. Keep this card with you at all times. °· Take care of the port as told by your health care provider. Ask your health care provider if you or a family member can get training for taking care of the port at home. A home health care nurse may also take care of the port. °· Make sure to remember what type of port you have. °Incision care ° °  ° °· Follow instructions from your health care provider about how to take care of your port insertion site. Make sure you: °? Wash your hands with soap and water before and after you change your bandage (dressing). If soap and water are not available, use hand sanitizer. °? Change your dressing as told by your health care provider. °? Leave stitches (sutures), skin glue, or adhesive strips in place. These skin closures may need to stay in place for 2 weeks or longer. If adhesive strip edges start to loosen and curl up, you may trim the loose edges. Do not remove adhesive strips completely unless your health care provider tells you to do that. °· Check your port insertion site every day for signs of infection. Check for: °? Redness, swelling, or pain. °? Fluid or blood. °? Warmth. °? Pus or a bad smell. °Activity °· Return to your normal activities as told by your health care provider. Ask your health care provider what activities are safe for you. °· Do not  lift anything that is heavier than 10 lb (4.5 kg), or the limit that you are told, until your health care provider says that it is safe. °General instructions °· Take over-the-counter and prescription medicines only as told by your health care provider. °· Do not take baths, swim, or use a hot tub until your health care provider approves. Ask your health care provider if you may take showers. You may only be allowed to take sponge baths. °· Do not drive for 24 hours if you were given a sedative during your procedure. °· Wear a medical alert bracelet in case of an emergency. This will tell any health care providers that you have a port. °· Keep all follow-up visits as told by your health care provider. This is important. °Contact a health care provider if: °· You cannot flush your port with saline as directed, or you cannot draw blood from the port. °· You have a fever or chills. °· You have redness, swelling, or pain around your port insertion site. °· You have fluid or blood coming from your port insertion site. °· Your port insertion site feels warm to the touch. °· You have pus or a bad smell coming from the port insertion site. °Get help right away if: °· You have chest pain or shortness of breath. °· You have bleeding from your port that you cannot control. °Summary °· Take care of the port as told by your health   care provider. Keep the manufacturer's information card with you at all times. °· Change your dressing as told by your health care provider. °· Contact a health care provider if you have a fever or chills or if you have redness, swelling, or pain around your port insertion site. °· Keep all follow-up visits as told by your health care provider. °This information is not intended to replace advice given to you by your health care provider. Make sure you discuss any questions you have with your health care provider. °Document Revised: 08/26/2017 Document Reviewed: 08/26/2017 °Elsevier Patient Education ©  2020 [employer] ° °

## 2019-06-08 NOTE — Telephone Encounter (Signed)
Per 4/27 los No change in appts

## 2019-06-09 ENCOUNTER — Ambulatory Visit
Admission: RE | Admit: 2019-06-09 | Discharge: 2019-06-09 | Disposition: A | Discharge status: Home / Self Care | Payer: Medicare Other | Source: Ambulatory Visit | Attending: Radiation Oncology | Admitting: Radiation Oncology

## 2019-06-09 ENCOUNTER — Other Ambulatory Visit: Payer: Self-pay

## 2019-06-09 ENCOUNTER — Encounter: Payer: Self-pay | Admitting: *Deleted

## 2019-06-09 DIAGNOSIS — C3431 Malignant neoplasm of lower lobe, right bronchus or lung: Secondary | ICD-10-CM | POA: Diagnosis not present

## 2019-06-09 DIAGNOSIS — Z510 Encounter for antineoplastic radiation therapy: Secondary | ICD-10-CM | POA: Diagnosis not present

## 2019-06-10 ENCOUNTER — Other Ambulatory Visit: Payer: Self-pay

## 2019-06-10 ENCOUNTER — Ambulatory Visit
Admission: RE | Admit: 2019-06-10 | Discharge: 2019-06-10 | Disposition: A | Discharge status: Home / Self Care | Payer: Medicare Other | Source: Ambulatory Visit | Attending: Radiation Oncology | Admitting: Radiation Oncology

## 2019-06-10 DIAGNOSIS — Z510 Encounter for antineoplastic radiation therapy: Secondary | ICD-10-CM | POA: Diagnosis not present

## 2019-06-10 DIAGNOSIS — C3431 Malignant neoplasm of lower lobe, right bronchus or lung: Secondary | ICD-10-CM | POA: Diagnosis not present

## 2019-06-11 ENCOUNTER — Ambulatory Visit
Admission: RE | Admit: 2019-06-11 | Discharge: 2019-06-11 | Disposition: A | Discharge status: Home / Self Care | Payer: Medicare Other | Source: Ambulatory Visit | Attending: Radiation Oncology | Admitting: Radiation Oncology

## 2019-06-11 ENCOUNTER — Other Ambulatory Visit: Payer: Self-pay

## 2019-06-11 DIAGNOSIS — Z510 Encounter for antineoplastic radiation therapy: Secondary | ICD-10-CM | POA: Diagnosis not present

## 2019-06-11 DIAGNOSIS — C3431 Malignant neoplasm of lower lobe, right bronchus or lung: Secondary | ICD-10-CM | POA: Diagnosis not present

## 2019-06-14 ENCOUNTER — Other Ambulatory Visit: Payer: Self-pay

## 2019-06-14 ENCOUNTER — Ambulatory Visit
Admission: RE | Admit: 2019-06-14 | Discharge: 2019-06-14 | Disposition: A | Discharge status: Home / Self Care | Payer: Medicare Other | Source: Ambulatory Visit | Attending: Radiation Oncology | Admitting: Radiation Oncology

## 2019-06-14 DIAGNOSIS — C3431 Malignant neoplasm of lower lobe, right bronchus or lung: Secondary | ICD-10-CM | POA: Diagnosis not present

## 2019-06-14 DIAGNOSIS — Z510 Encounter for antineoplastic radiation therapy: Secondary | ICD-10-CM | POA: Diagnosis not present

## 2019-06-15 ENCOUNTER — Other Ambulatory Visit: Payer: Self-pay

## 2019-06-15 ENCOUNTER — Inpatient Hospital Stay: Payer: Medicare Other | Attending: Hematology

## 2019-06-15 ENCOUNTER — Inpatient Hospital Stay: Payer: Medicare Other

## 2019-06-15 ENCOUNTER — Ambulatory Visit
Admission: RE | Admit: 2019-06-15 | Discharge: 2019-06-15 | Disposition: A | Discharge status: Home / Self Care | Payer: Medicare Other | Source: Ambulatory Visit | Attending: Radiation Oncology | Admitting: Radiation Oncology

## 2019-06-15 DIAGNOSIS — D701 Agranulocytosis secondary to cancer chemotherapy: Secondary | ICD-10-CM | POA: Insufficient documentation

## 2019-06-15 DIAGNOSIS — Z5111 Encounter for antineoplastic chemotherapy: Secondary | ICD-10-CM | POA: Diagnosis not present

## 2019-06-15 DIAGNOSIS — C3431 Malignant neoplasm of lower lobe, right bronchus or lung: Secondary | ICD-10-CM | POA: Diagnosis not present

## 2019-06-15 DIAGNOSIS — C3491 Malignant neoplasm of unspecified part of right bronchus or lung: Secondary | ICD-10-CM

## 2019-06-15 DIAGNOSIS — Z510 Encounter for antineoplastic radiation therapy: Secondary | ICD-10-CM | POA: Diagnosis not present

## 2019-06-15 LAB — CBC WITH DIFFERENTIAL (CANCER CENTER ONLY)
Abs Immature Granulocytes: 0.04 10*3/uL (ref 0.00–0.07)
Basophils Absolute: 0 10*3/uL (ref 0.0–0.1)
Basophils Relative: 1 %
Eosinophils Absolute: 0.1 10*3/uL (ref 0.0–0.5)
Eosinophils Relative: 2 %
HCT: 30.9 % — ABNORMAL LOW (ref 36.0–46.0)
Hemoglobin: 10.1 g/dL — ABNORMAL LOW (ref 12.0–15.0)
Immature Granulocytes: 1 %
Lymphocytes Relative: 8 %
Lymphs Abs: 0.3 10*3/uL — ABNORMAL LOW (ref 0.7–4.0)
MCH: 28.1 pg (ref 26.0–34.0)
MCHC: 32.7 g/dL (ref 30.0–36.0)
MCV: 85.8 fL (ref 80.0–100.0)
Monocytes Absolute: 0.6 10*3/uL (ref 0.1–1.0)
Monocytes Relative: 16 %
Neutro Abs: 2.7 10*3/uL (ref 1.7–7.7)
Neutrophils Relative %: 72 %
Platelet Count: 243 10*3/uL (ref 150–400)
RBC: 3.6 MIL/uL — ABNORMAL LOW (ref 3.87–5.11)
RDW: 14.6 % (ref 11.5–15.5)
WBC Count: 3.7 10*3/uL — ABNORMAL LOW (ref 4.0–10.5)
nRBC: 0 % (ref 0.0–0.2)

## 2019-06-15 LAB — CMP (CANCER CENTER ONLY)
ALT: 21 U/L (ref 0–44)
AST: 9 U/L — ABNORMAL LOW (ref 15–41)
Albumin: 3.6 g/dL (ref 3.5–5.0)
Alkaline Phosphatase: 107 U/L (ref 38–126)
Anion gap: 7 (ref 5–15)
BUN: 14 mg/dL (ref 8–23)
CO2: 26 mmol/L (ref 22–32)
Calcium: 9 mg/dL (ref 8.9–10.3)
Chloride: 95 mmol/L — ABNORMAL LOW (ref 98–111)
Creatinine: 0.45 mg/dL (ref 0.44–1.00)
GFR, Est AFR Am: >60 mL/min (ref >60)
GFR, Estimated: >60 mL/min (ref >60)
Glucose, Bld: 130 mg/dL — ABNORMAL HIGH (ref 70–99)
Potassium: 3.8 mmol/L (ref 3.5–5.1)
Sodium: 128 mmol/L — ABNORMAL LOW (ref 135–145)
Total Bilirubin: 0.4 mg/dL (ref 0.3–1.2)
Total Protein: 6.2 g/dL — ABNORMAL LOW (ref 6.5–8.1)

## 2019-06-15 MED ORDER — FAMOTIDINE IN NACL 20-0.9 MG/50ML-% IV SOLN
INTRAVENOUS | Status: AC
  Filled 2019-06-15: qty 50

## 2019-06-15 MED ORDER — SODIUM CHLORIDE 0.9 % IV SOLN
132.8000 mg | Freq: Once | INTRAVENOUS | Status: AC
  Administered 2019-06-15: 130 mg via INTRAVENOUS
  Filled 2019-06-15: qty 13

## 2019-06-15 MED ORDER — SODIUM CHLORIDE 0.9 % IV SOLN
Freq: Once | INTRAVENOUS | Status: AC
  Filled 2019-06-15: qty 250

## 2019-06-15 MED ORDER — SODIUM CHLORIDE 0.9 % IV SOLN
45.0000 mg/m2 | Freq: Once | INTRAVENOUS | Status: AC
  Administered 2019-06-15: 66 mg via INTRAVENOUS
  Filled 2019-06-15: qty 11

## 2019-06-15 MED ORDER — SODIUM CHLORIDE 0.9 % IV SOLN
20.0000 mg | Freq: Once | INTRAVENOUS | Status: AC
  Administered 2019-06-15: 20 mg via INTRAVENOUS
  Filled 2019-06-15: qty 2

## 2019-06-15 MED ORDER — DIPHENHYDRAMINE HCL 50 MG/ML IJ SOLN
50.0000 mg | Freq: Once | INTRAMUSCULAR | Status: AC
  Administered 2019-06-15: 50 mg via INTRAVENOUS

## 2019-06-15 MED ORDER — PALONOSETRON HCL INJECTION 0.25 MG/5ML
INTRAVENOUS | Status: AC
  Filled 2019-06-15: qty 5

## 2019-06-15 MED ORDER — FAMOTIDINE IN NACL 20-0.9 MG/50ML-% IV SOLN
20.0000 mg | Freq: Once | INTRAVENOUS | Status: AC
  Administered 2019-06-15: 20 mg via INTRAVENOUS

## 2019-06-15 MED ORDER — PALONOSETRON HCL INJECTION 0.25 MG/5ML
0.2500 mg | Freq: Once | INTRAVENOUS | Status: AC
  Administered 2019-06-15: 0.25 mg via INTRAVENOUS

## 2019-06-15 MED ORDER — DIPHENHYDRAMINE HCL 50 MG/ML IJ SOLN
INTRAMUSCULAR | Status: AC
  Filled 2019-06-15: qty 1

## 2019-06-15 MED ORDER — SODIUM CHLORIDE 0.9% FLUSH
10.0000 mL | INTRAVENOUS | Status: DC | PRN
  Administered 2019-06-15: 10 mL
  Filled 2019-06-15: qty 10

## 2019-06-15 MED ORDER — HEPARIN SOD (PORK) LOCK FLUSH 100 UNIT/ML IV SOLN
500.0000 [IU] | Freq: Once | INTRAVENOUS | Status: AC | PRN
  Administered 2019-06-15: 500 [IU]
  Filled 2019-06-15: qty 5

## 2019-06-15 NOTE — Patient Instructions (Signed)
Implanted Port Insertion, Care After °This sheet gives you information about how to care for yourself after your procedure. Your health care provider may also give you more specific instructions. If you have problems or questions, contact your health care provider. °What can I expect after the procedure? °After the procedure, it is common to have: °· Discomfort at the port insertion site. °· Bruising on the skin over the port. This should improve over 3-4 days. °Follow these instructions at home: °Port care °· After your port is placed, you will get a manufacturer's information card. The card has information about your port. Keep this card with you at all times. °· Take care of the port as told by your health care provider. Ask your health care provider if you or a family member can get training for taking care of the port at home. A home health care nurse may also take care of the port. °· Make sure to remember what type of port you have. °Incision care ° °  ° °· Follow instructions from your health care provider about how to take care of your port insertion site. Make sure you: °? Wash your hands with soap and water before and after you change your bandage (dressing). If soap and water are not available, use hand sanitizer. °? Change your dressing as told by your health care provider. °? Leave stitches (sutures), skin glue, or adhesive strips in place. These skin closures may need to stay in place for 2 weeks or longer. If adhesive strip edges start to loosen and curl up, you may trim the loose edges. Do not remove adhesive strips completely unless your health care provider tells you to do that. °· Check your port insertion site every day for signs of infection. Check for: °? Redness, swelling, or pain. °? Fluid or blood. °? Warmth. °? Pus or a bad smell. °Activity °· Return to your normal activities as told by your health care provider. Ask your health care provider what activities are safe for you. °· Do not  lift anything that is heavier than 10 lb (4.5 kg), or the limit that you are told, until your health care provider says that it is safe. °General instructions °· Take over-the-counter and prescription medicines only as told by your health care provider. °· Do not take baths, swim, or use a hot tub until your health care provider approves. Ask your health care provider if you may take showers. You may only be allowed to take sponge baths. °· Do not drive for 24 hours if you were given a sedative during your procedure. °· Wear a medical alert bracelet in case of an emergency. This will tell any health care providers that you have a port. °· Keep all follow-up visits as told by your health care provider. This is important. °Contact a health care provider if: °· You cannot flush your port with saline as directed, or you cannot draw blood from the port. °· You have a fever or chills. °· You have redness, swelling, or pain around your port insertion site. °· You have fluid or blood coming from your port insertion site. °· Your port insertion site feels warm to the touch. °· You have pus or a bad smell coming from the port insertion site. °Get help right away if: °· You have chest pain or shortness of breath. °· You have bleeding from your port that you cannot control. °Summary °· Take care of the port as told by your health   care provider. Keep the manufacturer's information card with you at all times. °· Change your dressing as told by your health care provider. °· Contact a health care provider if you have a fever or chills or if you have redness, swelling, or pain around your port insertion site. °· Keep all follow-up visits as told by your health care provider. °This information is not intended to replace advice given to you by your health care provider. Make sure you discuss any questions you have with your health care provider. °Document Revised: 08/26/2017 Document Reviewed: 08/26/2017 °Elsevier Patient Education ©  2020 [employer] ° °

## 2019-06-15 NOTE — Patient Instructions (Signed)
[facility] Discharge Instructions for Patients Receiving Chemotherapy  Today you received the following chemotherapy agents Carboplatin, Taxol  To help prevent nausea and vomiting after your treatment, we encourage you to take your nausea medication    If you develop nausea and vomiting that is not controlled by your nausea medication, call the clinic.   BELOW ARE SYMPTOMS THAT SHOULD BE REPORTED IMMEDIATELY:  *FEVER GREATER THAN 100.5 F  *CHILLS WITH OR WITHOUT FEVER  NAUSEA AND VOMITING THAT IS NOT CONTROLLED WITH YOUR NAUSEA MEDICATION  *UNUSUAL SHORTNESS OF BREATH  *UNUSUAL BRUISING OR BLEEDING  TENDERNESS IN MOUTH AND THROAT WITH OR WITHOUT PRESENCE OF ULCERS  *URINARY PROBLEMS  *BOWEL PROBLEMS  UNUSUAL RASH Items with * indicate a potential emergency and should be followed up as soon as possible.  Feel free to call the clinic should you have any questions or concerns. The clinic phone number is [phone].  Please show the CHEMO ALERT CARD at check-in to the Emergency Department and triage nurse.   

## 2019-06-15 NOTE — Progress Notes (Signed)
Pharmacist Chemotherapy Monitoring - Follow Up Assessment    I verify that I have reviewed each item in the below checklist:  . Regimen for the patient is scheduled for the appropriate day and plan matches scheduled date. Marland Kitchen Appropriate non-routine labs are ordered dependent on drug ordered. . If applicable, additional medications reviewed and ordered per protocol based on lifetime cumulative doses and/or treatment regimen.   Plan for follow-up and/or issues identified: No . I-vent associated with next due treatment: No . MD and/or nursing notified: No  [name], Jacqlyn Larsen 06/15/2019 1:13 PM

## 2019-06-16 ENCOUNTER — Other Ambulatory Visit: Payer: Self-pay

## 2019-06-16 ENCOUNTER — Ambulatory Visit
Admission: RE | Admit: 2019-06-16 | Discharge: 2019-06-16 | Disposition: A | Discharge status: Home / Self Care | Payer: Medicare Other | Source: Ambulatory Visit | Attending: Radiation Oncology | Admitting: Radiation Oncology

## 2019-06-16 DIAGNOSIS — Z510 Encounter for antineoplastic radiation therapy: Secondary | ICD-10-CM | POA: Diagnosis not present

## 2019-06-16 DIAGNOSIS — C3431 Malignant neoplasm of lower lobe, right bronchus or lung: Secondary | ICD-10-CM | POA: Diagnosis not present

## 2019-06-17 ENCOUNTER — Other Ambulatory Visit: Payer: Self-pay

## 2019-06-17 ENCOUNTER — Ambulatory Visit
Admission: RE | Admit: 2019-06-17 | Discharge: 2019-06-17 | Disposition: A | Discharge status: Home / Self Care | Payer: Medicare Other | Source: Ambulatory Visit | Attending: Radiation Oncology | Admitting: Radiation Oncology

## 2019-06-17 DIAGNOSIS — C3431 Malignant neoplasm of lower lobe, right bronchus or lung: Secondary | ICD-10-CM | POA: Diagnosis not present

## 2019-06-17 DIAGNOSIS — Z510 Encounter for antineoplastic radiation therapy: Secondary | ICD-10-CM | POA: Diagnosis not present

## 2019-06-18 ENCOUNTER — Other Ambulatory Visit: Payer: Self-pay

## 2019-06-18 ENCOUNTER — Ambulatory Visit
Admission: RE | Admit: 2019-06-18 | Discharge: 2019-06-18 | Disposition: A | Discharge status: Home / Self Care | Payer: Medicare Other | Source: Ambulatory Visit | Attending: Radiation Oncology | Admitting: Radiation Oncology

## 2019-06-18 DIAGNOSIS — C3431 Malignant neoplasm of lower lobe, right bronchus or lung: Secondary | ICD-10-CM | POA: Diagnosis not present

## 2019-06-18 DIAGNOSIS — Z510 Encounter for antineoplastic radiation therapy: Secondary | ICD-10-CM | POA: Diagnosis not present

## 2019-06-20 ENCOUNTER — Ambulatory Visit: Payer: Medicare Other

## 2019-06-21 ENCOUNTER — Other Ambulatory Visit: Payer: Self-pay

## 2019-06-21 ENCOUNTER — Ambulatory Visit
Admission: RE | Admit: 2019-06-21 | Discharge: 2019-06-21 | Disposition: A | Discharge status: Home / Self Care | Payer: Medicare Other | Source: Ambulatory Visit | Attending: Radiation Oncology | Admitting: Radiation Oncology

## 2019-06-21 DIAGNOSIS — Z510 Encounter for antineoplastic radiation therapy: Secondary | ICD-10-CM | POA: Diagnosis not present

## 2019-06-21 DIAGNOSIS — C3431 Malignant neoplasm of lower lobe, right bronchus or lung: Secondary | ICD-10-CM | POA: Diagnosis not present

## 2019-06-22 ENCOUNTER — Encounter: Payer: Self-pay | Admitting: Hematology

## 2019-06-22 ENCOUNTER — Ambulatory Visit
Admission: RE | Admit: 2019-06-22 | Discharge: 2019-06-22 | Disposition: A | Discharge status: Home / Self Care | Payer: Medicare Other | Source: Ambulatory Visit | Attending: Radiation Oncology | Admitting: Radiation Oncology

## 2019-06-22 ENCOUNTER — Inpatient Hospital Stay: Payer: Medicare Other

## 2019-06-22 ENCOUNTER — Other Ambulatory Visit: Payer: Self-pay

## 2019-06-22 ENCOUNTER — Encounter: Payer: Self-pay | Admitting: *Deleted

## 2019-06-22 ENCOUNTER — Inpatient Hospital Stay (HOSPITAL_BASED_OUTPATIENT_CLINIC_OR_DEPARTMENT_OTHER): Payer: Medicare Other | Admitting: Hematology

## 2019-06-22 VITALS — HR 88 | Temp 97.7°F | Resp 18 | Wt 113.0 lb

## 2019-06-22 DIAGNOSIS — E871 Hypo-osmolality and hyponatremia: Secondary | ICD-10-CM

## 2019-06-22 DIAGNOSIS — D701 Agranulocytosis secondary to cancer chemotherapy: Secondary | ICD-10-CM | POA: Diagnosis not present

## 2019-06-22 DIAGNOSIS — C3491 Malignant neoplasm of unspecified part of right bronchus or lung: Secondary | ICD-10-CM

## 2019-06-22 DIAGNOSIS — C3431 Malignant neoplasm of lower lobe, right bronchus or lung: Secondary | ICD-10-CM | POA: Diagnosis not present

## 2019-06-22 DIAGNOSIS — Z510 Encounter for antineoplastic radiation therapy: Secondary | ICD-10-CM | POA: Diagnosis not present

## 2019-06-22 DIAGNOSIS — T451X5A Adverse effect of antineoplastic and immunosuppressive drugs, initial encounter: Secondary | ICD-10-CM

## 2019-06-22 DIAGNOSIS — K208 Other esophagitis without bleeding: Secondary | ICD-10-CM

## 2019-06-22 DIAGNOSIS — Z5111 Encounter for antineoplastic chemotherapy: Secondary | ICD-10-CM | POA: Diagnosis not present

## 2019-06-22 DIAGNOSIS — T66XXXA Radiation sickness, unspecified, initial encounter: Secondary | ICD-10-CM

## 2019-06-22 LAB — CMP (CANCER CENTER ONLY)
ALT: 21 U/L (ref 0–44)
AST: 9 U/L — ABNORMAL LOW (ref 15–41)
Albumin: 3.7 g/dL (ref 3.5–5.0)
Alkaline Phosphatase: 108 U/L (ref 38–126)
Anion gap: 8 (ref 5–15)
BUN: 15 mg/dL (ref 8–23)
CO2: 24 mmol/L (ref 22–32)
Calcium: 8.7 mg/dL — ABNORMAL LOW (ref 8.9–10.3)
Chloride: 96 mmol/L — ABNORMAL LOW (ref 98–111)
Creatinine: 0.47 mg/dL (ref 0.44–1.00)
GFR, Est AFR Am: >60 mL/min
GFR, Estimated: >60 mL/min
Glucose, Bld: 157 mg/dL — ABNORMAL HIGH (ref 70–99)
Potassium: 3.7 mmol/L (ref 3.5–5.1)
Sodium: 128 mmol/L — ABNORMAL LOW (ref 135–145)
Total Bilirubin: 0.3 mg/dL (ref 0.3–1.2)
Total Protein: 6.1 g/dL — ABNORMAL LOW (ref 6.5–8.1)

## 2019-06-22 LAB — CBC WITH DIFFERENTIAL (CANCER CENTER ONLY)
Abs Immature Granulocytes: 0.05 10*3/uL (ref 0.00–0.07)
Basophils Absolute: 0 10*3/uL (ref 0.0–0.1)
Basophils Relative: 1 %
Eosinophils Absolute: 0.1 10*3/uL (ref 0.0–0.5)
Eosinophils Relative: 3 %
HCT: 32.3 % — ABNORMAL LOW (ref 36.0–46.0)
Hemoglobin: 10.4 g/dL — ABNORMAL LOW (ref 12.0–15.0)
Immature Granulocytes: 2 %
Lymphocytes Relative: 8 %
Lymphs Abs: 0.3 10*3/uL — ABNORMAL LOW (ref 0.7–4.0)
MCH: 27.9 pg (ref 26.0–34.0)
MCHC: 32.2 g/dL (ref 30.0–36.0)
MCV: 86.6 fL (ref 80.0–100.0)
Monocytes Absolute: 0.4 10*3/uL (ref 0.1–1.0)
Monocytes Relative: 14 %
Neutro Abs: 2.2 10*3/uL (ref 1.7–7.7)
Neutrophils Relative %: 72 %
Platelet Count: 220 10*3/uL (ref 150–400)
RBC: 3.73 MIL/uL — ABNORMAL LOW (ref 3.87–5.11)
RDW: 15.5 % (ref 11.5–15.5)
WBC Count: 3 10*3/uL — ABNORMAL LOW (ref 4.0–10.5)
nRBC: 0 % (ref 0.0–0.2)

## 2019-06-22 MED ORDER — FAMOTIDINE IN NACL 20-0.9 MG/50ML-% IV SOLN
20.0000 mg | Freq: Once | INTRAVENOUS | Status: AC
  Administered 2019-06-22: 20 mg via INTRAVENOUS

## 2019-06-22 MED ORDER — SODIUM CHLORIDE 0.9% FLUSH
10.0000 mL | INTRAVENOUS | Status: DC | PRN
  Administered 2019-06-22: 10 mL
  Filled 2019-06-22: qty 10

## 2019-06-22 MED ORDER — SODIUM CHLORIDE 0.9 % IV SOLN
45.0000 mg/m2 | Freq: Once | INTRAVENOUS | Status: AC
  Administered 2019-06-22: 66 mg via INTRAVENOUS
  Filled 2019-06-22: qty 11

## 2019-06-22 MED ORDER — DIPHENHYDRAMINE HCL 50 MG/ML IJ SOLN
50.0000 mg | Freq: Once | INTRAMUSCULAR | Status: AC
  Administered 2019-06-22: 50 mg via INTRAVENOUS

## 2019-06-22 MED ORDER — PALONOSETRON HCL INJECTION 0.25 MG/5ML
0.2500 mg | Freq: Once | INTRAVENOUS | Status: AC
  Administered 2019-06-22: 0.25 mg via INTRAVENOUS

## 2019-06-22 MED ORDER — PALONOSETRON HCL INJECTION 0.25 MG/5ML
INTRAVENOUS | Status: AC
  Filled 2019-06-22: qty 5

## 2019-06-22 MED ORDER — SUCRALFATE 1 G PO TABS: 1.0000 g | ORAL_TABLET | Freq: Three times a day (TID) | ORAL | 2 refills | Status: DC

## 2019-06-22 MED ORDER — HEPARIN SOD (PORK) LOCK FLUSH 100 UNIT/ML IV SOLN
500.0000 [IU] | Freq: Once | INTRAVENOUS | Status: AC | PRN
  Administered 2019-06-22: 500 [IU]
  Filled 2019-06-22: qty 5

## 2019-06-22 MED ORDER — SODIUM CHLORIDE 0.9 % IV SOLN
132.8000 mg | Freq: Once | INTRAVENOUS | Status: AC
  Administered 2019-06-22: 130 mg via INTRAVENOUS
  Filled 2019-06-22: qty 13

## 2019-06-22 MED ORDER — FAMOTIDINE IN NACL 20-0.9 MG/50ML-% IV SOLN
INTRAVENOUS | Status: AC
  Filled 2019-06-22: qty 50

## 2019-06-22 MED ORDER — DIPHENHYDRAMINE HCL 50 MG/ML IJ SOLN
INTRAMUSCULAR | Status: AC
  Filled 2019-06-22: qty 1

## 2019-06-22 MED ORDER — SODIUM CHLORIDE 0.9 % IV SOLN
20.0000 mg | Freq: Once | INTRAVENOUS | Status: AC
  Administered 2019-06-22: 20 mg via INTRAVENOUS
  Filled 2019-06-22: qty 20

## 2019-06-22 MED ORDER — SODIUM CHLORIDE 0.9 % IV SOLN
Freq: Once | INTRAVENOUS | Status: AC
  Filled 2019-06-22: qty 250

## 2019-06-22 NOTE — Progress Notes (Signed)
Dr Maylon Peppers aware of bloodwork.  Ok to treat today.

## 2019-06-22 NOTE — Progress Notes (Signed)
[facility] OFFICE PROGRESS NOTE  Patient Care Team: Lawerance Cruel, MD as PCP - General (Family Medicine) Tish Men, MD as Medical Oncologist (Oncology) Cordelia Poche, RN as Oncology Nurse Navigator  HEME/ONC OVERVIEW: 1. Stage IIIA (cT4N0M0) squamous cell carcinoma of the right lower lung  -04/2019:   Large RLL lung mass ~7x5cm with ipsilateral hilar and paratracheal adenopathy; moderate emphysema   FDG-avid RLL mass 6.6cm with small hypermetabolic activity in the ipsilateral hilar and paratracheal LN's  EBUS w/ LN bx (separately by pulmonary medicine and thoracic surgery): squamous cell carcinoma (R lung), LN's all negative  Not a candidate for surgery due to poor PFT's  -05/2019 - present: definitive chemoradiation with weekly carboplatin/paclitaxel, w/ plan for maintenance durvalumab   TREATMENT SUMMARY:  06/01/2019- present: definitive chemoradiation with weekly carboplatin/Taxol, plan for 7 doses   ASSESSMENT & PLAN:   Stage IIIA (cT4N0M0) squamous cell carcinoma of the right lower lung  -S/p 3 cycles of weekly carboplatin/Taxol concurrent with radiation -Labs reviewed and adequate, proceed with Cycle 4 of weekly carboplatin/Taxol  -Plan for 7 doses -We will plan to repeat CT scan in 2-3 weeks after completing chemoradiation, and if stable or improving disease, then we will proceed with maintenance durvalumab  -PRN anti-emetics: Zofran, Compazine, dexamethasone   Chemotherapy-associated anemia -Likely multifactorial, including chemotherapy and anemia of chronic disease  -Hgb 10.4 today, stable -Patient denies any symptoms of bleeding -Continue treatment as outlined above  Chemotherapy-associated leukopenia -Secondary to chemotherapy -WBC 3.0k with ANC 2200, stable   -Patient denies any symptoms of infection -Continue treatment as outlined above  Radiation-induced esophagitis -Secondary to radiation  -Mild, currently drinking carbonated drinks  as needed with relief -I have prescribed Carafate QID PRN, if her symptoms worsen   Hyponatremia -Etiology unclear; possibly due to lung cancer  -Na 128 today, stable; patient denies any symptoms -If hyponatremia worsens or she develops related symptoms, then we will refer her to nephrology for further evaluation    All questions were answered. The patient knows to call the clinic with any problems, questions or concerns. No barriers to learning was detected.  Return in 1 week for Cycle 5 of chemotherapy, and 2 weeks for chemotherapy and clinic appt.   Tish Men, MD 5/11/202110:03 AM  CHIEF COMPLAINT: "I am doing fine so far"  INTERVAL HISTORY: Ms. Masser returns to clinic for follow-up of Stage III squamous cell carcinoma of the lung on definitive chemoradiation.  Patient reports that she has a history of acid reflux, for which she drinks soda with some relief.  Since starting chemoradiation, the frequency of acid reflux has slightly increased, but she is still able to manage it with drinking sodas periodically.  She has Pepcid, but does not take it regularly except in cases of allergic reaction.  She also has intermittent coughing, but she has not been taking Robitussin regularly.  She denies any fever, chest pain, dyspnea, or change in sputum production.  She denies any other complaint today.  REVIEW OF SYSTEMS:   Constitutional: ( - ) fevers, ( - )  chills , ( - ) night sweats Eyes: ( - ) blurriness of vision, ( - ) double vision, ( - ) watery eyes Ears, nose, mouth, throat, and face: ( - ) mucositis, ( - ) sore throat Respiratory: ( + ) cough, ( - ) dyspnea, ( - ) wheezes Cardiovascular: ( - ) palpitation, ( - ) chest discomfort, ( - ) lower extremity swelling Gastrointestinal:  ( - )  nausea, ( + ) heartburn, ( - ) change in bowel habits Skin: ( - ) abnormal skin rashes Lymphatics: ( - ) new lymphadenopathy, ( - ) easy bruising Neurological: ( - ) numbness, ( - ) tingling, ( - )  new weaknesses Behavioral/Psych: ( - ) mood change, ( - ) new changes  All other systems were reviewed with the patient and are negative.  SUMMARY OF ONCOLOGIC HISTORY: Oncology History  Squamous cell carcinoma lung ([place])  04/30/2019 Imaging   CT chest: IMPRESSION: 1. Heterogeneous right lower lobe pulmonary mass measuring 6.9 x 4.6 x 5.1 cm, highly concerning for primary bronchogenic malignancy. Margins are irregular with central hypodensity/necrosis. 2. Right hilar and lower paratracheal adenopathy, suspicious for nodal metastatic disease. 3. Moderate emphysema.   Aortic Atherosclerosis (ICD10-I70.0) and Emphysema (ICD10-J43.9).  ADDENDUM: The original report was by Dr. Keith Rake. The following addendum is by Dr. Van Clines:   On image 84/3, I measure the long-axis diameter of the right lower lobe tumor at 7.0 cm.   05/07/2019 Initial Diagnosis   Squamous cell carcinoma lung ([place])   05/10/2019 Imaging   PET: IMPRESSION: 1. The 6.6 cm right lower lobe mass has a maximum SUV of 18.7, compatible with malignancy. Small hypermetabolic subcarinal and right hilar/infrahilar nodes compatible with malignant involvement. Precarinal/lower paratracheal node measuring 0.8 cm in short axis has low-grade but above blood pool activity and could well represent early involvement. No findings of metastatic disease outside of the chest. 2. Aortic Atherosclerosis (ICD10-I70.0) and Emphysema (ICD10-J43.9).    05/11/2019 Pathology Results   FINAL MICROSCOPIC DIAGNOSIS:   A. LUNG, RIGHT LOWER LOBE, TRANSBRONCHIAL BIOPSY:  - Lung parenchyma with inflammation and mild cytologic atypia.  - See comment.   B. LYMPH NODE, 4R, FINE NEEDLE ASPIRATION: - No malignant cells identified.  C. LYMPH NODE, 7, FINE NEEDLE ASPIRATION: - No malignant cells identified.  D. LYMPH NODE, 11R, FINE NEEDLE ASPIRATION: - No malignant cells identified.  E. LUNG, RLL, FINE NEEDLE ASPIRATION: -  Malignant cells consistent with squamous cell carcinoma. - See comment.   05/19/2019 Imaging   MRI brain: IMPRESSION: No evidence of intracranial metastatic disease. Suboptimal evaluation for subtle lesion due to motion artifact.   05/21/2019 Pathology Results   FINAL MICROSCOPIC DIAGNOSIS:   A. LYMPH NODE, 7, BIOPSY:  - Benign lymph node.   A. LYMPH NODE, 7, FINE NEEDLE ASPIRATION:  - No malignant cells identified  - Lymphoid tissue present.   B. LYMPH NODE, 10 R, FINE NEEDLE ASPIRATION:  - No malignant cells identified  - Lymphoid tissue present.    05/25/2019 Cancer Staging   Staging form: Lung, AJCC 8th Edition - Clinical: Stage IIIA (cT4, cN0, cM0) - Signed by Tish Men, MD on 05/25/2019   06/01/2019 -  Chemotherapy   The patient had palonosetron (ALOXI) injection 0.25 mg, 0.25 mg, Intravenous,  Once, 4 of 7 cycles Administration: 0.25 mg (06/01/2019), 0.25 mg (06/08/2019), 0.25 mg (06/15/2019) CARBOplatin (PARAPLATIN) 130 mg in sodium chloride 0.9 % 100 mL chemo infusion, 130 mg (100 % of original dose 132.8 mg), Intravenous,  Once, 4 of 7 cycles Dose modification: 132.8 mg (original dose 132.8 mg, Cycle 1) Administration: 130 mg (06/01/2019), 130 mg (06/08/2019), 130 mg (06/15/2019) PACLitaxel (TAXOL) 66 mg in sodium chloride 0.9 % 150 mL chemo infusion (</= 80mg /m2), 45 mg/m2 = 66 mg, Intravenous,  Once, 4 of 7 cycles Administration: 66 mg (06/01/2019), 66 mg (06/08/2019), 66 mg (06/15/2019)  for chemotherapy treatment.  I have reviewed the past medical history, past surgical history, social history and family history with the patient and they are unchanged from previous note.  ALLERGIES:  is allergic to bee venom; timolol; brimonidine; and hydrocodone.  MEDICATIONS:  Current Outpatient Medications  Medication Sig Dispense Refill  . Cholecalciferol (VITAMIN D) 50 MCG (2000 UT) tablet Take 2,000 Units by mouth daily with supper.    . dexamethasone (DECADRON) 4 MG tablet Take 2  tablets (8 mg total) by mouth daily. Start the day after chemotherapy for 2 days. 30 tablet 1  . diphenhydrAMINE (BENADRYL) 25 MG tablet Take 50 mg by mouth daily as needed (allergic reaction).    . dorzolamide (TRUSOPT) 2 % ophthalmic solution Place 1 drop into both eyes 2 (two) times daily.     . famotidine (PEPCID) 10 MG tablet Take 10 mg by mouth daily as needed (take with benadryl when stung).    Marland Kitchen levothyroxine (SYNTHROID) 88 MCG tablet Take 1 tablet (88 mcg total) by mouth See admin instructions. Take 44 mcg on Mon and Fri, Take 88 mcg on Sun, Tues, Wed, Thurs, and Sat 30 tablet 1  . lidocaine-prilocaine (EMLA) cream Apply to affected area once 30 g 3  . LORazepam (ATIVAN) 0.5 MG tablet Take 1 tablet (0.5 mg total) by mouth every 6 (six) hours as needed (Nausea or vomiting). 30 tablet 0  . ondansetron (ZOFRAN) 8 MG tablet Take 1 tablet (8 mg total) by mouth 2 (two) times daily as needed for refractory nausea / vomiting. Start on day 3 after chemo. 30 tablet 1  . prochlorperazine (COMPAZINE) 10 MG tablet Take 1 tablet (10 mg total) by mouth every 6 (six) hours as needed (Nausea or vomiting). 30 tablet 1  . sucralfate (CARAFATE) 1 g tablet Take 1 tablet (1 g total) by mouth 4 (four) times daily -  with meals and at bedtime. As needed 60 tablet 2  . travoprost, benzalkonium, (TRAVATAN) 0.004 % ophthalmic solution Place 1 drop into both eyes at bedtime.       No current facility-administered medications for this visit.   Facility-Administered Medications Ordered in Other Visits  Medication Dose Route Frequency Provider Last Rate Last Admin  . CARBOplatin (PARAPLATIN) 130 mg in sodium chloride 0.9 % 100 mL chemo infusion  130 mg Intravenous Once Tish Men, MD      . dexamethasone (DECADRON) 20 mg in sodium chloride 0.9 % 50 mL IVPB  20 mg Intravenous Once Tish Men, MD      . famotidine (PEPCID) IVPB 20 mg premix  20 mg Intravenous Once Tish Men, MD 200 mL/hr at 06/22/19 1001 20 mg at 06/22/19  1001  . heparin lock flush 100 unit/mL  500 Units Intracatheter Once PRN Tish Men, MD      . PACLitaxel (TAXOL) 66 mg in sodium chloride 0.9 % 150 mL chemo infusion (</= 80mg /m2)  45 mg/m2 (Treatment Plan Recorded) Intravenous Once Tish Men, MD      . sodium chloride flush (NS) 0.9 % injection 10 mL  10 mL Intracatheter PRN Tish Men, MD        PHYSICAL EXAMINATION: ECOG PERFORMANCE STATUS: 1 - Symptomatic but completely ambulatory  Today's Vitals   06/22/19 0928  Pulse: 88  Resp: 18  Temp: 97.7 F (36.5 C)  TempSrc: Temporal  Weight: 113 lb (51.3 kg)  PainSc: 0-No pain   Body mass index is 22.44 kg/m.  Filed Weights   06/22/19 0928  Weight: 113 lb (51.3 kg)  GENERAL: alert, no distress and comfortable SKIN: skin color, texture, turgor are normal, no rashes or significant lesions EYES: conjunctiva are pink and non-injected, sclera clear OROPHARYNX: no exudate, no erythema; lips, buccal mucosa, and tongue normal  NECK: supple, non-tender LUNGS: clear to auscultation with normal breathing effort HEART: regular rate & rhythm and no murmurs and no lower extremity edema ABDOMEN: soft, non-tender, non-distended, normal bowel sounds Musculoskeletal: no cyanosis of digits and no clubbing  PSYCH: alert & oriented x 3, fluent speech  LABORATORY DATA:  I have reviewed the data as listed    Component Value Date/Time   NA 128 (L) 06/22/2019 0913   K 3.7 06/22/2019 0913   CL 96 (L) 06/22/2019 0913   CO2 24 06/22/2019 0913   GLUCOSE 157 (H) 06/22/2019 0913   BUN 15 06/22/2019 0913   CREATININE 0.47 06/22/2019 0913   CALCIUM 8.7 (L) 06/22/2019 0913   PROT 6.1 (L) 06/22/2019 0913   ALBUMIN 3.7 06/22/2019 0913   AST 9 (L) 06/22/2019 0913   ALT 21 06/22/2019 0913   ALKPHOS 108 06/22/2019 0913   BILITOT 0.3 06/22/2019 0913   GFRNONAA >60 06/22/2019 0913   GFRAA >60 06/22/2019 0913    No results found for: SPEP, UPEP  Lab Results  Component Value Date   WBC 3.0 (L)  06/22/2019   NEUTROABS 2.2 06/22/2019   HGB 10.4 (L) 06/22/2019   HCT 32.3 (L) 06/22/2019   MCV 86.6 06/22/2019   PLT 220 06/22/2019      Chemistry      Component Value Date/Time   NA 128 (L) 06/22/2019 0913   K 3.7 06/22/2019 0913   CL 96 (L) 06/22/2019 0913   CO2 24 06/22/2019 0913   BUN 15 06/22/2019 0913   CREATININE 0.47 06/22/2019 0913      Component Value Date/Time   CALCIUM 8.7 (L) 06/22/2019 0913   ALKPHOS 108 06/22/2019 0913   AST 9 (L) 06/22/2019 0913   ALT 21 06/22/2019 0913   BILITOT 0.3 06/22/2019 0913       RADIOGRAPHIC STUDIES: I have personally reviewed the radiological images as listed below and agreed with the findings in the report. IR IMAGING GUIDED PORT INSERTION  Result Date: 05/27/2019 CLINICAL DATA:  SQUAMOUS CELL LUNG CARCINOMA, ACCESS FOR CHEMOTHERAPY EXAM: RIGHT INTERNAL JUGULAR SINGLE LUMEN POWER PORT CATHETER INSERTION Date:  05/27/2019 05/27/2019 2:22 pm Radiologist:  M. Daryll Brod, MD Guidance:  Ultrasound and fluoroscopic MEDICATIONS: Ancef 2 g; The antibiotic was administered within an appropriate time interval prior to skin puncture. ANESTHESIA/SEDATION: Versed 2.0 mg IV; Fentanyl 100 mcg IV; Moderate Sedation Time:  23 minutes The patient was continuously monitored during the procedure by the interventional radiology nurse under my direct supervision. FLUOROSCOPY TIME:  0 minutes, 18 seconds (1.0 mGy) COMPLICATIONS: None immediate. CONTRAST:  None. PROCEDURE: Informed consent was obtained from the patient following explanation of the procedure, risks, benefits and alternatives. The patient understands, agrees and consents for the procedure. All questions were addressed. A time out was performed. Maximal barrier sterile technique utilized including caps, mask, sterile gowns, sterile gloves, large sterile drape, hand hygiene, and 2% chlorhexidine scrub. Under sterile conditions and local anesthesia, right internal jugular micropuncture venous access  was performed. Access was performed with ultrasound. Images were obtained for documentation of the patent right internal jugular vein. A guide wire was inserted followed by a transitional dilator. This allowed insertion of a guide wire and catheter into the IVC. Measurements were obtained from the SVC /  RA junction back to the right IJ venotomy site. In the right infraclavicular chest, a subcutaneous pocket was created over the second anterior rib. This was done under sterile conditions and local anesthesia. 1% lidocaine with epinephrine was utilized for this. A 2.5 cm incision was made in the skin. Blunt dissection was performed to create a subcutaneous pocket over the right pectoralis major muscle. The pocket was flushed with saline vigorously. There was adequate hemostasis. The port catheter was assembled and checked for leakage. The port catheter was secured in the pocket with two retention sutures. The tubing was tunneled subcutaneously to the right venotomy site and inserted into the SVC/RA junction through a valved peel-away sheath. Position was confirmed with fluoroscopy. Images were obtained for documentation. The patient tolerated the procedure well. No immediate complications. Incisions were closed in a two layer fashion with 4 - 0 Vicryl suture. Dermabond was applied to the skin. The port catheter was accessed, blood was aspirated followed by saline and heparin flushes. Needle was removed. A dry sterile dressing was applied. IMPRESSION: Ultrasound and fluoroscopically guided right internal jugular single lumen power port catheter insertion. Tip in the SVC/RA junction. Catheter ready for use. Electronically Signed   By: Jerilynn Mages.  Shick M.D.   On: 05/27/2019 14:39

## 2019-06-22 NOTE — Patient Instructions (Signed)
Implanted Port Insertion, Care After °This sheet gives you information about how to care for yourself after your procedure. Your health care provider may also give you more specific instructions. If you have problems or questions, contact your health care provider. °What can I expect after the procedure? °After the procedure, it is common to have: °· Discomfort at the port insertion site. °· Bruising on the skin over the port. This should improve over 3-4 days. °Follow these instructions at home: °Port care °· After your port is placed, you will get a manufacturer's information card. The card has information about your port. Keep this card with you at all times. °· Take care of the port as told by your health care provider. Ask your health care provider if you or a family member can get training for taking care of the port at home. A home health care nurse may also take care of the port. °· Make sure to remember what type of port you have. °Incision care ° °  ° °· Follow instructions from your health care provider about how to take care of your port insertion site. Make sure you: °? Wash your hands with soap and water before and after you change your bandage (dressing). If soap and water are not available, use hand sanitizer. °? Change your dressing as told by your health care provider. °? Leave stitches (sutures), skin glue, or adhesive strips in place. These skin closures may need to stay in place for 2 weeks or longer. If adhesive strip edges start to loosen and curl up, you may trim the loose edges. Do not remove adhesive strips completely unless your health care provider tells you to do that. °· Check your port insertion site every day for signs of infection. Check for: °? Redness, swelling, or pain. °? Fluid or blood. °? Warmth. °? Pus or a bad smell. °Activity °· Return to your normal activities as told by your health care provider. Ask your health care provider what activities are safe for you. °· Do not  lift anything that is heavier than 10 lb (4.5 kg), or the limit that you are told, until your health care provider says that it is safe. °General instructions °· Take over-the-counter and prescription medicines only as told by your health care provider. °· Do not take baths, swim, or use a hot tub until your health care provider approves. Ask your health care provider if you may take showers. You may only be allowed to take sponge baths. °· Do not drive for 24 hours if you were given a sedative during your procedure. °· Wear a medical alert bracelet in case of an emergency. This will tell any health care providers that you have a port. °· Keep all follow-up visits as told by your health care provider. This is important. °Contact a health care provider if: °· You cannot flush your port with saline as directed, or you cannot draw blood from the port. °· You have a fever or chills. °· You have redness, swelling, or pain around your port insertion site. °· You have fluid or blood coming from your port insertion site. °· Your port insertion site feels warm to the touch. °· You have pus or a bad smell coming from the port insertion site. °Get help right away if: °· You have chest pain or shortness of breath. °· You have bleeding from your port that you cannot control. °Summary °· Take care of the port as told by your health   care provider. Keep the manufacturer's information card with you at all times. °· Change your dressing as told by your health care provider. °· Contact a health care provider if you have a fever or chills or if you have redness, swelling, or pain around your port insertion site. °· Keep all follow-up visits as told by your health care provider. °This information is not intended to replace advice given to you by your health care provider. Make sure you discuss any questions you have with your health care provider. °Document Revised: 08/26/2017 Document Reviewed: 08/26/2017 °Elsevier Patient Education ©  2020 [employer] ° °

## 2019-06-22 NOTE — Progress Notes (Signed)
Pharmacist Chemotherapy Monitoring - Follow Up Assessment    I verify that I have reviewed each item in the below checklist:  . Regimen for the patient is scheduled for the appropriate day and plan matches scheduled date. Marland Kitchen Appropriate non-routine labs are ordered dependent on drug ordered. . If applicable, additional medications reviewed and ordered per protocol based on lifetime cumulative doses and/or treatment regimen.   Plan for follow-up and/or issues identified: No . I-vent associated with next due treatment: No . MD and/or nursing notified: No  Shawntay Prest, Jacqlyn Larsen 06/22/2019 8:19 AM

## 2019-06-22 NOTE — Patient Instructions (Signed)
[facility] Discharge Instructions for Patients Receiving Chemotherapy  Today you received the following chemotherapy agents Taxol, Carbo  To help prevent nausea and vomiting after your treatment, we encourage you to take your nausea medication    If you develop nausea and vomiting that is not controlled by your nausea medication, call the clinic.   BELOW ARE SYMPTOMS THAT SHOULD BE REPORTED IMMEDIATELY:  *FEVER GREATER THAN 100.5 F  *CHILLS WITH OR WITHOUT FEVER  NAUSEA AND VOMITING THAT IS NOT CONTROLLED WITH YOUR NAUSEA MEDICATION  *UNUSUAL SHORTNESS OF BREATH  *UNUSUAL BRUISING OR BLEEDING  TENDERNESS IN MOUTH AND THROAT WITH OR WITHOUT PRESENCE OF ULCERS  *URINARY PROBLEMS  *BOWEL PROBLEMS  UNUSUAL RASH Items with * indicate a potential emergency and should be followed up as soon as possible.  Feel free to call the clinic should you have any questions or concerns. The clinic phone number is [phone].  Please show the CHEMO ALERT CARD at check-in to the Emergency Department and triage nurse.   

## 2019-06-23 ENCOUNTER — Other Ambulatory Visit: Payer: Self-pay

## 2019-06-23 ENCOUNTER — Ambulatory Visit
Admission: RE | Admit: 2019-06-23 | Discharge: 2019-06-23 | Disposition: A | Discharge status: Home / Self Care | Payer: Medicare Other | Source: Ambulatory Visit | Attending: Radiation Oncology | Admitting: Radiation Oncology

## 2019-06-23 DIAGNOSIS — C3431 Malignant neoplasm of lower lobe, right bronchus or lung: Secondary | ICD-10-CM | POA: Diagnosis not present

## 2019-06-23 DIAGNOSIS — Z510 Encounter for antineoplastic radiation therapy: Secondary | ICD-10-CM | POA: Diagnosis not present

## 2019-06-24 ENCOUNTER — Other Ambulatory Visit: Payer: Self-pay

## 2019-06-24 ENCOUNTER — Ambulatory Visit
Admission: RE | Admit: 2019-06-24 | Discharge: 2019-06-24 | Disposition: A | Discharge status: Home / Self Care | Payer: Medicare Other | Source: Ambulatory Visit | Attending: Radiation Oncology | Admitting: Radiation Oncology

## 2019-06-24 DIAGNOSIS — Z510 Encounter for antineoplastic radiation therapy: Secondary | ICD-10-CM | POA: Diagnosis not present

## 2019-06-24 DIAGNOSIS — C3431 Malignant neoplasm of lower lobe, right bronchus or lung: Secondary | ICD-10-CM | POA: Diagnosis not present

## 2019-06-25 ENCOUNTER — Ambulatory Visit
Admission: RE | Admit: 2019-06-25 | Discharge: 2019-06-25 | Disposition: A | Discharge status: Home / Self Care | Payer: Medicare Other | Source: Ambulatory Visit | Attending: Radiation Oncology | Admitting: Radiation Oncology

## 2019-06-25 ENCOUNTER — Other Ambulatory Visit: Payer: Self-pay

## 2019-06-25 DIAGNOSIS — C3431 Malignant neoplasm of lower lobe, right bronchus or lung: Secondary | ICD-10-CM | POA: Diagnosis not present

## 2019-06-25 DIAGNOSIS — Z510 Encounter for antineoplastic radiation therapy: Secondary | ICD-10-CM | POA: Diagnosis not present

## 2019-06-28 ENCOUNTER — Other Ambulatory Visit: Payer: Self-pay

## 2019-06-28 ENCOUNTER — Ambulatory Visit
Admission: RE | Admit: 2019-06-28 | Discharge: 2019-06-28 | Disposition: A | Discharge status: Home / Self Care | Payer: Medicare Other | Source: Ambulatory Visit | Attending: Radiation Oncology | Admitting: Radiation Oncology

## 2019-06-28 DIAGNOSIS — Z510 Encounter for antineoplastic radiation therapy: Secondary | ICD-10-CM | POA: Diagnosis not present

## 2019-06-28 DIAGNOSIS — C3431 Malignant neoplasm of lower lobe, right bronchus or lung: Secondary | ICD-10-CM | POA: Diagnosis not present

## 2019-06-29 ENCOUNTER — Ambulatory Visit: Payer: Medicare Other

## 2019-06-29 ENCOUNTER — Ambulatory Visit
Admission: RE | Admit: 2019-06-29 | Discharge: 2019-06-29 | Disposition: A | Discharge status: Home / Self Care | Payer: Medicare Other | Source: Ambulatory Visit | Attending: Radiation Oncology | Admitting: Radiation Oncology

## 2019-06-29 ENCOUNTER — Inpatient Hospital Stay: Payer: Medicare Other

## 2019-06-29 ENCOUNTER — Other Ambulatory Visit: Payer: Self-pay

## 2019-06-29 VITALS — BP 101/68 | HR 87 | Temp 97.1°F | Resp 17

## 2019-06-29 DIAGNOSIS — Z5111 Encounter for antineoplastic chemotherapy: Secondary | ICD-10-CM | POA: Diagnosis not present

## 2019-06-29 DIAGNOSIS — C3491 Malignant neoplasm of unspecified part of right bronchus or lung: Secondary | ICD-10-CM

## 2019-06-29 DIAGNOSIS — C3431 Malignant neoplasm of lower lobe, right bronchus or lung: Secondary | ICD-10-CM | POA: Diagnosis not present

## 2019-06-29 DIAGNOSIS — Z510 Encounter for antineoplastic radiation therapy: Secondary | ICD-10-CM | POA: Diagnosis not present

## 2019-06-29 DIAGNOSIS — D701 Agranulocytosis secondary to cancer chemotherapy: Secondary | ICD-10-CM | POA: Diagnosis not present

## 2019-06-29 LAB — CMP (CANCER CENTER ONLY)
ALT: 17 U/L (ref 0–44)
AST: 8 U/L — ABNORMAL LOW (ref 15–41)
Albumin: 3.8 g/dL (ref 3.5–5.0)
Alkaline Phosphatase: 113 U/L (ref 38–126)
Anion gap: 7 (ref 5–15)
BUN: 13 mg/dL (ref 8–23)
CO2: 27 mmol/L (ref 22–32)
Calcium: 9 mg/dL (ref 8.9–10.3)
Chloride: 96 mmol/L — ABNORMAL LOW (ref 98–111)
Creatinine: 0.42 mg/dL — ABNORMAL LOW (ref 0.44–1.00)
GFR, Est AFR Am: >60 mL/min (ref >60)
GFR, Estimated: >60 mL/min (ref >60)
Glucose, Bld: 93 mg/dL (ref 70–99)
Potassium: 3.8 mmol/L (ref 3.5–5.1)
Sodium: 130 mmol/L — ABNORMAL LOW (ref 135–145)
Total Bilirubin: 0.4 mg/dL (ref 0.3–1.2)
Total Protein: 6 g/dL — ABNORMAL LOW (ref 6.5–8.1)

## 2019-06-29 LAB — CBC WITH DIFFERENTIAL (CANCER CENTER ONLY)
Abs Immature Granulocytes: 0.05 10*3/uL (ref 0.00–0.07)
Basophils Absolute: 0 10*3/uL (ref 0.0–0.1)
Basophils Relative: 0 %
Eosinophils Absolute: 0.1 10*3/uL (ref 0.0–0.5)
Eosinophils Relative: 2 %
HCT: 32.9 % — ABNORMAL LOW (ref 36.0–46.0)
Hemoglobin: 10.7 g/dL — ABNORMAL LOW (ref 12.0–15.0)
Immature Granulocytes: 2 %
Lymphocytes Relative: 13 %
Lymphs Abs: 0.3 10*3/uL — ABNORMAL LOW (ref 0.7–4.0)
MCH: 27.9 pg (ref 26.0–34.0)
MCHC: 32.5 g/dL (ref 30.0–36.0)
MCV: 85.7 fL (ref 80.0–100.0)
Monocytes Absolute: 0.4 10*3/uL (ref 0.1–1.0)
Monocytes Relative: 17 %
Neutro Abs: 1.7 10*3/uL (ref 1.7–7.7)
Neutrophils Relative %: 66 %
Platelet Count: 224 10*3/uL (ref 150–400)
RBC: 3.84 MIL/uL — ABNORMAL LOW (ref 3.87–5.11)
RDW: 16.3 % — ABNORMAL HIGH (ref 11.5–15.5)
WBC Count: 2.5 10*3/uL — ABNORMAL LOW (ref 4.0–10.5)
nRBC: 0 % (ref 0.0–0.2)

## 2019-06-29 MED ORDER — SODIUM CHLORIDE 0.9 % IV SOLN
130.0000 mg | Freq: Once | INTRAVENOUS | Status: AC
  Administered 2019-06-29: 130 mg via INTRAVENOUS
  Filled 2019-06-29: qty 13

## 2019-06-29 MED ORDER — DIPHENHYDRAMINE HCL 50 MG/ML IJ SOLN
INTRAMUSCULAR | Status: AC
  Filled 2019-06-29: qty 1

## 2019-06-29 MED ORDER — FAMOTIDINE IN NACL 20-0.9 MG/50ML-% IV SOLN
INTRAVENOUS | Status: AC
  Filled 2019-06-29: qty 50

## 2019-06-29 MED ORDER — HEPARIN SOD (PORK) LOCK FLUSH 100 UNIT/ML IV SOLN
500.0000 [IU] | Freq: Once | INTRAVENOUS | Status: AC | PRN
  Administered 2019-06-29: 500 [IU]
  Filled 2019-06-29: qty 5

## 2019-06-29 MED ORDER — SODIUM CHLORIDE 0.9% FLUSH
10.0000 mL | INTRAVENOUS | Status: DC | PRN
  Administered 2019-06-29: 10 mL
  Filled 2019-06-29: qty 10

## 2019-06-29 MED ORDER — DIPHENHYDRAMINE HCL 50 MG/ML IJ SOLN
50.0000 mg | Freq: Once | INTRAMUSCULAR | Status: AC
  Administered 2019-06-29: 50 mg via INTRAVENOUS

## 2019-06-29 MED ORDER — FAMOTIDINE IN NACL 20-0.9 MG/50ML-% IV SOLN
20.0000 mg | Freq: Once | INTRAVENOUS | Status: AC
  Administered 2019-06-29: 20 mg via INTRAVENOUS

## 2019-06-29 MED ORDER — SODIUM CHLORIDE 0.9 % IV SOLN
45.0000 mg/m2 | Freq: Once | INTRAVENOUS | Status: AC
  Administered 2019-06-29: 66 mg via INTRAVENOUS
  Filled 2019-06-29: qty 11

## 2019-06-29 MED ORDER — SODIUM CHLORIDE 0.9 % IV SOLN
20.0000 mg | Freq: Once | INTRAVENOUS | Status: AC
  Administered 2019-06-29: 20 mg via INTRAVENOUS
  Filled 2019-06-29: qty 20

## 2019-06-29 MED ORDER — PALONOSETRON HCL INJECTION 0.25 MG/5ML
0.2500 mg | Freq: Once | INTRAVENOUS | Status: AC
  Administered 2019-06-29: 0.25 mg via INTRAVENOUS

## 2019-06-29 MED ORDER — SODIUM CHLORIDE 0.9 % IV SOLN
Freq: Once | INTRAVENOUS | Status: AC
  Filled 2019-06-29: qty 250

## 2019-06-29 MED ORDER — PALONOSETRON HCL INJECTION 0.25 MG/5ML
INTRAVENOUS | Status: AC
  Filled 2019-06-29: qty 5

## 2019-06-29 NOTE — Patient Instructions (Signed)
[facility] Discharge Instructions for Patients Receiving Chemotherapy  Today you received the following chemotherapy agents Taxol, Carboplatin  To help prevent nausea and vomiting after your treatment, we encourage you to take your nausea medication    If you develop nausea and vomiting that is not controlled by your nausea medication, call the clinic.   BELOW ARE SYMPTOMS THAT SHOULD BE REPORTED IMMEDIATELY:  *FEVER GREATER THAN 100.5 F  *CHILLS WITH OR WITHOUT FEVER  NAUSEA AND VOMITING THAT IS NOT CONTROLLED WITH YOUR NAUSEA MEDICATION  *UNUSUAL SHORTNESS OF BREATH  *UNUSUAL BRUISING OR BLEEDING  TENDERNESS IN MOUTH AND THROAT WITH OR WITHOUT PRESENCE OF ULCERS  *URINARY PROBLEMS  *BOWEL PROBLEMS  UNUSUAL RASH Items with * indicate a potential emergency and should be followed up as soon as possible.  Feel free to call the clinic should you have any questions or concerns. The clinic phone number is [phone].  Please show the [place] at check-in to the Emergency Department and triage nurse.

## 2019-06-29 NOTE — Progress Notes (Signed)
Pharmacist Chemotherapy Monitoring - Follow Up Assessment    I verify that I have reviewed each item in the below checklist:  . Regimen for the patient is scheduled for the appropriate day and plan matches scheduled date. Marland Kitchen Appropriate non-routine labs are ordered dependent on drug ordered. . If applicable, additional medications reviewed and ordered per protocol based on lifetime cumulative doses and/or treatment regimen.   Plan for follow-up and/or issues identified: No . I-vent associated with next due treatment: No . MD and/or nursing notified: No  Acquanetta Belling 06/29/2019 4:06 PM

## 2019-06-30 ENCOUNTER — Ambulatory Visit: Payer: Medicare Other

## 2019-06-30 ENCOUNTER — Other Ambulatory Visit: Payer: Self-pay

## 2019-06-30 ENCOUNTER — Ambulatory Visit
Admission: RE | Admit: 2019-06-30 | Discharge: 2019-06-30 | Disposition: A | Discharge status: Home / Self Care | Payer: Medicare Other | Source: Ambulatory Visit | Attending: Radiation Oncology | Admitting: Radiation Oncology

## 2019-06-30 DIAGNOSIS — C3431 Malignant neoplasm of lower lobe, right bronchus or lung: Secondary | ICD-10-CM | POA: Diagnosis not present

## 2019-06-30 DIAGNOSIS — Z510 Encounter for antineoplastic radiation therapy: Secondary | ICD-10-CM | POA: Diagnosis not present

## 2019-07-01 ENCOUNTER — Other Ambulatory Visit: Payer: Self-pay

## 2019-07-01 ENCOUNTER — Ambulatory Visit: Payer: Medicare Other

## 2019-07-01 ENCOUNTER — Ambulatory Visit
Admission: RE | Admit: 2019-07-01 | Discharge: 2019-07-01 | Disposition: A | Discharge status: Home / Self Care | Payer: Medicare Other | Source: Ambulatory Visit | Attending: Radiation Oncology | Admitting: Radiation Oncology

## 2019-07-01 DIAGNOSIS — C3431 Malignant neoplasm of lower lobe, right bronchus or lung: Secondary | ICD-10-CM | POA: Diagnosis not present

## 2019-07-01 DIAGNOSIS — Z510 Encounter for antineoplastic radiation therapy: Secondary | ICD-10-CM | POA: Diagnosis not present

## 2019-07-02 ENCOUNTER — Ambulatory Visit
Admission: RE | Admit: 2019-07-02 | Discharge: 2019-07-02 | Disposition: A | Discharge status: Home / Self Care | Payer: Medicare Other | Source: Ambulatory Visit | Attending: Radiation Oncology | Admitting: Radiation Oncology

## 2019-07-02 ENCOUNTER — Other Ambulatory Visit: Payer: Self-pay

## 2019-07-02 ENCOUNTER — Ambulatory Visit: Payer: Medicare Other

## 2019-07-02 DIAGNOSIS — C3431 Malignant neoplasm of lower lobe, right bronchus or lung: Secondary | ICD-10-CM | POA: Diagnosis not present

## 2019-07-02 DIAGNOSIS — Z510 Encounter for antineoplastic radiation therapy: Secondary | ICD-10-CM | POA: Diagnosis not present

## 2019-07-04 ENCOUNTER — Ambulatory Visit: Payer: Medicare Other

## 2019-07-05 ENCOUNTER — Ambulatory Visit
Admission: RE | Admit: 2019-07-05 | Discharge: 2019-07-05 | Disposition: A | Discharge status: Home / Self Care | Payer: Medicare Other | Source: Ambulatory Visit | Attending: Radiation Oncology | Admitting: Radiation Oncology

## 2019-07-05 ENCOUNTER — Other Ambulatory Visit: Payer: Self-pay

## 2019-07-05 DIAGNOSIS — Z510 Encounter for antineoplastic radiation therapy: Secondary | ICD-10-CM | POA: Diagnosis not present

## 2019-07-05 DIAGNOSIS — C3431 Malignant neoplasm of lower lobe, right bronchus or lung: Secondary | ICD-10-CM | POA: Diagnosis not present

## 2019-07-05 NOTE — Progress Notes (Signed)
Pharmacist Chemotherapy Monitoring - Follow Up Assessment    I verify that I have reviewed each item in the below checklist:  . Regimen for the patient is scheduled for the appropriate day and plan matches scheduled date. Marland Kitchen Appropriate non-routine labs are ordered dependent on drug ordered. . If applicable, additional medications reviewed and ordered per protocol based on lifetime cumulative doses and/or treatment regimen.   Plan for follow-up and/or issues identified: No . I-vent associated with next due treatment: No . MD and/or nursing notified: No  [name], Jacqlyn Larsen 07/05/2019 12:44 PM

## 2019-07-06 ENCOUNTER — Inpatient Hospital Stay (HOSPITAL_BASED_OUTPATIENT_CLINIC_OR_DEPARTMENT_OTHER): Payer: Medicare Other | Admitting: Hematology & Oncology

## 2019-07-06 ENCOUNTER — Inpatient Hospital Stay: Payer: Medicare Other

## 2019-07-06 ENCOUNTER — Encounter: Payer: Self-pay | Admitting: *Deleted

## 2019-07-06 ENCOUNTER — Ambulatory Visit
Admission: RE | Admit: 2019-07-06 | Discharge: 2019-07-06 | Disposition: A | Discharge status: Home / Self Care | Payer: Medicare Other | Source: Ambulatory Visit | Attending: Radiation Oncology | Admitting: Radiation Oncology

## 2019-07-06 ENCOUNTER — Other Ambulatory Visit: Payer: Self-pay

## 2019-07-06 ENCOUNTER — Encounter: Payer: Self-pay | Admitting: Hematology & Oncology

## 2019-07-06 VITALS — Wt 112.2 lb

## 2019-07-06 DIAGNOSIS — C3491 Malignant neoplasm of unspecified part of right bronchus or lung: Secondary | ICD-10-CM

## 2019-07-06 DIAGNOSIS — C3490 Malignant neoplasm of unspecified part of unspecified bronchus or lung: Secondary | ICD-10-CM

## 2019-07-06 DIAGNOSIS — Z5111 Encounter for antineoplastic chemotherapy: Secondary | ICD-10-CM | POA: Diagnosis not present

## 2019-07-06 DIAGNOSIS — Z510 Encounter for antineoplastic radiation therapy: Secondary | ICD-10-CM | POA: Diagnosis not present

## 2019-07-06 DIAGNOSIS — C3431 Malignant neoplasm of lower lobe, right bronchus or lung: Secondary | ICD-10-CM | POA: Diagnosis not present

## 2019-07-06 DIAGNOSIS — D701 Agranulocytosis secondary to cancer chemotherapy: Secondary | ICD-10-CM | POA: Diagnosis not present

## 2019-07-06 LAB — CMP (CANCER CENTER ONLY)
ALT: 18 U/L (ref 0–44)
AST: 11 U/L — ABNORMAL LOW (ref 15–41)
Albumin: 3.9 g/dL (ref 3.5–5.0)
Alkaline Phosphatase: 101 U/L (ref 38–126)
Anion gap: 7 (ref 5–15)
BUN: 10 mg/dL (ref 8–23)
CO2: 26 mmol/L (ref 22–32)
Calcium: 9.1 mg/dL (ref 8.9–10.3)
Chloride: 97 mmol/L — ABNORMAL LOW (ref 98–111)
Creatinine: 0.44 mg/dL (ref 0.44–1.00)
GFR, Est AFR Am: >60 mL/min
GFR, Estimated: >60 mL/min
Glucose, Bld: 93 mg/dL (ref 70–99)
Potassium: 4 mmol/L (ref 3.5–5.1)
Sodium: 130 mmol/L — ABNORMAL LOW (ref 135–145)
Total Bilirubin: 0.4 mg/dL (ref 0.3–1.2)
Total Protein: 6.6 g/dL (ref 6.5–8.1)

## 2019-07-06 LAB — CBC WITH DIFFERENTIAL (CANCER CENTER ONLY)
Abs Immature Granulocytes: 0.04 K/uL (ref 0.00–0.07)
Basophils Absolute: 0 K/uL (ref 0.0–0.1)
Basophils Relative: 0 %
Eosinophils Absolute: 0.1 K/uL (ref 0.0–0.5)
Eosinophils Relative: 2 %
HCT: 34.1 % — ABNORMAL LOW (ref 36.0–46.0)
Hemoglobin: 11.1 g/dL — ABNORMAL LOW (ref 12.0–15.0)
Immature Granulocytes: 2 %
Lymphocytes Relative: 20 %
Lymphs Abs: 0.5 K/uL — ABNORMAL LOW (ref 0.7–4.0)
MCH: 28.4 pg (ref 26.0–34.0)
MCHC: 32.6 g/dL (ref 30.0–36.0)
MCV: 87.2 fL (ref 80.0–100.0)
Monocytes Absolute: 0.4 K/uL (ref 0.1–1.0)
Monocytes Relative: 14 %
Neutro Abs: 1.7 K/uL (ref 1.7–7.7)
Neutrophils Relative %: 62 %
Platelet Count: 204 K/uL (ref 150–400)
RBC: 3.91 MIL/uL (ref 3.87–5.11)
RDW: 16.9 % — ABNORMAL HIGH (ref 11.5–15.5)
WBC Count: 2.7 K/uL — ABNORMAL LOW (ref 4.0–10.5)
nRBC: 0 % (ref 0.0–0.2)

## 2019-07-06 MED ORDER — DIPHENHYDRAMINE HCL 50 MG/ML IJ SOLN
INTRAMUSCULAR | Status: AC
  Filled 2019-07-06: qty 1

## 2019-07-06 MED ORDER — SODIUM CHLORIDE 0.9 % IV SOLN
Freq: Once | INTRAVENOUS | Status: AC
  Filled 2019-07-06: qty 250

## 2019-07-06 MED ORDER — COLD PACK MISC ONCOLOGY
1.0000 | Freq: Once | Status: DC | PRN
  Filled 2019-07-06: qty 1

## 2019-07-06 MED ORDER — FAMOTIDINE IN NACL 20-0.9 MG/50ML-% IV SOLN
INTRAVENOUS | Status: AC
  Filled 2019-07-06: qty 50

## 2019-07-06 MED ORDER — FAMOTIDINE IN NACL 20-0.9 MG/50ML-% IV SOLN
20.0000 mg | Freq: Once | INTRAVENOUS | Status: AC
  Administered 2019-07-06: 20 mg via INTRAVENOUS

## 2019-07-06 MED ORDER — SODIUM CHLORIDE 0.9 % IV SOLN
132.8000 mg | Freq: Once | INTRAVENOUS | Status: AC
  Administered 2019-07-06: 130 mg via INTRAVENOUS
  Filled 2019-07-06: qty 13

## 2019-07-06 MED ORDER — SODIUM CHLORIDE 0.9% FLUSH
10.0000 mL | INTRAVENOUS | Status: DC | PRN
  Administered 2019-07-06: 10 mL
  Filled 2019-07-06: qty 10

## 2019-07-06 MED ORDER — PALONOSETRON HCL INJECTION 0.25 MG/5ML
INTRAVENOUS | Status: AC
  Filled 2019-07-06: qty 5

## 2019-07-06 MED ORDER — SODIUM CHLORIDE 0.9 % IV SOLN
20.0000 mg | Freq: Once | INTRAVENOUS | Status: AC
  Administered 2019-07-06: 20 mg via INTRAVENOUS
  Filled 2019-07-06: qty 20

## 2019-07-06 MED ORDER — PALONOSETRON HCL INJECTION 0.25 MG/5ML
0.2500 mg | Freq: Once | INTRAVENOUS | Status: AC
  Administered 2019-07-06: 0.25 mg via INTRAVENOUS

## 2019-07-06 MED ORDER — HEPARIN SOD (PORK) LOCK FLUSH 100 UNIT/ML IV SOLN
500.0000 [IU] | Freq: Once | INTRAVENOUS | Status: AC | PRN
  Administered 2019-07-06: 500 [IU]
  Filled 2019-07-06: qty 5

## 2019-07-06 MED ORDER — SODIUM CHLORIDE 0.9 % IV SOLN
45.0000 mg/m2 | Freq: Once | INTRAVENOUS | Status: AC
  Administered 2019-07-06: 66 mg via INTRAVENOUS
  Filled 2019-07-06: qty 11

## 2019-07-06 MED ORDER — DIPHENHYDRAMINE HCL 50 MG/ML IJ SOLN
50.0000 mg | Freq: Once | INTRAMUSCULAR | Status: AC
  Administered 2019-07-06: 50 mg via INTRAVENOUS

## 2019-07-06 NOTE — Patient Instructions (Signed)

## 2019-07-06 NOTE — Progress Notes (Signed)
Hematology and Oncology Follow Up Visit  [name] [id number] November 08, 1945 74 y.o. 07/06/2019   Principle Diagnosis:   Stage IIIA (T4N0M0) squamous cell carcinoma the right lower lung  Current Therapy:    Patient receiving definitive chemoradiation therapy with weekly carboplatinum Raiford Noble     Interim History:  [name] is back for follow-up.  This is a first time that I have seen her.  She is a very charming 74 year old white female.  She obviously is incredibly tough.  She only has no other week left of treatment.  She has done incredibly well with treatment it looks like.  She has little bit of hoarseness.  She seems to be eating pretty well.  She is not having any vomiting.  There is no hemoptysis.  She is having no problems with diarrhea.  She has had no headache.  She is done very nicely with chemotherapy.  There is no neuropathy.  She has had no rashes.  She has had no leg swelling.  And there really has not been any issues with pain.  Currently, I would say her performance status is ECOG 1.  Medications:  Current Outpatient Medications:  .  Cholecalciferol (VITAMIN D) 50 MCG (2000 UT) tablet, Take 2,000 Units by mouth daily with supper., Disp: , Rfl:  .  dexamethasone (DECADRON) 4 MG tablet, Take 2 tablets (8 mg total) by mouth daily. Start the day after chemotherapy for 2 days., Disp: 30 tablet, Rfl: 1 .  diphenhydrAMINE (BENADRYL) 25 MG tablet, Take 50 mg by mouth daily as needed (allergic reaction)., Disp: , Rfl:  .  dorzolamide (TRUSOPT) 2 % ophthalmic solution, Place 1 drop into both eyes 2 (two) times daily. , Disp: , Rfl:  .  famotidine (PEPCID) 10 MG tablet, Take 10 mg by mouth daily as needed (take with benadryl when stung)., Disp: , Rfl:  .  levothyroxine (SYNTHROID) 88 MCG tablet, Take 1 tablet (88 mcg total) by mouth See admin instructions. Take 44 mcg on Mon and Fri, Take 88 mcg on Sun, Tues, Wed, Thurs, and Sat, Disp: 30 tablet, Rfl: 1 .   lidocaine-prilocaine (EMLA) cream, Apply to affected area once, Disp: 30 g, Rfl: 3 .  LORazepam (ATIVAN) 0.5 MG tablet, Take 1 tablet (0.5 mg total) by mouth every 6 (six) hours as needed (Nausea or vomiting)., Disp: 30 tablet, Rfl: 0 .  ondansetron (ZOFRAN) 8 MG tablet, Take 1 tablet (8 mg total) by mouth 2 (two) times daily as needed for refractory nausea / vomiting. Start on day 3 after chemo., Disp: 30 tablet, Rfl: 1 .  prochlorperazine (COMPAZINE) 10 MG tablet, Take 1 tablet (10 mg total) by mouth every 6 (six) hours as needed (Nausea or vomiting)., Disp: 30 tablet, Rfl: 1 .  sucralfate (CARAFATE) 1 g tablet, Take 1 tablet (1 g total) by mouth 4 (four) times daily -  with meals and at bedtime. As needed, Disp: 60 tablet, Rfl: 2 .  travoprost, benzalkonium, (TRAVATAN) 0.004 % ophthalmic solution, Place 1 drop into both eyes at bedtime.  , Disp: , Rfl:  No current facility-administered medications for this visit.  Facility-Administered Medications Ordered in Other Visits:  .  0.9 %  sodium chloride infusion, , Intravenous, Once, Tish Men, MD .  CARBOplatin (PARAPLATIN) 130 mg in sodium chloride 0.9 % 100 mL chemo infusion, 130 mg, Intravenous, Once, Tish Men, MD .  Abbe Amsterdam Pack 1 packet, 1 packet, Topical, Once PRN, Tish Men, MD .  dexamethasone (DECADRON) 20 mg in  sodium chloride 0.9 % 50 mL IVPB, 20 mg, Intravenous, Once, Tish Men, MD .  diphenhydrAMINE (BENADRYL) injection 50 mg, 50 mg, Intravenous, Once, Tish Men, MD .  famotidine (PEPCID) IVPB 20 mg premix, 20 mg, Intravenous, Once, Tish Men, MD .  heparin lock flush 100 unit/mL, 500 Units, Intracatheter, Once PRN, Tish Men, MD .  PACLitaxel (TAXOL) 66 mg in sodium chloride 0.9 % 150 mL chemo infusion (</= 80mg /m2), 45 mg/m2 (Treatment Plan Recorded), Intravenous, Once, Tish Men, MD .  palonosetron (ALOXI) injection 0.25 mg, 0.25 mg, Intravenous, Once, Tish Men, MD .  sodium chloride flush (NS) 0.9 % injection 10 mL, 10 mL,  Intracatheter, PRN, Tish Men, MD  Allergies:  Allergies  Allergen Reactions  . Bee Venom Swelling  . Timolol Nausea Only  . Brimonidine Nausea Only  . Hydrocodone Itching and Rash    Past Medical History, Surgical history, Social history, and Family History were reviewed and updated.  Review of Systems: Review of Systems  Constitutional: Negative.   HENT:   Positive for voice change.   Eyes: Negative.   Respiratory: Negative.   Cardiovascular: Negative.   Gastrointestinal: Negative.   Endocrine: Negative.   Genitourinary: Negative.    Musculoskeletal: Negative.   Skin: Negative.   Neurological: Negative.   Hematological: Negative.   Psychiatric/Behavioral: Negative.     Physical Exam:  weight is 112 lb 3.2 oz (50.9 kg).   Wt Readings from Last 3 Encounters:  07/06/19 112 lb 3.2 oz (50.9 kg)  06/22/19 113 lb (51.3 kg)  06/08/19 114 lb 0.6 oz (51.7 kg)    Physical Exam Vitals reviewed.  HENT:     Head: Normocephalic and atraumatic.  Eyes:     Pupils: Pupils are equal, round, and reactive to light.  Cardiovascular:     Rate and Rhythm: Normal rate and regular rhythm.     Heart sounds: Normal heart sounds.  Pulmonary:     Effort: Pulmonary effort is normal.     Breath sounds: Normal breath sounds.  Abdominal:     General: Bowel sounds are normal.     Palpations: Abdomen is soft.  Musculoskeletal:        General: No tenderness or deformity. Normal range of motion.     Cervical back: Normal range of motion.  Lymphadenopathy:     Cervical: No cervical adenopathy.  Skin:    General: Skin is warm and dry.     Findings: No erythema or rash.  Neurological:     Mental Status: She is alert and oriented to person, place, and time.  Psychiatric:        Behavior: Behavior normal.        Thought Content: Thought content normal.        Judgment: Judgment normal.      Lab Results  Component Value Date   WBC 2.7 (L) 07/06/2019   HGB 11.1 (L) 07/06/2019   HCT  34.1 (L) 07/06/2019   MCV 87.2 07/06/2019   PLT 204 07/06/2019     Chemistry      Component Value Date/Time   NA 130 (L) 07/06/2019 1006   K 4.0 07/06/2019 1006   CL 97 (L) 07/06/2019 1006   CO2 26 07/06/2019 1006   BUN 10 07/06/2019 1006   CREATININE 0.44 07/06/2019 1006      Component Value Date/Time   CALCIUM 9.1 07/06/2019 1006   ALKPHOS 101 07/06/2019 1006   AST 11 (L) 07/06/2019 1006   ALT 18 07/06/2019 1006  BILITOT 0.4 07/06/2019 1006      Impression and Plan: Ms. Lansdale is a very nice 74 year old white female with locally advanced stage IIIa small cell carcinoma the right lower lung.  She thankfully does not have any positive lymph nodes by bronchoscopy.  She is doing well with treatment.  I have believe that she is responding to treatment.  She will finish up treatment next week from what she tells me.  After that, I would probably wait a month or so before I would repeat any kind of scans on her.  I would have to wait for the radiation to get out of her system.  She would be a candidate for adjuvant immunotherapy with Durvalumab.  It was very nice talking with her.  I did give her a nice prayer blanket.  She does have a strong faith.  We will plan to get her back to see [place] in another few weeks.  I think she gets her last cycle of treatment next week.   [name], MD 5/25/202111:45 AM

## 2019-07-07 ENCOUNTER — Telehealth: Payer: Self-pay | Admitting: Hematology & Oncology

## 2019-07-07 ENCOUNTER — Other Ambulatory Visit: Payer: Self-pay

## 2019-07-07 ENCOUNTER — Ambulatory Visit
Admission: RE | Admit: 2019-07-07 | Discharge: 2019-07-07 | Disposition: A | Discharge status: Home / Self Care | Payer: Medicare Other | Source: Ambulatory Visit | Attending: Radiation Oncology | Admitting: Radiation Oncology

## 2019-07-07 DIAGNOSIS — Z510 Encounter for antineoplastic radiation therapy: Secondary | ICD-10-CM | POA: Diagnosis not present

## 2019-07-07 DIAGNOSIS — C3431 Malignant neoplasm of lower lobe, right bronchus or lung: Secondary | ICD-10-CM | POA: Diagnosis not present

## 2019-07-07 NOTE — Telephone Encounter (Signed)
Appointments scheduled and patient is ok with both date/time per 5/25 late los & 5/26 sch msg

## 2019-07-07 NOTE — Telephone Encounter (Signed)
No los 5/25

## 2019-07-08 ENCOUNTER — Ambulatory Visit
Admission: RE | Admit: 2019-07-08 | Discharge: 2019-07-08 | Disposition: A | Discharge status: Home / Self Care | Payer: Medicare Other | Source: Ambulatory Visit | Attending: Radiation Oncology | Admitting: Radiation Oncology

## 2019-07-08 ENCOUNTER — Other Ambulatory Visit: Payer: Self-pay

## 2019-07-08 DIAGNOSIS — Z510 Encounter for antineoplastic radiation therapy: Secondary | ICD-10-CM | POA: Diagnosis not present

## 2019-07-08 DIAGNOSIS — C3431 Malignant neoplasm of lower lobe, right bronchus or lung: Secondary | ICD-10-CM | POA: Diagnosis not present

## 2019-07-09 ENCOUNTER — Ambulatory Visit
Admission: RE | Admit: 2019-07-09 | Discharge: 2019-07-09 | Disposition: A | Discharge status: Home / Self Care | Payer: Medicare Other | Source: Ambulatory Visit | Attending: Radiation Oncology | Admitting: Radiation Oncology

## 2019-07-09 ENCOUNTER — Other Ambulatory Visit: Payer: Self-pay

## 2019-07-09 DIAGNOSIS — Z510 Encounter for antineoplastic radiation therapy: Secondary | ICD-10-CM | POA: Diagnosis not present

## 2019-07-09 DIAGNOSIS — C3431 Malignant neoplasm of lower lobe, right bronchus or lung: Secondary | ICD-10-CM | POA: Diagnosis not present

## 2019-07-13 ENCOUNTER — Inpatient Hospital Stay: Payer: Medicare Other

## 2019-07-13 ENCOUNTER — Other Ambulatory Visit: Payer: Self-pay

## 2019-07-13 ENCOUNTER — Inpatient Hospital Stay: Payer: Medicare Other | Attending: Hematology

## 2019-07-13 ENCOUNTER — Ambulatory Visit
Admission: RE | Admit: 2019-07-13 | Discharge: 2019-07-13 | Disposition: A | Discharge status: Home / Self Care | Payer: Medicare Other | Source: Ambulatory Visit | Attending: Radiation Oncology | Admitting: Radiation Oncology

## 2019-07-13 VITALS — BP 125/54 | HR 87 | Temp 97.6°F | Resp 17

## 2019-07-13 DIAGNOSIS — R05 Cough: Secondary | ICD-10-CM | POA: Insufficient documentation

## 2019-07-13 DIAGNOSIS — Z5111 Encounter for antineoplastic chemotherapy: Secondary | ICD-10-CM | POA: Insufficient documentation

## 2019-07-13 DIAGNOSIS — Z510 Encounter for antineoplastic radiation therapy: Secondary | ICD-10-CM | POA: Insufficient documentation

## 2019-07-13 DIAGNOSIS — C3431 Malignant neoplasm of lower lobe, right bronchus or lung: Secondary | ICD-10-CM | POA: Insufficient documentation

## 2019-07-13 DIAGNOSIS — R1310 Dysphagia, unspecified: Secondary | ICD-10-CM | POA: Insufficient documentation

## 2019-07-13 DIAGNOSIS — C3491 Malignant neoplasm of unspecified part of right bronchus or lung: Secondary | ICD-10-CM

## 2019-07-13 LAB — CMP (CANCER CENTER ONLY)
ALT: 17 U/L (ref 0–44)
AST: 9 U/L — ABNORMAL LOW (ref 15–41)
Albumin: 3.9 g/dL (ref 3.5–5.0)
Alkaline Phosphatase: 99 U/L (ref 38–126)
Anion gap: 7 (ref 5–15)
BUN: 13 mg/dL (ref 8–23)
CO2: 27 mmol/L (ref 22–32)
Calcium: 9.1 mg/dL (ref 8.9–10.3)
Chloride: 97 mmol/L — ABNORMAL LOW (ref 98–111)
Creatinine: 0.47 mg/dL (ref 0.44–1.00)
GFR, Est AFR Am: >60 mL/min (ref >60)
GFR, Estimated: >60 mL/min (ref >60)
Glucose, Bld: 96 mg/dL (ref 70–99)
Potassium: 3.8 mmol/L (ref 3.5–5.1)
Sodium: 131 mmol/L — ABNORMAL LOW (ref 135–145)
Total Bilirubin: 0.5 mg/dL (ref 0.3–1.2)
Total Protein: 6.5 g/dL (ref 6.5–8.1)

## 2019-07-13 LAB — CBC WITH DIFFERENTIAL (CANCER CENTER ONLY)
Abs Immature Granulocytes: 0.06 10*3/uL (ref 0.00–0.07)
Basophils Absolute: 0 10*3/uL (ref 0.0–0.1)
Basophils Relative: 1 %
Eosinophils Absolute: 0.1 10*3/uL (ref 0.0–0.5)
Eosinophils Relative: 2 %
HCT: 34.2 % — ABNORMAL LOW (ref 36.0–46.0)
Hemoglobin: 11.2 g/dL — ABNORMAL LOW (ref 12.0–15.0)
Immature Granulocytes: 2 %
Lymphocytes Relative: 16 %
Lymphs Abs: 0.4 10*3/uL — ABNORMAL LOW (ref 0.7–4.0)
MCH: 28.6 pg (ref 26.0–34.0)
MCHC: 32.7 g/dL (ref 30.0–36.0)
MCV: 87.2 fL (ref 80.0–100.0)
Monocytes Absolute: 0.4 10*3/uL (ref 0.1–1.0)
Monocytes Relative: 16 %
Neutro Abs: 1.6 10*3/uL — ABNORMAL LOW (ref 1.7–7.7)
Neutrophils Relative %: 63 %
Platelet Count: 204 10*3/uL (ref 150–400)
RBC: 3.92 MIL/uL (ref 3.87–5.11)
RDW: 17.4 % — ABNORMAL HIGH (ref 11.5–15.5)
WBC Count: 2.6 10*3/uL — ABNORMAL LOW (ref 4.0–10.5)
nRBC: 0 % (ref 0.0–0.2)

## 2019-07-13 MED ORDER — HEPARIN SOD (PORK) LOCK FLUSH 100 UNIT/ML IV SOLN
500.0000 [IU] | Freq: Once | INTRAVENOUS | Status: AC | PRN
  Administered 2019-07-13: 500 [IU]
  Filled 2019-07-13: qty 5

## 2019-07-13 MED ORDER — SODIUM CHLORIDE 0.9 % IV SOLN
Freq: Once | INTRAVENOUS | Status: AC
  Filled 2019-07-13: qty 250

## 2019-07-13 MED ORDER — PALONOSETRON HCL INJECTION 0.25 MG/5ML
INTRAVENOUS | Status: AC
  Filled 2019-07-13: qty 5

## 2019-07-13 MED ORDER — SODIUM CHLORIDE 0.9% FLUSH
10.0000 mL | INTRAVENOUS | Status: DC | PRN
  Administered 2019-07-13: 10 mL
  Filled 2019-07-13: qty 10

## 2019-07-13 MED ORDER — SODIUM CHLORIDE 0.9 % IV SOLN
132.8000 mg | Freq: Once | INTRAVENOUS | Status: AC
  Administered 2019-07-13: 130 mg via INTRAVENOUS
  Filled 2019-07-13: qty 13

## 2019-07-13 MED ORDER — DIPHENHYDRAMINE HCL 50 MG/ML IJ SOLN
INTRAMUSCULAR | Status: AC
  Filled 2019-07-13: qty 1

## 2019-07-13 MED ORDER — FAMOTIDINE IN NACL 20-0.9 MG/50ML-% IV SOLN
INTRAVENOUS | Status: AC
  Filled 2019-07-13: qty 50

## 2019-07-13 MED ORDER — DIPHENHYDRAMINE HCL 50 MG/ML IJ SOLN
50.0000 mg | Freq: Once | INTRAMUSCULAR | Status: AC
  Administered 2019-07-13: 50 mg via INTRAVENOUS

## 2019-07-13 MED ORDER — FAMOTIDINE IN NACL 20-0.9 MG/50ML-% IV SOLN
20.0000 mg | Freq: Once | INTRAVENOUS | Status: AC
  Administered 2019-07-13: 20 mg via INTRAVENOUS

## 2019-07-13 MED ORDER — PALONOSETRON HCL INJECTION 0.25 MG/5ML
0.2500 mg | Freq: Once | INTRAVENOUS | Status: AC
  Administered 2019-07-13: 0.25 mg via INTRAVENOUS

## 2019-07-13 MED ORDER — SODIUM CHLORIDE 0.9 % IV SOLN
45.0000 mg/m2 | Freq: Once | INTRAVENOUS | Status: AC
  Administered 2019-07-13: 66 mg via INTRAVENOUS
  Filled 2019-07-13: qty 11

## 2019-07-13 MED ORDER — SODIUM CHLORIDE 0.9 % IV SOLN
20.0000 mg | Freq: Once | INTRAVENOUS | Status: AC
  Administered 2019-07-13: 20 mg via INTRAVENOUS
  Filled 2019-07-13: qty 20

## 2019-07-13 NOTE — Progress Notes (Signed)
Reviewed labs with Dr. Ennever.  Ok to treat today. 

## 2019-07-13 NOTE — Patient Instructions (Signed)
Implanted Port Insertion, Care After °This sheet gives you information about how to care for yourself after your procedure. Your health care provider may also give you more specific instructions. If you have problems or questions, contact your health care provider. °What can I expect after the procedure? °After the procedure, it is common to have: °· Discomfort at the port insertion site. °· Bruising on the skin over the port. This should improve over 3-4 days. °Follow these instructions at home: °Port care °· After your port is placed, you will get a manufacturer's information card. The card has information about your port. Keep this card with you at all times. °· Take care of the port as told by your health care provider. Ask your health care provider if you or a family member can get training for taking care of the port at home. A home health care nurse may also take care of the port. °· Make sure to remember what type of port you have. °Incision care ° °  ° °· Follow instructions from your health care provider about how to take care of your port insertion site. Make sure you: °? Wash your hands with soap and water before and after you change your bandage (dressing). If soap and water are not available, use hand sanitizer. °? Change your dressing as told by your health care provider. °? Leave stitches (sutures), skin glue, or adhesive strips in place. These skin closures may need to stay in place for 2 weeks or longer. If adhesive strip edges start to loosen and curl up, you may trim the loose edges. Do not remove adhesive strips completely unless your health care provider tells you to do that. °· Check your port insertion site every day for signs of infection. Check for: °? Redness, swelling, or pain. °? Fluid or blood. °? Warmth. °? Pus or a bad smell. °Activity °· Return to your normal activities as told by your health care provider. Ask your health care provider what activities are safe for you. °· Do not  lift anything that is heavier than 10 lb (4.5 kg), or the limit that you are told, until your health care provider says that it is safe. °General instructions °· Take over-the-counter and prescription medicines only as told by your health care provider. °· Do not take baths, swim, or use a hot tub until your health care provider approves. Ask your health care provider if you may take showers. You may only be allowed to take sponge baths. °· Do not drive for 24 hours if you were given a sedative during your procedure. °· Wear a medical alert bracelet in case of an emergency. This will tell any health care providers that you have a port. °· Keep all follow-up visits as told by your health care provider. This is important. °Contact a health care provider if: °· You cannot flush your port with saline as directed, or you cannot draw blood from the port. °· You have a fever or chills. °· You have redness, swelling, or pain around your port insertion site. °· You have fluid or blood coming from your port insertion site. °· Your port insertion site feels warm to the touch. °· You have pus or a bad smell coming from the port insertion site. °Get help right away if: °· You have chest pain or shortness of breath. °· You have bleeding from your port that you cannot control. °Summary °· Take care of the port as told by your health   care provider. Keep the manufacturer's information card with you at all times. °· Change your dressing as told by your health care provider. °· Contact a health care provider if you have a fever or chills or if you have redness, swelling, or pain around your port insertion site. °· Keep all follow-up visits as told by your health care provider. °This information is not intended to replace advice given to you by your health care provider. Make sure you discuss any questions you have with your health care provider. °Document Revised: 08/26/2017 Document Reviewed: 08/26/2017 °Elsevier Patient Education ©  2020 [employer] ° °

## 2019-07-13 NOTE — Patient Instructions (Signed)
[facility] Discharge Instructions for Patients Receiving Chemotherapy  Today you received the following chemotherapy agents Carboplatin, Taxol  To help prevent nausea and vomiting after your treatment, we encourage you to take your nausea medication    If you develop nausea and vomiting that is not controlled by your nausea medication, call the clinic.   BELOW ARE SYMPTOMS THAT SHOULD BE REPORTED IMMEDIATELY:  *FEVER GREATER THAN 100.5 F  *CHILLS WITH OR WITHOUT FEVER  NAUSEA AND VOMITING THAT IS NOT CONTROLLED WITH YOUR NAUSEA MEDICATION  *UNUSUAL SHORTNESS OF BREATH  *UNUSUAL BRUISING OR BLEEDING  TENDERNESS IN MOUTH AND THROAT WITH OR WITHOUT PRESENCE OF ULCERS  *URINARY PROBLEMS  *BOWEL PROBLEMS  UNUSUAL RASH Items with * indicate a potential emergency and should be followed up as soon as possible.  Feel free to call the clinic should you have any questions or concerns. The clinic phone number is [phone].  Please show the CHEMO ALERT CARD at check-in to the Emergency Department and triage nurse.   

## 2019-07-14 ENCOUNTER — Ambulatory Visit
Admission: RE | Admit: 2019-07-14 | Discharge: 2019-07-14 | Disposition: A | Discharge status: Home / Self Care | Payer: Medicare Other | Source: Ambulatory Visit | Attending: Radiation Oncology | Admitting: Radiation Oncology

## 2019-07-14 ENCOUNTER — Other Ambulatory Visit: Payer: Self-pay

## 2019-07-14 DIAGNOSIS — Z510 Encounter for antineoplastic radiation therapy: Secondary | ICD-10-CM | POA: Diagnosis not present

## 2019-07-14 DIAGNOSIS — C3431 Malignant neoplasm of lower lobe, right bronchus or lung: Secondary | ICD-10-CM | POA: Diagnosis not present

## 2019-07-15 ENCOUNTER — Other Ambulatory Visit: Payer: Self-pay

## 2019-07-15 ENCOUNTER — Encounter: Payer: Self-pay | Admitting: *Deleted

## 2019-07-15 ENCOUNTER — Ambulatory Visit
Admission: RE | Admit: 2019-07-15 | Discharge: 2019-07-15 | Disposition: A | Discharge status: Home / Self Care | Payer: Medicare Other | Source: Ambulatory Visit | Attending: Radiation Oncology | Admitting: Radiation Oncology

## 2019-07-15 DIAGNOSIS — C3431 Malignant neoplasm of lower lobe, right bronchus or lung: Secondary | ICD-10-CM | POA: Diagnosis not present

## 2019-07-15 DIAGNOSIS — C3491 Malignant neoplasm of unspecified part of right bronchus or lung: Secondary | ICD-10-CM

## 2019-07-15 DIAGNOSIS — Z510 Encounter for antineoplastic radiation therapy: Secondary | ICD-10-CM | POA: Diagnosis not present

## 2019-07-15 MED ORDER — AMOXICILLIN 500 MG PO CAPS: 2000.0000 mg | ORAL_CAPSULE | Freq: Once | ORAL | 0 refills | Status: DC

## 2019-07-15 NOTE — Progress Notes (Signed)
Patient has a dental appointment and wants to make sure there is nothing special she needs to do before her appointment. She has a port.  Reviewed with Dr Marin Olp and he would like her to take amoxicillin one hour prior to her appointment.   Patient is aware of instruction and new prescription. Pharmacy confirmed.

## 2019-07-16 ENCOUNTER — Other Ambulatory Visit: Payer: Self-pay

## 2019-07-16 ENCOUNTER — Encounter: Payer: Self-pay | Admitting: Radiation Oncology

## 2019-07-16 ENCOUNTER — Ambulatory Visit
Admission: RE | Admit: 2019-07-16 | Discharge: 2019-07-16 | Disposition: A | Discharge status: Home / Self Care | Payer: Medicare Other | Source: Ambulatory Visit | Attending: Radiation Oncology | Admitting: Radiation Oncology

## 2019-07-16 DIAGNOSIS — C3431 Malignant neoplasm of lower lobe, right bronchus or lung: Secondary | ICD-10-CM | POA: Diagnosis not present

## 2019-07-16 DIAGNOSIS — Z510 Encounter for antineoplastic radiation therapy: Secondary | ICD-10-CM | POA: Diagnosis not present

## 2019-07-19 ENCOUNTER — Ambulatory Visit: Payer: Medicare Other

## 2019-07-20 ENCOUNTER — Ambulatory Visit: Payer: Medicare Other

## 2019-07-21 ENCOUNTER — Telehealth: Payer: Self-pay | Admitting: *Deleted

## 2019-07-21 ENCOUNTER — Other Ambulatory Visit (HOSPITAL_COMMUNITY): Payer: Self-pay | Admitting: Hematology & Oncology

## 2019-07-21 ENCOUNTER — Other Ambulatory Visit: Payer: Self-pay | Admitting: *Deleted

## 2019-07-21 ENCOUNTER — Ambulatory Visit: Payer: Medicare Other

## 2019-07-21 NOTE — Telephone Encounter (Signed)
Call received from patient stating that she is having "excruciating pain over her port every time she eats and that the pain is getting worse."  She denies any pain to her mouth or throat.  Dr. Marin Olp notified and call placed back to patient to notify her that Dr. Marin Olp would like her to go for a dye study to check port placement and that scheduling would be calling her to schedule it. Pt appreciative of call and has no questions at this time.

## 2019-07-21 NOTE — Telephone Encounter (Signed)
Call placed to Abigail Butts to notify her per order of Dr. Marin Olp that we will hold on port dye study at this time and to contact Dr. Lisbeth Renshaw about pt.'s symptoms.  Informed Abigail Butts that I have Ludger Nutting, Dr. Ida Rogue nurse and that Phineas Real will be calling her with further recommendations for pt.'s symptoms.  Abigail Butts is appreciative of call back and has no further questions at this time.

## 2019-07-21 NOTE — Telephone Encounter (Signed)
Call received from patient requesting that this RN call pt.'s daughter, Abigail Butts, so that she can "better describe my symptoms."  Call placed to Abigail Butts at [phone] and she states that she does not feel as though there is a problem with pt.'s port, but that pt has esophagitis.  Informed Abigail Butts that I would speak with Dr. Marin Olp and call her back with further orders.

## 2019-07-22 ENCOUNTER — Ambulatory Visit: Payer: Medicare Other

## 2019-07-22 ENCOUNTER — Telehealth: Payer: Self-pay | Admitting: *Deleted

## 2019-07-22 ENCOUNTER — Other Ambulatory Visit: Payer: Self-pay | Admitting: Radiation Oncology

## 2019-07-22 MED ORDER — LIDOCAINE VISCOUS HCL 2 % MT SOLN
10.0000 mL | Freq: Four times a day (QID) | OROMUCOSAL | 0 refills | Status: DC | PRN
Start: 2019-07-22 — End: 2019-12-16

## 2019-07-22 NOTE — Telephone Encounter (Signed)
Spoke with the patients daughter Abigail Butts to let her know that we sent in a prescription for lidocaine to the pharmacy.  Instructions for use reviewed and Abigail Butts verbalized understanding.  Pharmacy verified.  Will continue to follow as necessary.  Gloriajean Dell. Leonie Green, BSN

## 2019-07-23 ENCOUNTER — Ambulatory Visit: Payer: Medicare Other

## 2019-07-26 ENCOUNTER — Ambulatory Visit: Payer: Medicare Other

## 2019-07-27 ENCOUNTER — Other Ambulatory Visit: Payer: Medicare Other

## 2019-07-27 ENCOUNTER — Ambulatory Visit: Payer: Medicare Other | Admitting: Hematology & Oncology

## 2019-07-30 ENCOUNTER — Telehealth: Payer: Self-pay | Admitting: *Deleted

## 2019-07-30 NOTE — Telephone Encounter (Signed)
Returned patient's phone call, spoke with patient 

## 2019-08-03 ENCOUNTER — Encounter: Payer: Self-pay | Admitting: Hematology & Oncology

## 2019-08-03 ENCOUNTER — Inpatient Hospital Stay (HOSPITAL_BASED_OUTPATIENT_CLINIC_OR_DEPARTMENT_OTHER): Payer: Medicare Other | Admitting: Hematology & Oncology

## 2019-08-03 ENCOUNTER — Telehealth: Payer: Self-pay | Admitting: *Deleted

## 2019-08-03 ENCOUNTER — Inpatient Hospital Stay: Payer: Medicare Other

## 2019-08-03 ENCOUNTER — Other Ambulatory Visit: Payer: Self-pay | Admitting: *Deleted

## 2019-08-03 ENCOUNTER — Other Ambulatory Visit: Payer: Self-pay

## 2019-08-03 VITALS — BP 149/54 | HR 115 | Temp 97.7°F | Resp 20 | Wt 108.0 lb

## 2019-08-03 DIAGNOSIS — Z5111 Encounter for antineoplastic chemotherapy: Secondary | ICD-10-CM | POA: Diagnosis not present

## 2019-08-03 DIAGNOSIS — R1310 Dysphagia, unspecified: Secondary | ICD-10-CM | POA: Diagnosis not present

## 2019-08-03 DIAGNOSIS — C3490 Malignant neoplasm of unspecified part of unspecified bronchus or lung: Secondary | ICD-10-CM

## 2019-08-03 DIAGNOSIS — C3431 Malignant neoplasm of lower lobe, right bronchus or lung: Secondary | ICD-10-CM | POA: Diagnosis not present

## 2019-08-03 DIAGNOSIS — R05 Cough: Secondary | ICD-10-CM | POA: Diagnosis not present

## 2019-08-03 LAB — CMP (CANCER CENTER ONLY)
ALT: 53 U/L — ABNORMAL HIGH (ref 0–44)
AST: 22 U/L (ref 15–41)
Albumin: 3.8 g/dL (ref 3.5–5.0)
Alkaline Phosphatase: 117 U/L (ref 38–126)
Anion gap: 8 (ref 5–15)
BUN: 9 mg/dL (ref 8–23)
CO2: 27 mmol/L (ref 22–32)
Calcium: 9.4 mg/dL (ref 8.9–10.3)
Chloride: 93 mmol/L — ABNORMAL LOW (ref 98–111)
Creatinine: 0.42 mg/dL — ABNORMAL LOW (ref 0.44–1.00)
GFR, Est AFR Am: >60 mL/min (ref >60)
GFR, Estimated: >60 mL/min (ref >60)
Glucose, Bld: 199 mg/dL — ABNORMAL HIGH (ref 70–99)
Potassium: 3.4 mmol/L — ABNORMAL LOW (ref 3.5–5.1)
Sodium: 128 mmol/L — ABNORMAL LOW (ref 135–145)
Total Bilirubin: 0.4 mg/dL (ref 0.3–1.2)
Total Protein: 6.9 g/dL (ref 6.5–8.1)

## 2019-08-03 LAB — CBC WITH DIFFERENTIAL (CANCER CENTER ONLY)
Abs Immature Granulocytes: 0.04 10*3/uL (ref 0.00–0.07)
Basophils Absolute: 0 10*3/uL (ref 0.0–0.1)
Basophils Relative: 0 %
Eosinophils Absolute: 0.2 10*3/uL (ref 0.0–0.5)
Eosinophils Relative: 3 %
HCT: 31 % — ABNORMAL LOW (ref 36.0–46.0)
Hemoglobin: 10.5 g/dL — ABNORMAL LOW (ref 12.0–15.0)
Immature Granulocytes: 1 %
Lymphocytes Relative: 6 %
Lymphs Abs: 0.3 10*3/uL — ABNORMAL LOW (ref 0.7–4.0)
MCH: 29.1 pg (ref 26.0–34.0)
MCHC: 33.9 g/dL (ref 30.0–36.0)
MCV: 85.9 fL (ref 80.0–100.0)
Monocytes Absolute: 0.6 10*3/uL (ref 0.1–1.0)
Monocytes Relative: 12 %
Neutro Abs: 3.9 10*3/uL (ref 1.7–7.7)
Neutrophils Relative %: 78 %
Platelet Count: 283 10*3/uL (ref 150–400)
RBC: 3.61 MIL/uL — ABNORMAL LOW (ref 3.87–5.11)
RDW: 16.7 % — ABNORMAL HIGH (ref 11.5–15.5)
WBC Count: 5 10*3/uL (ref 4.0–10.5)
nRBC: 0 % (ref 0.0–0.2)

## 2019-08-03 LAB — LACTATE DEHYDROGENASE: LDH: 228 U/L — ABNORMAL HIGH (ref 98–192)

## 2019-08-03 MED ORDER — PREDNISONE 20 MG PO TABS
ORAL_TABLET | ORAL | 0 refills | Status: DC
Start: 2019-08-03 — End: 2019-12-16

## 2019-08-03 MED ORDER — HYDROCOD POLST-CPM POLST ER 10-8 MG/5ML PO SUER: 5.0000 mL | Freq: Two times a day (BID) | ORAL | 0 refills | Status: DC | PRN

## 2019-08-03 MED ORDER — BENZONATATE 100 MG PO CAPS
100.0000 mg | ORAL_CAPSULE | Freq: Three times a day (TID) | ORAL | 0 refills | Status: DC | PRN
Start: 2019-08-03 — End: 2019-12-16

## 2019-08-03 NOTE — Telephone Encounter (Signed)
Call received from patient stating that she can not take Tussionex d/t she has an allergy to Hydrocodone and would like to know what else she can take.  Pt notified per order of S. Dover NP to take OTC Delsym and Tessalon which will be sent in to her [place].  Pt appreciative of assistance and has no further questions at this time.

## 2019-08-03 NOTE — Patient Instructions (Signed)

## 2019-08-03 NOTE — Progress Notes (Signed)
Hematology and Oncology Follow Up Visit  [name] [id number] [birth year] 74 y.o. 08/03/2019   Principle Diagnosis:   Stage IIIA (T4N0M0) squamous cell carcinoma the right lower lung  Current Therapy:    Patient completed definitive chemoradiation therapy with weekly carboplatinum /Taxol  -completed on 07/16/2019     Interim History:  [name] is back for follow-up.  She completed her radiation and chemotherapy about 3 weeks ago.  She is having some problems with this however.  She is having a lot of coughing.  I suspect she probably has some inflammation of the lungs.  She is having some dysphagia and odynophagia.  The cough is nonproductive.  She has been taking some over-the-counter cough remedy.  This really has not helped her.  I suspect we will have to give her some steroids along with some prescription cough medication to try to ease up the cough.  I think if we can do this and she will eat better.  She has had no fever.  She has had no bleeding.  There is been no change in bowel or bladder habits..  Currently, I would say her performance status is ECOG 1.  Medications:  Current Outpatient Medications:  .  Cholecalciferol (VITAMIN D) 50 MCG (2000 UT) tablet, Take 2,000 Units by mouth daily with supper., Disp: , Rfl:  .  dexamethasone (DECADRON) 4 MG tablet, Take 2 tablets (8 mg total) by mouth daily. Start the day after chemotherapy for 2 days., Disp: 30 tablet, Rfl: 1 .  diphenhydrAMINE (BENADRYL) 25 MG tablet, Take 50 mg by mouth daily as needed (allergic reaction)., Disp: , Rfl:  .  dorzolamide (TRUSOPT) 2 % ophthalmic solution, Place 1 drop into both eyes 2 (two) times daily. , Disp: , Rfl:  .  famotidine (PEPCID) 10 MG tablet, Take 10 mg by mouth daily as needed (take with benadryl when stung)., Disp: , Rfl:  .  levothyroxine (SYNTHROID) 88 MCG tablet, Take 1 tablet (88 mcg total) by mouth See admin instructions. Take 44 mcg on Mon and Fri, Take 88 mcg on Sun,  Tues, Wed, Thurs, and Sat, Disp: 30 tablet, Rfl: 1 .  lidocaine (XYLOCAINE) 2 % solution, Use as directed 10 mLs in the mouth or throat every 6 (six) hours as needed for mouth pain. Swallow as needed Q6hr prn, Disp: 450 mL, Rfl: 0 .  lidocaine-prilocaine (EMLA) cream, Apply to affected area once, Disp: 30 g, Rfl: 3 .  LORazepam (ATIVAN) 0.5 MG tablet, Take 1 tablet (0.5 mg total) by mouth every 6 (six) hours as needed (Nausea or vomiting)., Disp: 30 tablet, Rfl: 0 .  ondansetron (ZOFRAN) 8 MG tablet, Take 1 tablet (8 mg total) by mouth 2 (two) times daily as needed for refractory nausea / vomiting. Start on day 3 after chemo., Disp: 30 tablet, Rfl: 1 .  prochlorperazine (COMPAZINE) 10 MG tablet, Take 1 tablet (10 mg total) by mouth every 6 (six) hours as needed (Nausea or vomiting)., Disp: 30 tablet, Rfl: 1 .  sucralfate (CARAFATE) 1 g tablet, Take 1 tablet (1 g total) by mouth 4 (four) times daily -  with meals and at bedtime. As needed, Disp: 60 tablet, Rfl: 2 .  travoprost, benzalkonium, (TRAVATAN) 0.004 % ophthalmic solution, Place 1 drop into both eyes at bedtime.  , Disp: , Rfl:   Allergies:  Allergies  Allergen Reactions  . Bee Venom Swelling  . Timolol Nausea Only  . Brimonidine Nausea Only  . Hydrocodone Itching and Rash  Past Medical History, Surgical history, Social history, and Family History were reviewed and updated.  Review of Systems: Review of Systems  Constitutional: Negative.   HENT:   Positive for voice change.   Eyes: Negative.   Respiratory: Negative.   Cardiovascular: Negative.   Gastrointestinal: Negative.   Endocrine: Negative.   Genitourinary: Negative.    Musculoskeletal: Negative.   Skin: Negative.   Neurological: Negative.   Hematological: Negative.   Psychiatric/Behavioral: Negative.     Physical Exam:  weight is 108 lb (49 kg). Her temporal temperature is 97.7 F (36.5 C). Her blood pressure is 149/54 (abnormal) and her pulse is 115 (abnormal).  Her respiration is 20 and oxygen saturation is 98%.   Wt Readings from Last 3 Encounters:  08/03/19 108 lb (49 kg)  07/06/19 112 lb 3.2 oz (50.9 kg)  06/22/19 113 lb (51.3 kg)    Physical Exam Vitals reviewed.  HENT:     Head: Normocephalic and atraumatic.  Eyes:     Pupils: Pupils are equal, round, and reactive to light.  Cardiovascular:     Rate and Rhythm: Normal rate and regular rhythm.     Heart sounds: Normal heart sounds.  Pulmonary:     Effort: Pulmonary effort is normal.     Breath sounds: Normal breath sounds.  Abdominal:     General: Bowel sounds are normal.     Palpations: Abdomen is soft.  Musculoskeletal:        General: No tenderness or deformity. Normal range of motion.     Cervical back: Normal range of motion.  Lymphadenopathy:     Cervical: No cervical adenopathy.  Skin:    General: Skin is warm and dry.     Findings: No erythema or rash.  Neurological:     Mental Status: She is alert and oriented to person, place, and time.  Psychiatric:        Behavior: Behavior normal.        Thought Content: Thought content normal.        Judgment: Judgment normal.      Lab Results  Component Value Date   WBC 5.0 08/03/2019   HGB 10.5 (L) 08/03/2019   HCT 31.0 (L) 08/03/2019   MCV 85.9 08/03/2019   PLT 283 08/03/2019     Chemistry      Component Value Date/Time   NA 131 (L) 07/13/2019 0929   K 3.8 07/13/2019 0929   CL 97 (L) 07/13/2019 0929   CO2 27 07/13/2019 0929   BUN 13 07/13/2019 0929   CREATININE 0.47 07/13/2019 0929      Component Value Date/Time   CALCIUM 9.1 07/13/2019 0929   ALKPHOS 99 07/13/2019 0929   AST 9 (L) 07/13/2019 0929   ALT 17 07/13/2019 0929   BILITOT 0.5 07/13/2019 0929      Impression and Plan: [name] is a very nice 74 year old white female with locally advanced stage IIIa non-small cell carcinoma the right lower lung.  She thankfully does not have any positive lymph nodes by bronchoscopy.  Now that she has  completed her treatments, we will do our follow-ups PET scan.  With the PET scan about 6 weeks.  Again the steroids and cough medicine should help her.  She will be on a steroid taper.  I told her how to do the steroid taper.  I told her to give [place] a call if she has having more problems.  I suspect that she is responding.  We should be  able to do immunotherapy after we see her back in 6 weeks.     [name], MD 6/22/202111:42 AM

## 2019-08-04 ENCOUNTER — Encounter: Payer: Self-pay | Admitting: *Deleted

## 2019-08-04 NOTE — Progress Notes (Signed)
Patient seen in follow up with Dr Marin Olp. She will need a post treatment PET in approx 6 weeks.  PET Scan scheduled for 09/09/19 at 9am. Patient called and appointment date, time and location given. Also reviewed all the necessary information for PET prep. Letter with PET instructions will also be mailed to patient home for reinforcement of education.

## 2019-08-10 ENCOUNTER — Encounter: Payer: Self-pay | Admitting: *Deleted

## 2019-08-10 NOTE — Progress Notes (Signed)
Patient c/o sore throat which started yesterday. She states her throat is sore at all time and she has developed some hoarseness. She also notes some congestion at her throat. She denies any fever, chest or nasal congestion, or cough increased from her chronic cough.   Reviewed symptoms with Dr Marin Olp. He would like her to use over the counter symptoms management. If she develops a fever, she is to call us back.   Patient understands instructions, confirmed with teach back.

## 2019-09-07 ENCOUNTER — Telehealth: Payer: Self-pay | Admitting: Radiation Oncology

## 2019-09-07 NOTE — Telephone Encounter (Signed)
  Radiation Oncology         [phone] ________________________________  Name: DIA DONATE MRN: [id number]  Date of Service: 09/07/2019  DOB: May 07, 1945  Post Treatment Telephone Note  Diagnosis:  Stage IIIA, cT4N0M0, NSCLC, squamous cell carcinoma of the RLL.  Interval Since Last Radiation:  8 weeks   06/01/19-07/16/19: The RLL target and regional nodes were treated to 60 Gy in 30 fractions followed by a 6 Gy boost in 3 fractions.  Narrative:  The patient was contacted today for routine follow-up. During treatment she did very well with radiotherapy and did not have significant desquamation. She did have radiation esophagitis. She is scheduled for repeat PET scan tomorrow and to see Dr. Marin Olp next Tuesday.  Impression/Plan: 1. Stage IIIA, cT4N0M0, NSCLC, squamous cell carcinoma of the RLL. I was unable to reach the patient but left a voicemail and on it I discussed that we would be happy to continue to follow her as needed, but she will also continue to follow up with Dr. Marin Olp in medical oncology. I left contact information for her to return my call if she had questions or concerns.    Carola Rhine, PAC

## 2019-09-08 ENCOUNTER — Telehealth: Payer: Self-pay | Admitting: Radiation Oncology

## 2019-09-08 NOTE — Telephone Encounter (Signed)
The patient called back and let me know she's had some coughing that has improved as well as improvement in esophagitis and soreness with swallowing. We will follow her expectantly.

## 2019-09-09 ENCOUNTER — Other Ambulatory Visit: Payer: Self-pay

## 2019-09-09 ENCOUNTER — Telehealth: Payer: Self-pay | Admitting: *Deleted

## 2019-09-09 ENCOUNTER — Ambulatory Visit (HOSPITAL_COMMUNITY)
Admission: RE | Admit: 2019-09-09 | Discharge: 2019-09-09 | Disposition: A | Discharge status: Home / Self Care | Payer: Medicare Other | Source: Ambulatory Visit | Attending: Hematology & Oncology | Admitting: Hematology & Oncology

## 2019-09-09 DIAGNOSIS — I700 Atherosclerosis of aorta: Secondary | ICD-10-CM | POA: Insufficient documentation

## 2019-09-09 DIAGNOSIS — J439 Emphysema, unspecified: Secondary | ICD-10-CM | POA: Insufficient documentation

## 2019-09-09 DIAGNOSIS — R918 Other nonspecific abnormal finding of lung field: Secondary | ICD-10-CM | POA: Diagnosis not present

## 2019-09-09 DIAGNOSIS — C3490 Malignant neoplasm of unspecified part of unspecified bronchus or lung: Secondary | ICD-10-CM

## 2019-09-09 MED ORDER — FLUDEOXYGLUCOSE F - 18 (FDG) INJECTION
5.5000 | Freq: Once | INTRAVENOUS | Status: AC | PRN
  Administered 2019-09-09: 5.5 via INTRAVENOUS

## 2019-09-09 NOTE — Telephone Encounter (Signed)
-----   Message from [name], MD sent at 09/09/2019  4:24 PM EDT ----- Call - the tumor has responded very well. It has shrunk by over 60%.  It will continue to shrink!!  [name]

## 2019-09-09 NOTE — Telephone Encounter (Signed)
As noted below by Dr. Marin Olp, I informed the patient the tumor has responded very well and has shrunk by 60%. She verbalized understanding.

## 2019-09-10 LAB — GLUCOSE, CAPILLARY: Glucose-Capillary: 100 mg/dL — ABNORMAL HIGH (ref 70–99)

## 2019-09-14 ENCOUNTER — Inpatient Hospital Stay: Payer: Medicare Other

## 2019-09-14 ENCOUNTER — Encounter: Payer: Self-pay | Admitting: Hematology & Oncology

## 2019-09-14 ENCOUNTER — Telehealth: Payer: Self-pay | Admitting: Hematology & Oncology

## 2019-09-14 ENCOUNTER — Other Ambulatory Visit: Payer: Self-pay

## 2019-09-14 ENCOUNTER — Inpatient Hospital Stay: Payer: Medicare Other | Attending: Hematology

## 2019-09-14 ENCOUNTER — Inpatient Hospital Stay (HOSPITAL_BASED_OUTPATIENT_CLINIC_OR_DEPARTMENT_OTHER): Payer: Medicare Other | Admitting: Hematology & Oncology

## 2019-09-14 VITALS — BP 130/83 | HR 90 | Temp 98.3°F | Resp 20 | Wt 109.5 lb

## 2019-09-14 DIAGNOSIS — C3431 Malignant neoplasm of lower lobe, right bronchus or lung: Secondary | ICD-10-CM | POA: Insufficient documentation

## 2019-09-14 DIAGNOSIS — Z5112 Encounter for antineoplastic immunotherapy: Secondary | ICD-10-CM | POA: Insufficient documentation

## 2019-09-14 DIAGNOSIS — C3490 Malignant neoplasm of unspecified part of unspecified bronchus or lung: Secondary | ICD-10-CM

## 2019-09-14 DIAGNOSIS — Z9221 Personal history of antineoplastic chemotherapy: Secondary | ICD-10-CM | POA: Diagnosis not present

## 2019-09-14 DIAGNOSIS — Z923 Personal history of irradiation: Secondary | ICD-10-CM | POA: Insufficient documentation

## 2019-09-14 DIAGNOSIS — R918 Other nonspecific abnormal finding of lung field: Secondary | ICD-10-CM

## 2019-09-14 LAB — CMP (CANCER CENTER ONLY)
ALT: 13 U/L (ref 0–44)
AST: 8 U/L — ABNORMAL LOW (ref 15–41)
Albumin: 4.2 g/dL (ref 3.5–5.0)
Alkaline Phosphatase: 76 U/L (ref 38–126)
Anion gap: 7 (ref 5–15)
BUN: 17 mg/dL (ref 8–23)
CO2: 28 mmol/L (ref 22–32)
Calcium: 9.9 mg/dL (ref 8.9–10.3)
Chloride: 98 mmol/L (ref 98–111)
Creatinine: 0.44 mg/dL (ref 0.44–1.00)
GFR, Est AFR Am: >60 mL/min (ref >60)
GFR, Estimated: >60 mL/min (ref >60)
Glucose, Bld: 132 mg/dL — ABNORMAL HIGH (ref 70–99)
Potassium: 3.9 mmol/L (ref 3.5–5.1)
Sodium: 133 mmol/L — ABNORMAL LOW (ref 135–145)
Total Bilirubin: 0.4 mg/dL (ref 0.3–1.2)
Total Protein: 6.9 g/dL (ref 6.5–8.1)

## 2019-09-14 LAB — CBC WITH DIFFERENTIAL (CANCER CENTER ONLY)
Abs Immature Granulocytes: 0.02 10*3/uL (ref 0.00–0.07)
Basophils Absolute: 0 10*3/uL (ref 0.0–0.1)
Basophils Relative: 0 %
Eosinophils Absolute: 0.1 10*3/uL (ref 0.0–0.5)
Eosinophils Relative: 2 %
HCT: 35.5 % — ABNORMAL LOW (ref 36.0–46.0)
Hemoglobin: 11.6 g/dL — ABNORMAL LOW (ref 12.0–15.0)
Immature Granulocytes: 1 %
Lymphocytes Relative: 15 %
Lymphs Abs: 0.6 10*3/uL — ABNORMAL LOW (ref 0.7–4.0)
MCH: 29.5 pg (ref 26.0–34.0)
MCHC: 32.7 g/dL (ref 30.0–36.0)
MCV: 90.3 fL (ref 80.0–100.0)
Monocytes Absolute: 0.6 10*3/uL (ref 0.1–1.0)
Monocytes Relative: 14 %
Neutro Abs: 2.8 10*3/uL (ref 1.7–7.7)
Neutrophils Relative %: 68 %
Platelet Count: 255 10*3/uL (ref 150–400)
RBC: 3.93 MIL/uL (ref 3.87–5.11)
RDW: 15.9 % — ABNORMAL HIGH (ref 11.5–15.5)
WBC Count: 4.1 10*3/uL (ref 4.0–10.5)
nRBC: 0 % (ref 0.0–0.2)

## 2019-09-14 MED ORDER — SODIUM CHLORIDE 0.9% FLUSH
10.0000 mL | INTRAVENOUS | Status: DC | PRN
  Administered 2019-09-14: 10 mL via INTRAVENOUS
  Filled 2019-09-14: qty 10

## 2019-09-14 MED ORDER — HEPARIN SOD (PORK) LOCK FLUSH 100 UNIT/ML IV SOLN
500.0000 [IU] | Freq: Once | INTRAVENOUS | Status: AC
  Administered 2019-09-14: 500 [IU] via INTRAVENOUS
  Filled 2019-09-14: qty 5

## 2019-09-14 NOTE — Progress Notes (Signed)
DISCONTINUE ON PATHWAY REGIMEN - Non-Small Cell Lung     Administer weekly:     Paclitaxel      Carboplatin   **Always confirm dose/schedule in your pharmacy ordering system**  REASON: Other Reason PRIOR TREATMENT: [id number]: Carboplatin AUC=2 + Paclitaxel 45 mg/m2 Weekly During Radiation TREATMENT RESPONSE: N/A - Adjuvant Therapy  START ON PATHWAY REGIMEN - Non-Small Cell Lung     A cycle is every 28 days:     Durvalumab   **Always confirm dose/schedule in your pharmacy ordering system**  Patient Characteristics: Preoperative or Nonsurgical Candidate (Clinical Staging), Stage III - Nonsurgical Candidate (Nonsquamous and Squamous), PS = 0, 1 Therapeutic Status: Preoperative or Nonsurgical Candidate (Clinical Staging) AJCC T Category: cT4 AJCC N Category: cN0 AJCC M Category: cM0 AJCC 8 Stage Grouping: IIIA ECOG Performance Status: 1 Intent of Therapy: Curative Intent, Discussed with Patient

## 2019-09-14 NOTE — Patient Instructions (Signed)
Implanted Port Insertion, Care After °This sheet gives you information about how to care for yourself after your procedure. Your health care provider may also give you more specific instructions. If you have problems or questions, contact your health care provider. °What can I expect after the procedure? °After the procedure, it is common to have: °· Discomfort at the port insertion site. °· Bruising on the skin over the port. This should improve over 3-4 days. °Follow these instructions at home: °Port care °· After your port is placed, you will get a manufacturer's information card. The card has information about your port. Keep this card with you at all times. °· Take care of the port as told by your health care provider. Ask your health care provider if you or a family member can get training for taking care of the port at home. A home health care nurse may also take care of the port. °· Make sure to remember what type of port you have. °Incision care ° °  ° °· Follow instructions from your health care provider about how to take care of your port insertion site. Make sure you: °? Wash your hands with soap and water before and after you change your bandage (dressing). If soap and water are not available, use hand sanitizer. °? Change your dressing as told by your health care provider. °? Leave stitches (sutures), skin glue, or adhesive strips in place. These skin closures may need to stay in place for 2 weeks or longer. If adhesive strip edges start to loosen and curl up, you may trim the loose edges. Do not remove adhesive strips completely unless your health care provider tells you to do that. °· Check your port insertion site every day for signs of infection. Check for: °? Redness, swelling, or pain. °? Fluid or blood. °? Warmth. °? Pus or a bad smell. °Activity °· Return to your normal activities as told by your health care provider. Ask your health care provider what activities are safe for you. °· Do not  lift anything that is heavier than 10 lb (4.5 kg), or the limit that you are told, until your health care provider says that it is safe. °General instructions °· Take over-the-counter and prescription medicines only as told by your health care provider. °· Do not take baths, swim, or use a hot tub until your health care provider approves. Ask your health care provider if you may take showers. You may only be allowed to take sponge baths. °· Do not drive for 24 hours if you were given a sedative during your procedure. °· Wear a medical alert bracelet in case of an emergency. This will tell any health care providers that you have a port. °· Keep all follow-up visits as told by your health care provider. This is important. °Contact a health care provider if: °· You cannot flush your port with saline as directed, or you cannot draw blood from the port. °· You have a fever or chills. °· You have redness, swelling, or pain around your port insertion site. °· You have fluid or blood coming from your port insertion site. °· Your port insertion site feels warm to the touch. °· You have pus or a bad smell coming from the port insertion site. °Get help right away if: °· You have chest pain or shortness of breath. °· You have bleeding from your port that you cannot control. °Summary °· Take care of the port as told by your health   care provider. Keep the manufacturer's information card with you at all times. °· Change your dressing as told by your health care provider. °· Contact a health care provider if you have a fever or chills or if you have redness, swelling, or pain around your port insertion site. °· Keep all follow-up visits as told by your health care provider. °This information is not intended to replace advice given to you by your health care provider. Make sure you discuss any questions you have with your health care provider. °Document Revised: 08/26/2017 Document Reviewed: 08/26/2017 °Elsevier Patient Education ©  2020 [employer] ° °

## 2019-09-14 NOTE — Telephone Encounter (Signed)
LMVm regarding appointments scheduled. Calendar mailed per 8/3 los

## 2019-09-14 NOTE — Progress Notes (Signed)
Hematology and Oncology Follow Up Visit  [name] [id number] 1945/02/24 74 y.o. 09/14/2019   Principle Diagnosis:   Stage IIIA (T4N0M0) squamous cell carcinoma the right lower lung  Current Therapy:    Patient completed definitive chemoradiation therapy with weekly carboplatinum /Taxol  -completed on 07/16/2019     Interim History:  [name] is back for follow-up.  She completed her radiation and chemotherapy about 6 weeks ago.  We did go ahead and do a PET scan on her.  This was done last week.  Thankfully, the PET scan showed a very nice response.  There is decrease in the right lower lobe mass.  And now measures 4.2 x 2.8 cm.  The SUV is only 3.2.  There is some stable hyper metabolism in very small mediastinal lymph nodes.  I suspect these are reactive.  There is no evidence of any new disease.  She deftly is ready for immunotherapy now.  By the results of the PACIFIC trial, immunotherapy after radiation and chemotherapy clearly improves survival.  We will use Durvalumab as per the protocol.  She has a improved cough.  She does have some radiation pneumonitis.  I think she has been on some prednisone.  Her appetite is picking up a little bit.  There is no problems with fever.  She has had no bleeding.  She has had no diarrhea.  Currently, I would say her performance status is ECOG 1.  Medications:  Current Outpatient Medications:  .  benzonatate (TESSALON) 100 MG capsule, Take 1 capsule (100 mg total) by mouth 3 (three) times daily as needed for cough., Disp: 20 capsule, Rfl: 0 .  chlorpheniramine-HYDROcodone (TUSSIONEX) 10-8 MG/5ML SUER, Take 5 mLs by mouth every 12 (twelve) hours as needed for cough., Disp: 250 mL, Rfl: 0 .  Cholecalciferol (VITAMIN D) 50 MCG (2000 UT) tablet, Take 2,000 Units by mouth daily with supper., Disp: , Rfl:  .  dexamethasone (DECADRON) 4 MG tablet, Take 2 tablets (8 mg total) by mouth daily. Start the day after chemotherapy for 2 days.,  Disp: 30 tablet, Rfl: 1 .  diphenhydrAMINE (BENADRYL) 25 MG tablet, Take 50 mg by mouth daily as needed (allergic reaction)., Disp: , Rfl:  .  dorzolamide (TRUSOPT) 2 % ophthalmic solution, Place 1 drop into both eyes 2 (two) times daily. , Disp: , Rfl:  .  famotidine (PEPCID) 10 MG tablet, Take 10 mg by mouth daily as needed (take with benadryl when stung)., Disp: , Rfl:  .  levothyroxine (SYNTHROID) 88 MCG tablet, Take 1 tablet (88 mcg total) by mouth See admin instructions. Take 44 mcg on Mon and Fri, Take 88 mcg on Sun, Tues, Wed, Thurs, and Sat, Disp: 30 tablet, Rfl: 1 .  lidocaine (XYLOCAINE) 2 % solution, Use as directed 10 mLs in the mouth or throat every 6 (six) hours as needed for mouth pain. Swallow as needed Q6hr prn, Disp: 450 mL, Rfl: 0 .  lidocaine-prilocaine (EMLA) cream, Apply to affected area once, Disp: 30 g, Rfl: 3 .  LORazepam (ATIVAN) 0.5 MG tablet, Take 1 tablet (0.5 mg total) by mouth every 6 (six) hours as needed (Nausea or vomiting)., Disp: 30 tablet, Rfl: 0 .  ondansetron (ZOFRAN) 8 MG tablet, Take 1 tablet (8 mg total) by mouth 2 (two) times daily as needed for refractory nausea / vomiting. Start on day 3 after chemo., Disp: 30 tablet, Rfl: 1 .  predniSONE (DELTASONE) 20 MG tablet, Take 3 pills daily x 5 days, then  2 pills daily x 5 days, then 1 pill x 3 days, then 1/2 pill x 5 days, Disp: 35 tablet, Rfl: 0 .  prochlorperazine (COMPAZINE) 10 MG tablet, Take 1 tablet (10 mg total) by mouth every 6 (six) hours as needed (Nausea or vomiting)., Disp: 30 tablet, Rfl: 1 .  sucralfate (CARAFATE) 1 g tablet, Take 1 tablet (1 g total) by mouth 4 (four) times daily -  with meals and at bedtime. As needed, Disp: 60 tablet, Rfl: 2 .  travoprost, benzalkonium, (TRAVATAN) 0.004 % ophthalmic solution, Place 1 drop into both eyes at bedtime.  , Disp: , Rfl:   Allergies:  Allergies  Allergen Reactions  . Bee Venom Swelling  . Timolol Nausea Only  . Brimonidine Nausea Only  .  Hydrocodone Itching and Rash    Past Medical History, Surgical history, Social history, and Family History were reviewed and updated.  Review of Systems: Review of Systems  Constitutional: Negative.   HENT:   Positive for voice change.   Eyes: Negative.   Respiratory: Negative.   Cardiovascular: Negative.   Gastrointestinal: Negative.   Endocrine: Negative.   Genitourinary: Negative.    Musculoskeletal: Negative.   Skin: Negative.   Neurological: Negative.   Hematological: Negative.   Psychiatric/Behavioral: Negative.     Physical Exam:  weight is 109 lb 8 oz (49.7 kg). Her oral temperature is 98.3 F (36.8 C). Her blood pressure is 130/83 and her pulse is 90. Her respiration is 20 and oxygen saturation is 99%.   Wt Readings from Last 3 Encounters:  09/14/19 109 lb 8 oz (49.7 kg)  08/03/19 108 lb (49 kg)  07/06/19 112 lb 3.2 oz (50.9 kg)    Physical Exam Vitals reviewed.  HENT:     Head: Normocephalic and atraumatic.  Eyes:     Pupils: Pupils are equal, round, and reactive to light.  Cardiovascular:     Rate and Rhythm: Normal rate and regular rhythm.     Heart sounds: Normal heart sounds.  Pulmonary:     Effort: Pulmonary effort is normal.     Breath sounds: Normal breath sounds.  Abdominal:     General: Bowel sounds are normal.     Palpations: Abdomen is soft.  Musculoskeletal:        General: No tenderness or deformity. Normal range of motion.     Cervical back: Normal range of motion.  Lymphadenopathy:     Cervical: No cervical adenopathy.  Skin:    General: Skin is warm and dry.     Findings: No erythema or rash.  Neurological:     Mental Status: She is alert and oriented to person, place, and time.  Psychiatric:        Behavior: Behavior normal.        Thought Content: Thought content normal.        Judgment: Judgment normal.      Lab Results  Component Value Date   WBC 4.1 09/14/2019   HGB 11.6 (L) 09/14/2019   HCT 35.5 (L) 09/14/2019   MCV  90.3 09/14/2019   PLT 255 09/14/2019     Chemistry      Component Value Date/Time   NA 133 (L) 09/14/2019 1130   K 3.9 09/14/2019 1130   CL 98 09/14/2019 1130   CO2 28 09/14/2019 1130   BUN 17 09/14/2019 1130   CREATININE 0.44 09/14/2019 1130      Component Value Date/Time   CALCIUM 9.9 09/14/2019 1130   ALKPHOS  76 09/14/2019 1130   AST 8 (L) 09/14/2019 1130   ALT 13 09/14/2019 1130   BILITOT 0.4 09/14/2019 1130      Impression and Plan: [name] is a very nice 74 year old white female with locally advanced stage IIIa non-small cell carcinoma the right lower lung.  She thankfully does not have any positive lymph nodes by bronchoscopy.  Again, we will go ahead and proceed with immunotherapy.  We will start immunotherapy in about a week or so.  I think she will get this for 1 year.  We will plan for another PET scan probably in October.  I spent a good 40 minutes with [name].  I went over her scans.  I went over the lab work.  I explained the role of immunotherapy and side effects.  She has a very good understanding of this.  I will plan to see her back in about 4-5 weeks when she has her second cycle of Durvalumab.   [name], MD 8/3/202112:45 PM

## 2019-09-17 NOTE — Progress Notes (Signed)
  Radiation Oncology         [phone] ________________________________  Name: [name] MRN: [id number]  Date: 07/16/2019  DOB: 1945/11/19  End of Treatment Note  Diagnosis:  Lung cancer     Indication for treatment::  curative       Radiation treatment dates:   06/01/19 - 07/16/19  Site/dose:   The patient was treated to the disease within the right lung initially to a dose of 60 Gy using a 2 field, IMRT technique. The patient then received a cone down boost treatment for an additional 6 Gy. This yielded a final total dose of 66 Gy.   Narrative: The patient tolerated radiation treatment relatively well.   Moderate esophagitis.     Plan: The patient has completed radiation treatment. The patient will return to radiation oncology clinic for routine followup in one month. I advised the patient to call or return sooner if they have any questions or concerns related to their recovery or treatment. ________________________________  Jodelle Gross, M.D., Ph.D.

## 2019-09-20 ENCOUNTER — Encounter: Payer: Self-pay | Admitting: *Deleted

## 2019-09-20 DIAGNOSIS — H401131 Primary open-angle glaucoma, bilateral, mild stage: Secondary | ICD-10-CM | POA: Diagnosis not present

## 2019-09-20 NOTE — Progress Notes (Signed)
Patient will begin maintenance with immunotherapy. Appointment scheduled for 09/29/2019.  Oncology Nurse Navigator Documentation  Oncology Nurse Navigator Flowsheets 09/20/2019  Abnormal Finding Date -  Diagnosis Status -  Phase of Treatment -  Chemo/Radiation Concurrent Actual Start Date: -  Chemo/Radiation Concurrent Actual End Date: -  Navigator Follow Up Date: 09/29/2019  Navigator Follow Up Reason: Chemotherapy  Navigator Location [facility]  Referral Date to RadOnc/MedOnc -  Navigator Encounter Type Appt/Treatment Plan Review  Telephone -  Treatment Initiated Date -  Patient Visit Type MedOnc  Treatment Phase Post-Tx Follow-up  Barriers/Navigation Needs -  Education -  Interventions -  Acuity Level 2-Minimal Needs (1-2 Barriers Identified)  Referrals -  Coordination of Care -  Education Method -  Support Groups/Services Friends and Family  Time Spent with Patient 15

## 2019-09-29 ENCOUNTER — Encounter: Payer: Self-pay | Admitting: *Deleted

## 2019-09-29 ENCOUNTER — Other Ambulatory Visit: Payer: Self-pay | Admitting: *Deleted

## 2019-09-29 ENCOUNTER — Inpatient Hospital Stay: Payer: Medicare Other

## 2019-09-29 ENCOUNTER — Other Ambulatory Visit: Payer: Self-pay | Admitting: Hematology & Oncology

## 2019-09-29 ENCOUNTER — Other Ambulatory Visit: Payer: Self-pay

## 2019-09-29 DIAGNOSIS — C3490 Malignant neoplasm of unspecified part of unspecified bronchus or lung: Secondary | ICD-10-CM

## 2019-09-29 DIAGNOSIS — Z923 Personal history of irradiation: Secondary | ICD-10-CM | POA: Diagnosis not present

## 2019-09-29 DIAGNOSIS — C3491 Malignant neoplasm of unspecified part of right bronchus or lung: Secondary | ICD-10-CM

## 2019-09-29 DIAGNOSIS — C3431 Malignant neoplasm of lower lobe, right bronchus or lung: Secondary | ICD-10-CM | POA: Diagnosis not present

## 2019-09-29 DIAGNOSIS — Z9221 Personal history of antineoplastic chemotherapy: Secondary | ICD-10-CM | POA: Diagnosis not present

## 2019-09-29 DIAGNOSIS — Z5112 Encounter for antineoplastic immunotherapy: Secondary | ICD-10-CM | POA: Diagnosis not present

## 2019-09-29 LAB — CMP (CANCER CENTER ONLY)
ALT: 14 U/L (ref 0–44)
AST: 9 U/L — ABNORMAL LOW (ref 15–41)
Albumin: 4.2 g/dL (ref 3.5–5.0)
Alkaline Phosphatase: 75 U/L (ref 38–126)
Anion gap: 6 (ref 5–15)
BUN: 16 mg/dL (ref 8–23)
CO2: 27 mmol/L (ref 22–32)
Calcium: 9.8 mg/dL (ref 8.9–10.3)
Chloride: 100 mmol/L (ref 98–111)
Creatinine: 0.44 mg/dL (ref 0.44–1.00)
GFR, Est AFR Am: >60 mL/min (ref >60)
GFR, Estimated: >60 mL/min (ref >60)
Glucose, Bld: 98 mg/dL (ref 70–99)
Potassium: 3.9 mmol/L (ref 3.5–5.1)
Sodium: 133 mmol/L — ABNORMAL LOW (ref 135–145)
Total Bilirubin: 0.3 mg/dL (ref 0.3–1.2)
Total Protein: 6.8 g/dL (ref 6.5–8.1)

## 2019-09-29 LAB — CBC WITH DIFFERENTIAL (CANCER CENTER ONLY)
Abs Immature Granulocytes: 0.01 10*3/uL (ref 0.00–0.07)
Basophils Absolute: 0 10*3/uL (ref 0.0–0.1)
Basophils Relative: 0 %
Eosinophils Absolute: 0.1 10*3/uL (ref 0.0–0.5)
Eosinophils Relative: 4 %
HCT: 36.3 % (ref 36.0–46.0)
Hemoglobin: 12 g/dL (ref 12.0–15.0)
Immature Granulocytes: 0 %
Lymphocytes Relative: 19 %
Lymphs Abs: 0.7 10*3/uL (ref 0.7–4.0)
MCH: 30 pg (ref 26.0–34.0)
MCHC: 33.1 g/dL (ref 30.0–36.0)
MCV: 90.8 fL (ref 80.0–100.0)
Monocytes Absolute: 0.6 10*3/uL (ref 0.1–1.0)
Monocytes Relative: 15 %
Neutro Abs: 2.2 10*3/uL (ref 1.7–7.7)
Neutrophils Relative %: 62 %
Platelet Count: 204 10*3/uL (ref 150–400)
RBC: 4 MIL/uL (ref 3.87–5.11)
RDW: 15 % (ref 11.5–15.5)
WBC Count: 3.6 10*3/uL — ABNORMAL LOW (ref 4.0–10.5)
nRBC: 0 % (ref 0.0–0.2)

## 2019-09-29 MED ORDER — HEPARIN SOD (PORK) LOCK FLUSH 100 UNIT/ML IV SOLN
500.0000 [IU] | Freq: Once | INTRAVENOUS | Status: AC | PRN
  Administered 2019-09-29: 500 [IU]
  Filled 2019-09-29: qty 5

## 2019-09-29 MED ORDER — LIDOCAINE-PRILOCAINE 2.5-2.5 % EX CREA: TOPICAL_CREAM | CUTANEOUS | 3 refills | Status: DC

## 2019-09-29 MED ORDER — PROCHLORPERAZINE MALEATE 10 MG PO TABS: 10.0000 mg | ORAL_TABLET | Freq: Four times a day (QID) | ORAL | 1 refills | Status: DC | PRN

## 2019-09-29 MED ORDER — LORAZEPAM 0.5 MG PO TABS: 0.5000 mg | ORAL_TABLET | Freq: Four times a day (QID) | ORAL | 0 refills | Status: DC | PRN

## 2019-09-29 MED ORDER — SODIUM CHLORIDE 0.9 % IV SOLN
Freq: Once | INTRAVENOUS | Status: AC
  Filled 2019-09-29: qty 250

## 2019-09-29 MED ORDER — SODIUM CHLORIDE 0.9% FLUSH
10.0000 mL | INTRAVENOUS | Status: DC | PRN
  Administered 2019-09-29: 10 mL
  Filled 2019-09-29: qty 10

## 2019-09-29 MED ORDER — ONDANSETRON HCL 8 MG PO TABS: 8.0000 mg | ORAL_TABLET | Freq: Two times a day (BID) | ORAL | 1 refills | Status: DC | PRN

## 2019-09-29 MED ORDER — SODIUM CHLORIDE 0.9 % IV SOLN
1500.0000 mg | Freq: Once | INTRAVENOUS | Status: AC
  Administered 2019-09-29: 1500 mg via INTRAVENOUS
  Filled 2019-09-29: qty 30

## 2019-09-29 NOTE — Patient Instructions (Signed)
Implanted Port Insertion, Care After °This sheet gives you information about how to care for yourself after your procedure. Your health care provider may also give you more specific instructions. If you have problems or questions, contact your health care provider. °What can I expect after the procedure? °After the procedure, it is common to have: °· Discomfort at the port insertion site. °· Bruising on the skin over the port. This should improve over 3-4 days. °Follow these instructions at home: °Port care °· After your port is placed, you will get a manufacturer's information card. The card has information about your port. Keep this card with you at all times. °· Take care of the port as told by your health care provider. Ask your health care provider if you or a family member can get training for taking care of the port at home. A home health care nurse may also take care of the port. °· Make sure to remember what type of port you have. °Incision care ° °  ° °· Follow instructions from your health care provider about how to take care of your port insertion site. Make sure you: °? Wash your hands with soap and water before and after you change your bandage (dressing). If soap and water are not available, use hand sanitizer. °? Change your dressing as told by your health care provider. °? Leave stitches (sutures), skin glue, or adhesive strips in place. These skin closures may need to stay in place for 2 weeks or longer. If adhesive strip edges start to loosen and curl up, you may trim the loose edges. Do not remove adhesive strips completely unless your health care provider tells you to do that. °· Check your port insertion site every day for signs of infection. Check for: °? Redness, swelling, or pain. °? Fluid or blood. °? Warmth. °? Pus or a bad smell. °Activity °· Return to your normal activities as told by your health care provider. Ask your health care provider what activities are safe for you. °· Do not  lift anything that is heavier than 10 lb (4.5 kg), or the limit that you are told, until your health care provider says that it is safe. °General instructions °· Take over-the-counter and prescription medicines only as told by your health care provider. °· Do not take baths, swim, or use a hot tub until your health care provider approves. Ask your health care provider if you may take showers. You may only be allowed to take sponge baths. °· Do not drive for 24 hours if you were given a sedative during your procedure. °· Wear a medical alert bracelet in case of an emergency. This will tell any health care providers that you have a port. °· Keep all follow-up visits as told by your health care provider. This is important. °Contact a health care provider if: °· You cannot flush your port with saline as directed, or you cannot draw blood from the port. °· You have a fever or chills. °· You have redness, swelling, or pain around your port insertion site. °· You have fluid or blood coming from your port insertion site. °· Your port insertion site feels warm to the touch. °· You have pus or a bad smell coming from the port insertion site. °Get help right away if: °· You have chest pain or shortness of breath. °· You have bleeding from your port that you cannot control. °Summary °· Take care of the port as told by your health   care provider. Keep the manufacturer's information card with you at all times. °· Change your dressing as told by your health care provider. °· Contact a health care provider if you have a fever or chills or if you have redness, swelling, or pain around your port insertion site. °· Keep all follow-up visits as told by your health care provider. °This information is not intended to replace advice given to you by your health care provider. Make sure you discuss any questions you have with your health care provider. °Document Revised: 08/26/2017 Document Reviewed: 08/26/2017 °Elsevier Patient Education ©  2020 [employer] ° °

## 2019-09-29 NOTE — Patient Instructions (Signed)
[facility] Discharge Instructions for Patients Receiving Chemotherapy  Today you received the following chemotherapy agents Imfinzi  To help prevent nausea and vomiting after your treatment, we encourage you to take your nausea medication as prescribed by MD.   If you develop nausea and vomiting that is not controlled by your nausea medication, call the clinic.   BELOW ARE SYMPTOMS THAT SHOULD BE REPORTED IMMEDIATELY:  *FEVER GREATER THAN 100.5 F  *CHILLS WITH OR WITHOUT FEVER  NAUSEA AND VOMITING THAT IS NOT CONTROLLED WITH YOUR NAUSEA MEDICATION  *UNUSUAL SHORTNESS OF BREATH  *UNUSUAL BRUISING OR BLEEDING  TENDERNESS IN MOUTH AND THROAT WITH OR WITHOUT PRESENCE OF ULCERS  *URINARY PROBLEMS  *BOWEL PROBLEMS  UNUSUAL RASH Items with * indicate a potential emergency and should be followed up as soon as possible.  Feel free to call the clinic should you have any questions or concerns. The clinic phone number is [phone].  Please show the [facility] at check-in to the Emergency Department and triage nurse.  Durvalumab injection What is this medicine? DURVALUMAB (dur VAL ue mab) is a monoclonal antibody. It is used to treat urothelial cancer and lung cancer. This medicine may be used for other purposes; ask your health care provider or pharmacist if you have questions. COMMON BRAND NAME(S): IMFINZI What should I tell my health care provider before I take this medicine? They need to know if you have any of these conditions:  diabetes  immune system problems  infection  inflammatory bowel disease  kidney disease  liver disease  lung or breathing disease  lupus  organ transplant  stomach or intestine problems  thyroid disease  an unusual or allergic reaction to durvalumab, other medicines, foods, dyes, or preservatives  pregnant or trying to get pregnant  breast-feeding How should I use this medicine? This medicine is for  infusion into a vein. It is given by a health care professional in a hospital or clinic setting. A special MedGuide will be given to you before each treatment. Be sure to read this information carefully each time. Talk to your pediatrician regarding the use of this medicine in children. Special care may be needed. Overdosage: If you think you have taken too much of this medicine contact a poison control center or emergency room at once. NOTE: This medicine is only for you. Do not share this medicine with others. What if I miss a dose? It is important not to miss your dose. Call your doctor or health care professional if you are unable to keep an appointment. What may interact with this medicine? Interactions have not been studied. This list may not describe all possible interactions. Give your health care provider a list of all the medicines, herbs, non-prescription drugs, or dietary supplements you use. Also tell them if you smoke, drink alcohol, or use illegal drugs. Some items may interact with your medicine. What should I watch for while using this medicine? This drug may make you feel generally unwell. Continue your course of treatment even though you feel ill unless your doctor tells you to stop. You may need blood work done while you are taking this medicine. Do not become pregnant while taking this medicine or for 3 months after stopping it. Women should inform their doctor if they wish to become pregnant or think they might be pregnant. There is a potential for serious side effects to an unborn child. Talk to your health care professional or pharmacist for more information. Do  not breast-feed an infant while taking this medicine or for 3 months after stopping it. What side effects may I notice from receiving this medicine? Side effects that you should report to your doctor or health care professional as soon as possible:  allergic reactions like skin rash, itching or hives, swelling of the  face, lips, or tongue  black, tarry stools  bloody or watery diarrhea  breathing problems  change in emotions or moods  change in sex drive  changes in vision  chest pain or chest tightness  chills  confusion  cough  facial flushing  fever  headache  signs and symptoms of high blood sugar such as dizziness; dry mouth; dry skin; fruity breath; nausea; stomach pain; increased hunger or thirst; increased urination  signs and symptoms of liver injury like dark yellow or brown urine; general ill feeling or flu-like symptoms; light-colored stools; loss of appetite; nausea; right upper belly pain; unusually weak or tired; yellowing of the eyes or skin  stomach pain  trouble passing urine or change in the amount of urine  weight gain or weight loss Side effects that usually do not require medical attention (report these to your doctor or health care professional if they continue or are bothersome):  bone pain  constipation  loss of appetite  muscle pain  nausea  swelling of the ankles, feet, hands  tiredness This list may not describe all possible side effects. Call your doctor for medical advice about side effects. You may report side effects to FDA at 1-800-FDA-1088. Where should I keep my medicine? This drug is given in a hospital or clinic and will not be stored at home. NOTE: This sheet is a summary. It may not cover all possible information. If you have questions about this medicine, talk to your doctor, pharmacist, or health care provider.  2020 Elsevier/Gold Standard (2016-04-09 19:25:04)

## 2019-09-29 NOTE — Progress Notes (Signed)
Patient reviewed her AVS at home and saw that she is supposed to have a chemo card to keep with her in case of ED admission. Since patient is now on immunotherapy, I will mail patient a immunotherapy card for her to have.   She also asked about a covid booster. Informed her that Dr Marin Olp is encouraging all his patients on active treatment to receive the booster. Number for scheduling dose given to patient.

## 2019-09-29 NOTE — Progress Notes (Signed)
Patient here to begin maintenance therapy with Imfinzi. She states she is feeling much better after her break from chemo/radiation. Her cough is much improved. She has no complaints or questions. She is hopeful that she will tolerate her maintenance treatment. She knows to call with any questions or concerns.  Oncology Nurse Navigator Documentation  Oncology Nurse Navigator Flowsheets 09/29/2019  Abnormal Finding Date -  Diagnosis Status -  Phase of Treatment -  Chemo/Radiation Concurrent Actual Start Date: -  Chemo/Radiation Concurrent Actual End Date: -  Navigator Follow Up Date: 10/28/2019  Navigator Follow Up Reason: Follow-up Appointment;Chemotherapy  Navigator Location [facility]  Referral Date to RadOnc/MedOnc -  Navigator Encounter Type Treatment  Telephone -  Treatment Initiated Date -  Patient Visit Type MedOnc  Treatment Phase Active Tx  Barriers/Navigation Needs Coordination of Care;Education  Education -  Interventions Psycho-Social Support  Acuity Level 2-Minimal Needs (1-2 Barriers Identified)  Referrals -  Coordination of Care -  Education Method -  Support Groups/Services Friends and Family  Time Spent with Patient 30

## 2019-10-09 ENCOUNTER — Ambulatory Visit: Payer: Medicare Other | Attending: Internal Medicine

## 2019-10-09 DIAGNOSIS — Z23 Encounter for immunization: Secondary | ICD-10-CM

## 2019-10-09 NOTE — Progress Notes (Signed)
   Covid-19 Vaccination Clinic  Name:  [name]    MRN: [id number] DOB: 1945/09/30  10/09/2019  [name] was observed post Covid-19 immunization for 15 minutes without incident. She was provided with Vaccine Information Sheet and instruction to access the V-Safe system.   [name] was instructed to call 911 with any severe reactions post vaccine: Marland Kitchen Difficulty breathing  . Swelling of face and throat  . A fast heartbeat  . A bad rash all over body  . Dizziness and weakness

## 2019-10-13 DIAGNOSIS — Z0000 Encounter for general adult medical examination without abnormal findings: Secondary | ICD-10-CM | POA: Diagnosis not present

## 2019-10-13 DIAGNOSIS — E039 Hypothyroidism, unspecified: Secondary | ICD-10-CM | POA: Diagnosis not present

## 2019-10-13 DIAGNOSIS — J439 Emphysema, unspecified: Secondary | ICD-10-CM | POA: Diagnosis not present

## 2019-10-13 DIAGNOSIS — E559 Vitamin D deficiency, unspecified: Secondary | ICD-10-CM | POA: Diagnosis not present

## 2019-10-27 ENCOUNTER — Other Ambulatory Visit: Payer: Self-pay | Admitting: *Deleted

## 2019-10-27 DIAGNOSIS — C3491 Malignant neoplasm of unspecified part of right bronchus or lung: Secondary | ICD-10-CM

## 2019-10-27 DIAGNOSIS — E871 Hypo-osmolality and hyponatremia: Secondary | ICD-10-CM

## 2019-10-28 ENCOUNTER — Inpatient Hospital Stay: Payer: Medicare Other

## 2019-10-28 ENCOUNTER — Encounter: Payer: Self-pay | Admitting: *Deleted

## 2019-10-28 ENCOUNTER — Encounter: Payer: Self-pay | Admitting: Hematology & Oncology

## 2019-10-28 ENCOUNTER — Inpatient Hospital Stay: Payer: Medicare Other | Attending: Hematology

## 2019-10-28 ENCOUNTER — Inpatient Hospital Stay (HOSPITAL_BASED_OUTPATIENT_CLINIC_OR_DEPARTMENT_OTHER): Payer: Medicare Other | Admitting: Hematology & Oncology

## 2019-10-28 ENCOUNTER — Other Ambulatory Visit: Payer: Self-pay

## 2019-10-28 VITALS — BP 124/63 | HR 83 | Temp 97.0°F | Resp 17 | Ht 59.45 in | Wt 111.0 lb

## 2019-10-28 VITALS — BP 124/63 | HR 83 | Temp 97.7°F | Resp 17 | Wt 111.0 lb

## 2019-10-28 DIAGNOSIS — C3491 Malignant neoplasm of unspecified part of right bronchus or lung: Secondary | ICD-10-CM

## 2019-10-28 DIAGNOSIS — E871 Hypo-osmolality and hyponatremia: Secondary | ICD-10-CM

## 2019-10-28 DIAGNOSIS — Z5112 Encounter for antineoplastic immunotherapy: Secondary | ICD-10-CM | POA: Diagnosis not present

## 2019-10-28 DIAGNOSIS — E079 Disorder of thyroid, unspecified: Secondary | ICD-10-CM | POA: Diagnosis not present

## 2019-10-28 DIAGNOSIS — C3431 Malignant neoplasm of lower lobe, right bronchus or lung: Secondary | ICD-10-CM | POA: Diagnosis not present

## 2019-10-28 DIAGNOSIS — Z95828 Presence of other vascular implants and grafts: Secondary | ICD-10-CM

## 2019-10-28 LAB — CBC WITH DIFFERENTIAL (CANCER CENTER ONLY)
Abs Immature Granulocytes: 0.01 10*3/uL (ref 0.00–0.07)
Basophils Absolute: 0 10*3/uL (ref 0.0–0.1)
Basophils Relative: 0 %
Eosinophils Absolute: 0.1 10*3/uL (ref 0.0–0.5)
Eosinophils Relative: 2 %
HCT: 37.2 % (ref 36.0–46.0)
Hemoglobin: 12.3 g/dL (ref 12.0–15.0)
Immature Granulocytes: 0 %
Lymphocytes Relative: 16 %
Lymphs Abs: 0.7 10*3/uL (ref 0.7–4.0)
MCH: 29.6 pg (ref 26.0–34.0)
MCHC: 33.1 g/dL (ref 30.0–36.0)
MCV: 89.4 fL (ref 80.0–100.0)
Monocytes Absolute: 0.6 10*3/uL (ref 0.1–1.0)
Monocytes Relative: 12 %
Neutro Abs: 3.2 10*3/uL (ref 1.7–7.7)
Neutrophils Relative %: 70 %
Platelet Count: 195 10*3/uL (ref 150–400)
RBC: 4.16 MIL/uL (ref 3.87–5.11)
RDW: 14.3 % (ref 11.5–15.5)
WBC Count: 4.6 10*3/uL (ref 4.0–10.5)
nRBC: 0 % (ref 0.0–0.2)

## 2019-10-28 LAB — CMP (CANCER CENTER ONLY)
ALT: 15 U/L (ref 0–44)
AST: 9 U/L — ABNORMAL LOW (ref 15–41)
Albumin: 4.1 g/dL (ref 3.5–5.0)
Alkaline Phosphatase: 81 U/L (ref 38–126)
Anion gap: 6 (ref 5–15)
BUN: 16 mg/dL (ref 8–23)
CO2: 27 mmol/L (ref 22–32)
Calcium: 9.7 mg/dL (ref 8.9–10.3)
Chloride: 100 mmol/L (ref 98–111)
Creatinine: 0.52 mg/dL (ref 0.44–1.00)
GFR, Est AFR Am: >60 mL/min (ref >60)
GFR, Estimated: >60 mL/min (ref >60)
Glucose, Bld: 105 mg/dL — ABNORMAL HIGH (ref 70–99)
Potassium: 4.2 mmol/L (ref 3.5–5.1)
Sodium: 133 mmol/L — ABNORMAL LOW (ref 135–145)
Total Bilirubin: 0.4 mg/dL (ref 0.3–1.2)
Total Protein: 6.6 g/dL (ref 6.5–8.1)

## 2019-10-28 MED ORDER — SODIUM CHLORIDE 0.9% FLUSH
10.0000 mL | INTRAVENOUS | Status: DC | PRN
  Administered 2019-10-28: 10 mL
  Filled 2019-10-28: qty 10

## 2019-10-28 MED ORDER — SODIUM CHLORIDE 0.9 % IV SOLN
1500.0000 mg | Freq: Once | INTRAVENOUS | Status: AC
  Administered 2019-10-28: 1500 mg via INTRAVENOUS
  Filled 2019-10-28: qty 30

## 2019-10-28 MED ORDER — HEPARIN SOD (PORK) LOCK FLUSH 100 UNIT/ML IV SOLN
500.0000 [IU] | Freq: Once | INTRAVENOUS | Status: AC | PRN
  Administered 2019-10-28: 500 [IU]
  Filled 2019-10-28: qty 5

## 2019-10-28 MED ORDER — SODIUM CHLORIDE 0.9 % IV SOLN
Freq: Once | INTRAVENOUS | Status: AC
  Filled 2019-10-28: qty 250

## 2019-10-28 MED ORDER — SODIUM CHLORIDE 0.9% FLUSH
10.0000 mL | Freq: Once | INTRAVENOUS | Status: AC
  Administered 2019-10-28: 10 mL via INTRAVENOUS
  Filled 2019-10-28: qty 10

## 2019-10-28 NOTE — Progress Notes (Signed)
Hematology and Oncology Follow Up Visit  [name] [id number] 08/04/1945 74 y.o. 10/28/2019   Principle Diagnosis:   Stage IIIA (T4N0M0) squamous cell carcinoma the right lower lung  Current Therapy:    Patient completed definitive chemoradiation therapy with weekly carboplatinum /Taxol  -completed on 07/16/2019  Durvalumab -- maintenance -- s/p cycle #1     Interim History:  [name] is back for follow-up.  She is doing much better now.  She is happy that she is off chemotherapy.  She had of her cycles of Durvalumab and had no problems with this.  There is no diarrhea.  She had no nausea or vomiting.  Her hair is slowly starting to come back.  She is eating much better.  She is taking some supplements.  She has had no issues with pain.  There is no rashes.  She has had no swelling in the legs.  She had no headache.  She has had no increased cough or shortness of breath.  Overall, I would say performance status is ECOG 1.   Medications:  Current Outpatient Medications:    benzonatate (TESSALON) 100 MG capsule, Take 1 capsule (100 mg total) by mouth 3 (three) times daily as needed for cough., Disp: 20 capsule, Rfl: 0   chlorpheniramine-HYDROcodone (TUSSIONEX) 10-8 MG/5ML SUER, Take 5 mLs by mouth every 12 (twelve) hours as needed for cough., Disp: 250 mL, Rfl: 0   Cholecalciferol (VITAMIN D) 50 MCG (2000 UT) tablet, Take 2,000 Units by mouth daily with supper., Disp: , Rfl:    diphenhydrAMINE (BENADRYL) 25 MG tablet, Take 50 mg by mouth daily as needed (allergic reaction)., Disp: , Rfl:    dorzolamide (TRUSOPT) 2 % ophthalmic solution, Place 1 drop into both eyes 2 (two) times daily. , Disp: , Rfl:    famotidine (PEPCID) 10 MG tablet, Take 10 mg by mouth daily as needed (take with benadryl when stung)., Disp: , Rfl:    levothyroxine (SYNTHROID) 88 MCG tablet, Take 1 tablet (88 mcg total) by mouth See admin instructions. Take 44 mcg on Mon and Fri, Take 88 mcg on  Sun, Tues, Wed, Thurs, and Sat, Disp: 30 tablet, Rfl: 1   lidocaine (XYLOCAINE) 2 % solution, Use as directed 10 mLs in the mouth or throat every 6 (six) hours as needed for mouth pain. Swallow as needed Q6hr prn, Disp: 450 mL, Rfl: 0   lidocaine-prilocaine (EMLA) cream, Apply to affected area once, Disp: 30 g, Rfl: 3   LORazepam (ATIVAN) 0.5 MG tablet, Take 1 tablet (0.5 mg total) by mouth every 6 (six) hours as needed (Nausea or vomiting)., Disp: 30 tablet, Rfl: 0   ondansetron (ZOFRAN) 8 MG tablet, Take 1 tablet (8 mg total) by mouth 2 (two) times daily as needed (Nausea or vomiting)., Disp: 30 tablet, Rfl: 1   predniSONE (DELTASONE) 20 MG tablet, Take 3 pills daily x 5 days, then 2 pills daily x 5 days, then 1 pill x 3 days, then 1/2 pill x 5 days, Disp: 35 tablet, Rfl: 0   prochlorperazine (COMPAZINE) 10 MG tablet, Take 1 tablet (10 mg total) by mouth every 6 (six) hours as needed (Nausea or vomiting)., Disp: 30 tablet, Rfl: 1   sucralfate (CARAFATE) 1 g tablet, Take 1 tablet (1 g total) by mouth 4 (four) times daily -  with meals and at bedtime. As needed, Disp: 60 tablet, Rfl: 2   travoprost, benzalkonium, (TRAVATAN) 0.004 % ophthalmic solution, Place 1 drop into both eyes at bedtime.  ,  Disp: , Rfl:   Allergies:  Allergies  Allergen Reactions   Bee Venom Swelling   Timolol Nausea Only   Brimonidine Nausea Only   Hydrocodone Itching and Rash    Past Medical History, Surgical history, Social history, and Family History were reviewed and updated.  Review of Systems: Review of Systems  Constitutional: Negative.   HENT:   Positive for voice change.   Eyes: Negative.   Respiratory: Negative.   Cardiovascular: Negative.   Gastrointestinal: Negative.   Endocrine: Negative.   Genitourinary: Negative.    Musculoskeletal: Negative.   Skin: Negative.   Neurological: Negative.   Hematological: Negative.   Psychiatric/Behavioral: Negative.     Physical Exam:  height is 4'  11.45" (1.51 m) and weight is 111 lb (50.3 kg). Her oral temperature is 97 F (36.1 C) (abnormal). Her blood pressure is 124/63 and her pulse is 83. Her respiration is 17 and oxygen saturation is 98%.   Wt Readings from Last 3 Encounters:  10/28/19 111 lb (50.3 kg)  10/28/19 111 lb (50.3 kg)  09/14/19 109 lb 8 oz (49.7 kg)    Physical Exam Vitals reviewed.  HENT:     Head: Normocephalic and atraumatic.  Eyes:     Pupils: Pupils are equal, round, and reactive to light.  Cardiovascular:     Rate and Rhythm: Normal rate and regular rhythm.     Heart sounds: Normal heart sounds.  Pulmonary:     Effort: Pulmonary effort is normal.     Breath sounds: Normal breath sounds.  Abdominal:     General: Bowel sounds are normal.     Palpations: Abdomen is soft.  Musculoskeletal:        General: No tenderness or deformity. Normal range of motion.     Cervical back: Normal range of motion.  Lymphadenopathy:     Cervical: No cervical adenopathy.  Skin:    General: Skin is warm and dry.     Findings: No erythema or rash.  Neurological:     Mental Status: She is alert and oriented to person, place, and time.  Psychiatric:        Behavior: Behavior normal.        Thought Content: Thought content normal.        Judgment: Judgment normal.      Lab Results  Component Value Date   WBC 4.6 10/28/2019   HGB 12.3 10/28/2019   HCT 37.2 10/28/2019   MCV 89.4 10/28/2019   PLT 195 10/28/2019     Chemistry      Component Value Date/Time   NA 133 (L) 09/29/2019 1041   K 3.9 09/29/2019 1041   CL 100 09/29/2019 1041   CO2 27 09/29/2019 1041   BUN 16 09/29/2019 1041   CREATININE 0.44 09/29/2019 1041      Component Value Date/Time   CALCIUM 9.8 09/29/2019 1041   ALKPHOS 75 09/29/2019 1041   AST 9 (L) 09/29/2019 1041   ALT 14 09/29/2019 1041   BILITOT 0.3 09/29/2019 1041      Impression and Plan: [name] is a very nice 74 year old white female with locally advanced stage IIIa  non-small cell carcinoma the right lower lung.  She thankfully does not have any positive lymph nodes by bronchoscopy.  I am happy that she is doing quite well.  We will go ahead with her second cycle of durvalumab.  She will get this for 1 year.  I will have to think about a follow-up PET  scan sometime in October or November.     [name], MD 9/16/202111:46 AM

## 2019-10-28 NOTE — Progress Notes (Signed)
Oncology Nurse Navigator Documentation  Oncology Nurse Navigator Flowsheets 10/28/2019  Abnormal Finding Date -  Diagnosis Status -  Phase of Treatment -  Chemo/Radiation Concurrent Actual Start Date: -  Chemo/Radiation Concurrent Actual End Date: -  Navigator Follow Up Date: 11/18/2019  Navigator Follow Up Reason: Follow-up Appointment;Chemotherapy  Navigator Location [facility]  Referral Date to RadOnc/MedOnc -  Navigator Encounter Type Treatment;Appt/Treatment Plan Review  Telephone -  Treatment Initiated Date -  Patient Visit Type MedOnc  Treatment Phase Active Tx  Barriers/Navigation Needs Coordination of Care;Education  Education -  Interventions Psycho-Social Support  Acuity Level 2-Minimal Needs (1-2 Barriers Identified)  Referrals -  Coordination of Care -  Education Method -  Support Groups/Services Friends and Family  Time Spent with Patient 15

## 2019-10-28 NOTE — Patient Instructions (Signed)
[facility] Discharge Instructions for Patients Receiving Chemotherapy  Today you received the following chemotherapy agents Imfinzi  To help prevent nausea and vomiting after your treatment, we encourage you to take your nausea medication as prescribed by MD.   If you develop nausea and vomiting that is not controlled by your nausea medication, call the clinic.   BELOW ARE SYMPTOMS THAT SHOULD BE REPORTED IMMEDIATELY:  *FEVER GREATER THAN 100.5 F  *CHILLS WITH OR WITHOUT FEVER  NAUSEA AND VOMITING THAT IS NOT CONTROLLED WITH YOUR NAUSEA MEDICATION  *UNUSUAL SHORTNESS OF BREATH  *UNUSUAL BRUISING OR BLEEDING  TENDERNESS IN MOUTH AND THROAT WITH OR WITHOUT PRESENCE OF ULCERS  *URINARY PROBLEMS  *BOWEL PROBLEMS  UNUSUAL RASH Items with * indicate a potential emergency and should be followed up as soon as possible.  Feel free to call the clinic should you have any questions or concerns. The clinic phone number is [phone].  Please show the [place] at check-in to the Emergency Department and triage nurse.

## 2019-11-06 DIAGNOSIS — Z23 Encounter for immunization: Secondary | ICD-10-CM | POA: Diagnosis not present

## 2019-11-18 ENCOUNTER — Telehealth: Payer: Self-pay | Admitting: Hematology & Oncology

## 2019-11-18 ENCOUNTER — Encounter: Payer: Self-pay | Admitting: Hematology & Oncology

## 2019-11-18 ENCOUNTER — Inpatient Hospital Stay: Payer: Medicare Other

## 2019-11-18 ENCOUNTER — Encounter: Payer: Self-pay | Admitting: *Deleted

## 2019-11-18 ENCOUNTER — Inpatient Hospital Stay: Payer: Medicare Other | Attending: Hematology | Admitting: Hematology & Oncology

## 2019-11-18 ENCOUNTER — Other Ambulatory Visit: Payer: Self-pay

## 2019-11-18 VITALS — BP 143/66 | HR 78 | Temp 98.1°F | Resp 19

## 2019-11-18 VITALS — Wt 113.0 lb

## 2019-11-18 DIAGNOSIS — C3490 Malignant neoplasm of unspecified part of unspecified bronchus or lung: Secondary | ICD-10-CM | POA: Diagnosis not present

## 2019-11-18 DIAGNOSIS — Z5112 Encounter for antineoplastic immunotherapy: Secondary | ICD-10-CM | POA: Diagnosis not present

## 2019-11-18 DIAGNOSIS — E079 Disorder of thyroid, unspecified: Secondary | ICD-10-CM

## 2019-11-18 DIAGNOSIS — Z95828 Presence of other vascular implants and grafts: Secondary | ICD-10-CM

## 2019-11-18 DIAGNOSIS — Z79899 Other long term (current) drug therapy: Secondary | ICD-10-CM | POA: Insufficient documentation

## 2019-11-18 DIAGNOSIS — C3431 Malignant neoplasm of lower lobe, right bronchus or lung: Secondary | ICD-10-CM | POA: Insufficient documentation

## 2019-11-18 DIAGNOSIS — C3491 Malignant neoplasm of unspecified part of right bronchus or lung: Secondary | ICD-10-CM

## 2019-11-18 LAB — CMP (CANCER CENTER ONLY)
ALT: 14 U/L (ref 0–44)
AST: 9 U/L — ABNORMAL LOW (ref 15–41)
Albumin: 4.2 g/dL (ref 3.5–5.0)
Alkaline Phosphatase: 83 U/L (ref 38–126)
Anion gap: 8 (ref 5–15)
BUN: 17 mg/dL (ref 8–23)
CO2: 28 mmol/L (ref 22–32)
Calcium: 9.4 mg/dL (ref 8.9–10.3)
Chloride: 94 mmol/L — ABNORMAL LOW (ref 98–111)
Creatinine: 0.55 mg/dL (ref 0.44–1.00)
GFR, Estimated: >60 mL/min (ref >60)
Glucose, Bld: 108 mg/dL — ABNORMAL HIGH (ref 70–99)
Potassium: 4 mmol/L (ref 3.5–5.1)
Sodium: 130 mmol/L — ABNORMAL LOW (ref 135–145)
Total Bilirubin: 0.4 mg/dL (ref 0.3–1.2)
Total Protein: 6.7 g/dL (ref 6.5–8.1)

## 2019-11-18 LAB — TSH: TSH: 0.921 u[IU]/mL (ref 0.308–3.960)

## 2019-11-18 LAB — CBC WITH DIFFERENTIAL (CANCER CENTER ONLY)
Abs Immature Granulocytes: 0 10*3/uL (ref 0.00–0.07)
Basophils Absolute: 0 10*3/uL (ref 0.0–0.1)
Basophils Relative: 0 %
Eosinophils Absolute: 0.1 10*3/uL (ref 0.0–0.5)
Eosinophils Relative: 3 %
HCT: 37 % (ref 36.0–46.0)
Hemoglobin: 12.4 g/dL (ref 12.0–15.0)
Immature Granulocytes: 0 %
Lymphocytes Relative: 19 %
Lymphs Abs: 0.7 10*3/uL (ref 0.7–4.0)
MCH: 30 pg (ref 26.0–34.0)
MCHC: 33.5 g/dL (ref 30.0–36.0)
MCV: 89.6 fL (ref 80.0–100.0)
Monocytes Absolute: 0.5 10*3/uL (ref 0.1–1.0)
Monocytes Relative: 14 %
Neutro Abs: 2.4 10*3/uL (ref 1.7–7.7)
Neutrophils Relative %: 64 %
Platelet Count: 197 10*3/uL (ref 150–400)
RBC: 4.13 MIL/uL (ref 3.87–5.11)
RDW: 13.8 % (ref 11.5–15.5)
WBC Count: 3.8 10*3/uL — ABNORMAL LOW (ref 4.0–10.5)
nRBC: 0 % (ref 0.0–0.2)

## 2019-11-18 LAB — LACTATE DEHYDROGENASE: LDH: 155 U/L (ref 98–192)

## 2019-11-18 MED ORDER — HEPARIN SOD (PORK) LOCK FLUSH 100 UNIT/ML IV SOLN
500.0000 [IU] | Freq: Once | INTRAVENOUS | Status: AC | PRN
  Administered 2019-11-18: 500 [IU]
  Filled 2019-11-18: qty 5

## 2019-11-18 MED ORDER — SODIUM CHLORIDE 0.9% FLUSH
10.0000 mL | Freq: Once | INTRAVENOUS | Status: AC
  Administered 2019-11-18: 10 mL via INTRAVENOUS
  Filled 2019-11-18: qty 10

## 2019-11-18 MED ORDER — SODIUM CHLORIDE 0.9 % IV SOLN
1500.0000 mg | Freq: Once | INTRAVENOUS | Status: AC
  Administered 2019-11-18: 1500 mg via INTRAVENOUS
  Filled 2019-11-18: qty 30

## 2019-11-18 MED ORDER — SODIUM CHLORIDE 0.9% FLUSH
10.0000 mL | INTRAVENOUS | Status: DC | PRN
  Administered 2019-11-18: 10 mL
  Filled 2019-11-18: qty 10

## 2019-11-18 MED ORDER — SODIUM CHLORIDE 0.9 % IV SOLN
Freq: Once | INTRAVENOUS | Status: AC
  Filled 2019-11-18: qty 250

## 2019-11-18 NOTE — Patient Instructions (Signed)
[facility] Discharge Instructions for Patients Receiving Chemotherapy  Today you received the following chemotherapy agents Imfinzi  To help prevent nausea and vomiting after your treatment, we encourage you to take your nausea medication as prescribed by MD.   If you develop nausea and vomiting that is not controlled by your nausea medication, call the clinic.   BELOW ARE SYMPTOMS THAT SHOULD BE REPORTED IMMEDIATELY:  *FEVER GREATER THAN 100.5 F  *CHILLS WITH OR WITHOUT FEVER  NAUSEA AND VOMITING THAT IS NOT CONTROLLED WITH YOUR NAUSEA MEDICATION  *UNUSUAL SHORTNESS OF BREATH  *UNUSUAL BRUISING OR BLEEDING  TENDERNESS IN MOUTH AND THROAT WITH OR WITHOUT PRESENCE OF ULCERS  *URINARY PROBLEMS  *BOWEL PROBLEMS  UNUSUAL RASH Items with * indicate a potential emergency and should be followed up as soon as possible.  Feel free to call the clinic should you have any questions or concerns. The clinic phone number is [phone].  Please show the [place] at check-in to the Emergency Department and triage nurse.

## 2019-11-18 NOTE — Progress Notes (Signed)
Hematology and Oncology Follow Up Visit  LAURA-LEE VILLEGAS [id number] [birth year] 74 y.o. 11/18/2019   Principle Diagnosis:   Stage IIIA (T4N0M0) squamous cell carcinoma the right lower lung  Current Therapy:    Patient completed definitive chemoradiation therapy with weekly carboplatinum /Taxol  -completed on 07/16/2019  Durvalumab -- maintenance -- s/p cycle #2     Interim History:  Ms. Cornacchia is back for follow-up.  She is doing much better now.  She is eating a little better.  She is still doing some boost.  There is been no problems with cough or increased shortness of breath.  She has had no diarrhea.  There has been no rashes.  There is been no leg swelling.  She has had no nausea or vomiting.  She is getting ready to go to the [place] this weekend.  She is looking forward to this.  Overall, her performance status is ECOG 1.     Medications:  Current Outpatient Medications:  .  benzonatate (TESSALON) 100 MG capsule, Take 1 capsule (100 mg total) by mouth 3 (three) times daily as needed for cough., Disp: 20 capsule, Rfl: 0 .  chlorpheniramine-HYDROcodone (TUSSIONEX) 10-8 MG/5ML SUER, Take 5 mLs by mouth every 12 (twelve) hours as needed for cough., Disp: 250 mL, Rfl: 0 .  Cholecalciferol (VITAMIN D) 50 MCG (2000 UT) tablet, Take 2,000 Units by mouth daily with supper., Disp: , Rfl:  .  diphenhydrAMINE (BENADRYL) 25 MG tablet, Take 50 mg by mouth daily as needed (allergic reaction)., Disp: , Rfl:  .  dorzolamide (TRUSOPT) 2 % ophthalmic solution, Place 1 drop into both eyes 2 (two) times daily. , Disp: , Rfl:  .  famotidine (PEPCID) 10 MG tablet, Take 10 mg by mouth daily as needed (take with benadryl when stung)., Disp: , Rfl:  .  levothyroxine (SYNTHROID) 88 MCG tablet, Take 1 tablet (88 mcg total) by mouth See admin instructions. Take 44 mcg on Mon and Fri, Take 88 mcg on Sun, Tues, Wed, Thurs, and Sat, Disp: 30 tablet, Rfl: 1 .  lidocaine (XYLOCAINE) 2 % solution, Use  as directed 10 mLs in the mouth or throat every 6 (six) hours as needed for mouth pain. Swallow as needed Q6hr prn, Disp: 450 mL, Rfl: 0 .  lidocaine-prilocaine (EMLA) cream, Apply to affected area once, Disp: 30 g, Rfl: 3 .  LORazepam (ATIVAN) 0.5 MG tablet, Take 1 tablet (0.5 mg total) by mouth every 6 (six) hours as needed (Nausea or vomiting)., Disp: 30 tablet, Rfl: 0 .  ondansetron (ZOFRAN) 8 MG tablet, Take 1 tablet (8 mg total) by mouth 2 (two) times daily as needed (Nausea or vomiting)., Disp: 30 tablet, Rfl: 1 .  predniSONE (DELTASONE) 20 MG tablet, Take 3 pills daily x 5 days, then 2 pills daily x 5 days, then 1 pill x 3 days, then 1/2 pill x 5 days, Disp: 35 tablet, Rfl: 0 .  prochlorperazine (COMPAZINE) 10 MG tablet, Take 1 tablet (10 mg total) by mouth every 6 (six) hours as needed (Nausea or vomiting)., Disp: 30 tablet, Rfl: 1 .  sucralfate (CARAFATE) 1 g tablet, Take 1 tablet (1 g total) by mouth 4 (four) times daily -  with meals and at bedtime. As needed, Disp: 60 tablet, Rfl: 2 .  travoprost, benzalkonium, (TRAVATAN) 0.004 % ophthalmic solution, Place 1 drop into both eyes at bedtime.  , Disp: , Rfl:   Allergies:  Allergies  Allergen Reactions  . Bee Venom Swelling  .  Timolol Nausea Only  . Brimonidine Nausea Only  . Hydrocodone Itching and Rash    Past Medical History, Surgical history, Social history, and Family History were reviewed and updated.  Review of Systems: Review of Systems  Constitutional: Negative.   HENT:   Positive for voice change.   Eyes: Negative.   Respiratory: Negative.   Cardiovascular: Negative.   Gastrointestinal: Negative.   Endocrine: Negative.   Genitourinary: Negative.    Musculoskeletal: Negative.   Skin: Negative.   Neurological: Negative.   Hematological: Negative.   Psychiatric/Behavioral: Negative.     Physical Exam:  weight is 113 lb (51.3 kg).   Wt Readings from Last 3 Encounters:  11/18/19 113 lb (51.3 kg)  10/28/19 111  lb (50.3 kg)  10/28/19 111 lb (50.3 kg)    Physical Exam Vitals reviewed.  HENT:     Head: Normocephalic and atraumatic.  Eyes:     Pupils: Pupils are equal, round, and reactive to light.  Cardiovascular:     Rate and Rhythm: Normal rate and regular rhythm.     Heart sounds: Normal heart sounds.  Pulmonary:     Effort: Pulmonary effort is normal.     Breath sounds: Normal breath sounds.  Abdominal:     General: Bowel sounds are normal.     Palpations: Abdomen is soft.  Musculoskeletal:        General: No tenderness or deformity. Normal range of motion.     Cervical back: Normal range of motion.  Lymphadenopathy:     Cervical: No cervical adenopathy.  Skin:    General: Skin is warm and dry.     Findings: No erythema or rash.  Neurological:     Mental Status: She is alert and oriented to person, place, and time.  Psychiatric:        Behavior: Behavior normal.        Thought Content: Thought content normal.        Judgment: Judgment normal.      Lab Results  Component Value Date   WBC 3.8 (L) 11/18/2019   HGB 12.4 11/18/2019   HCT 37.0 11/18/2019   MCV 89.6 11/18/2019   PLT 197 11/18/2019     Chemistry      Component Value Date/Time   NA 130 (L) 11/18/2019 1000   K 4.0 11/18/2019 1000   CL 94 (L) 11/18/2019 1000   CO2 28 11/18/2019 1000   BUN 17 11/18/2019 1000   CREATININE 0.55 11/18/2019 1000      Component Value Date/Time   CALCIUM 9.4 11/18/2019 1000   ALKPHOS 83 11/18/2019 1000   AST 9 (L) 11/18/2019 1000   ALT 14 11/18/2019 1000   BILITOT 0.4 11/18/2019 1000      Impression and Plan: Ms. Howser is a very nice 74 year old white female with locally advanced stage IIIa non-small cell carcinoma the right lower lung.  She thankfully does not have any positive lymph nodes by bronchoscopy.  I am happy that she is doing quite well.  We will go ahead with her 3rd cycle of durvalumab.  She will receive Durvalumab  for 1 year.   [name],  MD 10/7/202110:53 AM

## 2019-11-18 NOTE — Progress Notes (Signed)
Oncology Nurse Navigator Documentation  Oncology Nurse Navigator Flowsheets 11/18/2019  Abnormal Finding Date -  Diagnosis Status -  Phase of Treatment -  Chemo/Radiation Concurrent Actual Start Date: -  Chemo/Radiation Concurrent Actual End Date: -  Navigator Follow Up Date: 12/16/2019  Navigator Follow Up Reason: Follow-up Appointment;Chemotherapy  Navigator Location [facility]  Referral Date to RadOnc/MedOnc -  Navigator Encounter Type Treatment;Appt/Treatment Plan Review  Telephone -  Treatment Initiated Date -  Patient Visit Type MedOnc  Treatment Phase Active Tx  Barriers/Navigation Needs Coordination of Care;Education  Education -  Interventions Psycho-Social Support  Acuity Level 2-Minimal Needs (1-2 Barriers Identified)  Referrals -  Coordination of Care -  Education Method -  Support Groups/Services Friends and Family  Time Spent with Patient 15

## 2019-11-18 NOTE — Telephone Encounter (Signed)
Appointments scheduled and I have printed calendar for patient per 10/7 los

## 2019-12-15 DIAGNOSIS — H401131 Primary open-angle glaucoma, bilateral, mild stage: Secondary | ICD-10-CM | POA: Diagnosis not present

## 2019-12-15 DIAGNOSIS — Z961 Presence of intraocular lens: Secondary | ICD-10-CM | POA: Diagnosis not present

## 2019-12-15 DIAGNOSIS — H43813 Vitreous degeneration, bilateral: Secondary | ICD-10-CM | POA: Diagnosis not present

## 2019-12-16 ENCOUNTER — Encounter: Payer: Self-pay | Admitting: Family

## 2019-12-16 ENCOUNTER — Inpatient Hospital Stay: Payer: Medicare Other

## 2019-12-16 ENCOUNTER — Other Ambulatory Visit: Payer: Self-pay

## 2019-12-16 ENCOUNTER — Encounter: Payer: Self-pay | Admitting: *Deleted

## 2019-12-16 ENCOUNTER — Inpatient Hospital Stay: Payer: Medicare Other | Attending: Hematology | Admitting: Family

## 2019-12-16 VITALS — BP 146/80 | HR 80 | Temp 98.1°F | Resp 20 | Ht 59.0 in | Wt 112.0 lb

## 2019-12-16 DIAGNOSIS — C3491 Malignant neoplasm of unspecified part of right bronchus or lung: Secondary | ICD-10-CM | POA: Diagnosis not present

## 2019-12-16 DIAGNOSIS — Z79899 Other long term (current) drug therapy: Secondary | ICD-10-CM | POA: Diagnosis not present

## 2019-12-16 DIAGNOSIS — E079 Disorder of thyroid, unspecified: Secondary | ICD-10-CM | POA: Diagnosis not present

## 2019-12-16 DIAGNOSIS — C3431 Malignant neoplasm of lower lobe, right bronchus or lung: Secondary | ICD-10-CM | POA: Diagnosis not present

## 2019-12-16 DIAGNOSIS — Z5112 Encounter for antineoplastic immunotherapy: Secondary | ICD-10-CM | POA: Insufficient documentation

## 2019-12-16 DIAGNOSIS — C3490 Malignant neoplasm of unspecified part of unspecified bronchus or lung: Secondary | ICD-10-CM

## 2019-12-16 LAB — CBC WITH DIFFERENTIAL (CANCER CENTER ONLY)
Abs Immature Granulocytes: 0.01 10*3/uL (ref 0.00–0.07)
Basophils Absolute: 0 10*3/uL (ref 0.0–0.1)
Basophils Relative: 0 %
Eosinophils Absolute: 0.1 10*3/uL (ref 0.0–0.5)
Eosinophils Relative: 2 %
HCT: 37.1 % (ref 36.0–46.0)
Hemoglobin: 12.3 g/dL (ref 12.0–15.0)
Immature Granulocytes: 0 %
Lymphocytes Relative: 19 %
Lymphs Abs: 0.7 10*3/uL (ref 0.7–4.0)
MCH: 29.7 pg (ref 26.0–34.0)
MCHC: 33.2 g/dL (ref 30.0–36.0)
MCV: 89.6 fL (ref 80.0–100.0)
Monocytes Absolute: 0.6 10*3/uL (ref 0.1–1.0)
Monocytes Relative: 15 %
Neutro Abs: 2.3 10*3/uL (ref 1.7–7.7)
Neutrophils Relative %: 64 %
Platelet Count: 188 10*3/uL (ref 150–400)
RBC: 4.14 MIL/uL (ref 3.87–5.11)
RDW: 13.4 % (ref 11.5–15.5)
WBC Count: 3.7 10*3/uL — ABNORMAL LOW (ref 4.0–10.5)
nRBC: 0 % (ref 0.0–0.2)

## 2019-12-16 LAB — CMP (CANCER CENTER ONLY)
ALT: 12 U/L (ref 0–44)
AST: 9 U/L — ABNORMAL LOW (ref 15–41)
Albumin: 4.3 g/dL (ref 3.5–5.0)
Alkaline Phosphatase: 81 U/L (ref 38–126)
Anion gap: 6 (ref 5–15)
BUN: 14 mg/dL (ref 8–23)
CO2: 27 mmol/L (ref 22–32)
Calcium: 9.5 mg/dL (ref 8.9–10.3)
Chloride: 97 mmol/L — ABNORMAL LOW (ref 98–111)
Creatinine: 0.5 mg/dL (ref 0.44–1.00)
GFR, Estimated: >60 mL/min (ref >60)
Glucose, Bld: 101 mg/dL — ABNORMAL HIGH (ref 70–99)
Potassium: 3.8 mmol/L (ref 3.5–5.1)
Sodium: 130 mmol/L — ABNORMAL LOW (ref 135–145)
Total Bilirubin: 0.4 mg/dL (ref 0.3–1.2)
Total Protein: 7 g/dL (ref 6.5–8.1)

## 2019-12-16 LAB — LACTATE DEHYDROGENASE: LDH: 135 U/L (ref 98–192)

## 2019-12-16 MED ORDER — SODIUM CHLORIDE 0.9 % IV SOLN
Freq: Once | INTRAVENOUS | Status: AC
  Filled 2019-12-16: qty 250

## 2019-12-16 MED ORDER — HEPARIN SOD (PORK) LOCK FLUSH 100 UNIT/ML IV SOLN
500.0000 [IU] | Freq: Once | INTRAVENOUS | Status: AC | PRN
  Administered 2019-12-16: 500 [IU]
  Filled 2019-12-16: qty 5

## 2019-12-16 MED ORDER — SODIUM CHLORIDE 0.9% FLUSH
10.0000 mL | INTRAVENOUS | Status: DC | PRN
  Administered 2019-12-16: 10 mL
  Filled 2019-12-16: qty 10

## 2019-12-16 MED ORDER — SODIUM CHLORIDE 0.9 % IV SOLN
1500.0000 mg | Freq: Once | INTRAVENOUS | Status: AC
  Administered 2019-12-16: 1500 mg via INTRAVENOUS
  Filled 2019-12-16: qty 30

## 2019-12-16 NOTE — Patient Instructions (Signed)
[facility] Discharge Instructions for Patients Receiving Chemotherapy  Today you received the following chemotherapy agents Imfinzi  To help prevent nausea and vomiting after your treatment, we encourage you to take your nausea medication as prescribed by MD.   If you develop nausea and vomiting that is not controlled by your nausea medication, call the clinic.   BELOW ARE SYMPTOMS THAT SHOULD BE REPORTED IMMEDIATELY:  *FEVER GREATER THAN 100.5 F  *CHILLS WITH OR WITHOUT FEVER  NAUSEA AND VOMITING THAT IS NOT CONTROLLED WITH YOUR NAUSEA MEDICATION  *UNUSUAL SHORTNESS OF BREATH  *UNUSUAL BRUISING OR BLEEDING  TENDERNESS IN MOUTH AND THROAT WITH OR WITHOUT PRESENCE OF ULCERS  *URINARY PROBLEMS  *BOWEL PROBLEMS  UNUSUAL RASH Items with * indicate a potential emergency and should be followed up as soon as possible.  Feel free to call the clinic should you have any questions or concerns. The clinic phone number is [phone].  Please show the [place] at check-in to the Emergency Department and triage nurse.

## 2019-12-16 NOTE — Patient Instructions (Signed)

## 2019-12-16 NOTE — Progress Notes (Signed)
Hematology and Oncology Follow Up Visit  [name] [id number] 1945-05-21 74 y.o. 12/16/2019   Principle Diagnosis:  Stage IIIA (T4N0M0) squamous cell carcinoma the right lower lung  Past Therapy: Patient completed definitive chemoradiation therapy with weekly carboplatinum /Taxol - completed on 07/16/2019  Current Therapy:        Durvalumab -- maintenance -- s/p cycle 3   Interim History:  [name] is here today for follow-up and treatment. She is doing quite well and has no complaints at this time.  She is tolerating maintenance Durvalumab nicely so far. No side effects doted.  She states that she now has a great appetite and is staying well hydrated. Her weight is stable at 112 lbs.  SOB has remained minimal/stable.  No fever, chills, n/v, cough, rash, dizziness, chest pain, palpitations, abdominal pain or changes in bowel or bladder habits.  She has not noted any blood loss. No abnormal bruising or petechiae.  No swelling, tenderness, numbness or tingling in her extremities.  No falls or syncopal episodes to report.   ECOG Performance Status: 1 - Symptomatic but completely ambulatory  Medications:  Allergies as of 12/16/2019      Reactions   Bee Venom Swelling   Timolol Nausea Only   Brimonidine Nausea Only   Hydrocodone Itching, Rash      Medication List       Accurate as of December 16, 2019 11:16 AM. If you have any questions, ask your nurse or doctor.        STOP taking these medications   benzonatate 100 MG capsule Commonly known as: TESSALON Stopped by: Laverna Peace, NP   chlorpheniramine-HYDROcodone 10-8 MG/5ML Suer Commonly known as: [place] by: Laverna Peace, NP   lidocaine 2 % solution Commonly known as: XYLOCAINE Stopped by: Laverna Peace, NP   predniSONE 20 MG tablet Commonly known as: DELTASONE Stopped by: Laverna Peace, NP     TAKE these medications   diphenhydrAMINE 25 MG tablet Commonly known as:  BENADRYL Take 50 mg by mouth daily as needed (allergic reaction).   dorzolamide 2 % ophthalmic solution Commonly known as: TRUSOPT Place 1 drop into both eyes 2 (two) times daily.   famotidine 10 MG tablet Commonly known as: PEPCID Take 10 mg by mouth daily as needed (take with benadryl when stung).   levothyroxine 88 MCG tablet Commonly known as: SYNTHROID Take 1 tablet (88 mcg total) by mouth See admin instructions. Take 44 mcg on Mon and Fri, Take 88 mcg on Sun, Tues, Wed, Thurs, and Sat   lidocaine-prilocaine cream Commonly known as: EMLA Apply to affected area once   LORazepam 0.5 MG tablet Commonly known as: Ativan Take 1 tablet (0.5 mg total) by mouth every 6 (six) hours as needed (Nausea or vomiting).   ondansetron 8 MG tablet Commonly known as: Zofran Take 1 tablet (8 mg total) by mouth 2 (two) times daily as needed (Nausea or vomiting).   prochlorperazine 10 MG tablet Commonly known as: COMPAZINE Take 1 tablet (10 mg total) by mouth every 6 (six) hours as needed (Nausea or vomiting).   sucralfate 1 g tablet Commonly known as: CARAFATE Take 1 tablet (1 g total) by mouth 4 (four) times daily -  with meals and at bedtime. As needed   travoprost (benzalkonium) 0.004 % ophthalmic solution Commonly known as: TRAVATAN Place 1 drop into both eyes at bedtime.   Vitamin D 50 MCG (2000 UT) tablet Take 2,000 Units by mouth daily with supper.  Allergies:  Allergies  Allergen Reactions  . Bee Venom Swelling  . Timolol Nausea Only  . Brimonidine Nausea Only  . Hydrocodone Itching and Rash    Past Medical History, Surgical history, Social history, and Family History were reviewed and updated.  Review of Systems: All other 10 point review of systems is negative.   Physical Exam:  height is 4\' 11"  (1.499 m) and weight is 112 lb 0.6 oz (50.8 kg). Her oral temperature is 98.1 F (36.7 C). Her blood pressure is 146/80 (abnormal) and her pulse is 80. Her  respiration is 20 and oxygen saturation is 97%.   Wt Readings from Last 3 Encounters:  12/16/19 112 lb 0.6 oz (50.8 kg)  11/18/19 113 lb (51.3 kg)  10/28/19 111 lb (50.3 kg)    Ocular: Sclerae unicteric, pupils equal, round and reactive to light Ear-nose-throat: Oropharynx clear, dentition fair Lymphatic: No cervical, supraclavicular or axillary adenopathy Lungs no rales or rhonchi, good excursion bilaterally Heart regular rate and rhythm, no murmur appreciated Abd soft, nontender, positive bowel sounds, no liver or spleen tip palpated on exam, no fluid wave MSK no focal spinal tenderness, no joint edema Neuro: non-focal, well-oriented, appropriate affect Breasts: Deferred   Lab Results  Component Value Date   WBC 3.7 (L) 12/16/2019   HGB 12.3 12/16/2019   HCT 37.1 12/16/2019   MCV 89.6 12/16/2019   PLT 188 12/16/2019   Lab Results  Component Value Date   FERRITIN 311 (H) 05/25/2019   IRON 22 (L) 05/25/2019   TIBC 288 05/25/2019   UIBC 265 05/25/2019   IRONPCTSAT 8 (L) 05/25/2019   Lab Results  Component Value Date   RBC 4.14 12/16/2019   No results found for: KPAFRELGTCHN, LAMBDASER, KAPLAMBRATIO No results found for: IGGSERUM, IGA, IGMSERUM No results found for: TOTALPROTELP, ALBUMINELP, A1GS, A2GS, BETS, BETA2SER, GAMS, MSPIKE, SPEI   Chemistry      Component Value Date/Time   NA 130 (L) 11/18/2019 1000   K 4.0 11/18/2019 1000   CL 94 (L) 11/18/2019 1000   CO2 28 11/18/2019 1000   BUN 17 11/18/2019 1000   CREATININE 0.55 11/18/2019 1000      Component Value Date/Time   CALCIUM 9.4 11/18/2019 1000   ALKPHOS 83 11/18/2019 1000   AST 9 (L) 11/18/2019 1000   ALT 14 11/18/2019 1000   BILITOT 0.4 11/18/2019 1000       Impression and Plan: [name] is a very nice 74 yo caucasian female with locally advanced stage IIIa non-small cell carcinoma the right lower lung.  She thankfully did not have any positive lymph nodes by bronchoscopy. She continues to do  well with maintenance Durvalumab. We will proceed with cycle 4 today as planned.  Follow-up in 1 month.  We will plan to repeat PET scan the last week of November.  She was encouraged to contact our office with any questions or concerns.    Laverna Peace, NP 11/4/202111:16 AM

## 2019-12-16 NOTE — Progress Notes (Signed)
Oncology Nurse Navigator Documentation  Oncology Nurse Navigator Flowsheets 12/16/2019  Abnormal Finding Date -  Diagnosis Status -  Phase of Treatment -  Chemo/Radiation Concurrent Actual Start Date: -  Chemo/Radiation Concurrent Actual End Date: -  Navigator Follow Up Date: -  Navigator Follow Up Reason: -  Navigation Complete Date: 12/16/2019  Post Navigation: Continue to Follow Patient? No  Reason Not Navigating Patient: Patient On Maintenance Chemotherapy  Navigator Location [facility]  Referral Date to RadOnc/MedOnc -  Navigator Encounter Type Treatment  Telephone -  Treatment Initiated Date -  Patient Visit Type MedOnc  Treatment Phase Active Tx  Barriers/Navigation Needs Coordination of Care;Education  Education -  Interventions Psycho-Social Support  Acuity Level 1-No Barriers  Referrals -  Coordination of Care -  Education Method -  Support Groups/Services Friends and Family  Time Spent with Patient 15

## 2019-12-16 NOTE — Progress Notes (Signed)
Pt discharged in no apparent distress. Pt left ambulatory without assistance. Pt aware of discharge instructions and verbalized understanding and had no further questions.  

## 2019-12-17 LAB — TSH: TSH: 2.309 u[IU]/mL (ref 0.308–3.960)

## 2019-12-20 ENCOUNTER — Telehealth: Payer: Self-pay | Admitting: Hematology & Oncology

## 2019-12-20 DIAGNOSIS — L219 Seborrheic dermatitis, unspecified: Secondary | ICD-10-CM | POA: Diagnosis not present

## 2019-12-20 DIAGNOSIS — L814 Other melanin hyperpigmentation: Secondary | ICD-10-CM | POA: Diagnosis not present

## 2019-12-20 DIAGNOSIS — L821 Other seborrheic keratosis: Secondary | ICD-10-CM | POA: Diagnosis not present

## 2019-12-20 DIAGNOSIS — D225 Melanocytic nevi of trunk: Secondary | ICD-10-CM | POA: Diagnosis not present

## 2019-12-20 NOTE — Telephone Encounter (Signed)
Appointments scheduled calendar printed & mailed per 11/4 los entered late 11/5

## 2020-01-04 ENCOUNTER — Other Ambulatory Visit: Payer: Self-pay | Admitting: *Deleted

## 2020-01-04 ENCOUNTER — Telehealth: Payer: Self-pay | Admitting: *Deleted

## 2020-01-04 ENCOUNTER — Other Ambulatory Visit: Payer: Self-pay

## 2020-01-04 ENCOUNTER — Ambulatory Visit (HOSPITAL_BASED_OUTPATIENT_CLINIC_OR_DEPARTMENT_OTHER)
Admission: RE | Admit: 2020-01-04 | Discharge: 2020-01-04 | Disposition: A | Discharge status: Home / Self Care | Payer: Medicare Other | Source: Ambulatory Visit | Attending: Hematology & Oncology | Admitting: Hematology & Oncology

## 2020-01-04 DIAGNOSIS — C3490 Malignant neoplasm of unspecified part of unspecified bronchus or lung: Secondary | ICD-10-CM | POA: Diagnosis not present

## 2020-01-04 DIAGNOSIS — R059 Cough, unspecified: Secondary | ICD-10-CM | POA: Diagnosis not present

## 2020-01-04 DIAGNOSIS — R918 Other nonspecific abnormal finding of lung field: Secondary | ICD-10-CM | POA: Diagnosis not present

## 2020-01-04 DIAGNOSIS — C3491 Malignant neoplasm of unspecified part of right bronchus or lung: Secondary | ICD-10-CM

## 2020-01-04 DIAGNOSIS — J189 Pneumonia, unspecified organism: Secondary | ICD-10-CM | POA: Diagnosis not present

## 2020-01-04 MED ORDER — AZITHROMYCIN 1 G PO PACK: 1.0000 g | PACK | Freq: Once | ORAL | 0 refills | Status: AC

## 2020-01-04 MED ORDER — METHYLPREDNISOLONE 4 MG PO TBPK: ORAL_TABLET | ORAL | 0 refills | Status: DC

## 2020-01-04 MED ORDER — HYDROCOD POLST-CPM POLST ER 10-8 MG/5ML PO SUER: 5.0000 mL | Freq: Two times a day (BID) | ORAL | 0 refills | Status: DC | PRN

## 2020-01-04 NOTE — Telephone Encounter (Signed)
Message received from patient c/o a dry cough and thinks that she cracked a rib from coughing so much.  Dr. Marin Olp notified.  Call placed back to patient and patient notified that Dr. Marin Olp would like for her to come in today for a CXR.  Patient states that she will come in now for CXR at [facility] and is appreciative of call back.

## 2020-01-10 ENCOUNTER — Telehealth: Payer: Self-pay | Admitting: *Deleted

## 2020-01-12 ENCOUNTER — Encounter: Payer: Self-pay | Admitting: Hematology & Oncology

## 2020-01-12 ENCOUNTER — Other Ambulatory Visit: Payer: Medicare Other

## 2020-01-12 ENCOUNTER — Ambulatory Visit: Payer: Medicare Other | Admitting: Hematology & Oncology

## 2020-01-12 ENCOUNTER — Other Ambulatory Visit: Payer: Self-pay

## 2020-01-12 ENCOUNTER — Inpatient Hospital Stay: Payer: Medicare Other

## 2020-01-12 ENCOUNTER — Inpatient Hospital Stay (HOSPITAL_BASED_OUTPATIENT_CLINIC_OR_DEPARTMENT_OTHER): Payer: Medicare Other | Admitting: Hematology & Oncology

## 2020-01-12 ENCOUNTER — Telehealth: Payer: Self-pay | Admitting: Hematology & Oncology

## 2020-01-12 ENCOUNTER — Inpatient Hospital Stay: Payer: Medicare Other | Attending: Hematology

## 2020-01-12 VITALS — BP 137/64 | HR 84 | Temp 98.0°F | Resp 20 | Wt 111.0 lb

## 2020-01-12 DIAGNOSIS — Z79899 Other long term (current) drug therapy: Secondary | ICD-10-CM | POA: Diagnosis not present

## 2020-01-12 DIAGNOSIS — Z87891 Personal history of nicotine dependence: Secondary | ICD-10-CM | POA: Insufficient documentation

## 2020-01-12 DIAGNOSIS — E079 Disorder of thyroid, unspecified: Secondary | ICD-10-CM

## 2020-01-12 DIAGNOSIS — J449 Chronic obstructive pulmonary disease, unspecified: Secondary | ICD-10-CM | POA: Insufficient documentation

## 2020-01-12 DIAGNOSIS — C3491 Malignant neoplasm of unspecified part of right bronchus or lung: Secondary | ICD-10-CM

## 2020-01-12 DIAGNOSIS — Z923 Personal history of irradiation: Secondary | ICD-10-CM | POA: Diagnosis not present

## 2020-01-12 DIAGNOSIS — Z5112 Encounter for antineoplastic immunotherapy: Secondary | ICD-10-CM | POA: Insufficient documentation

## 2020-01-12 DIAGNOSIS — C3431 Malignant neoplasm of lower lobe, right bronchus or lung: Secondary | ICD-10-CM | POA: Diagnosis not present

## 2020-01-12 LAB — CMP (CANCER CENTER ONLY)
ALT: 11 U/L (ref 0–44)
AST: 7 U/L — ABNORMAL LOW (ref 15–41)
Albumin: 4.1 g/dL (ref 3.5–5.0)
Alkaline Phosphatase: 68 U/L (ref 38–126)
Anion gap: 6 (ref 5–15)
BUN: 14 mg/dL (ref 8–23)
CO2: 27 mmol/L (ref 22–32)
Calcium: 9.6 mg/dL (ref 8.9–10.3)
Chloride: 94 mmol/L — ABNORMAL LOW (ref 98–111)
Creatinine: 0.52 mg/dL (ref 0.44–1.00)
GFR, Estimated: >60 mL/min (ref >60)
Glucose, Bld: 122 mg/dL — ABNORMAL HIGH (ref 70–99)
Potassium: 3.9 mmol/L (ref 3.5–5.1)
Sodium: 127 mmol/L — ABNORMAL LOW (ref 135–145)
Total Bilirubin: 0.4 mg/dL (ref 0.3–1.2)
Total Protein: 6.9 g/dL (ref 6.5–8.1)

## 2020-01-12 LAB — CBC WITH DIFFERENTIAL (CANCER CENTER ONLY)
Abs Immature Granulocytes: 0.02 10*3/uL (ref 0.00–0.07)
Basophils Absolute: 0 10*3/uL (ref 0.0–0.1)
Basophils Relative: 0 %
Eosinophils Absolute: 0.1 10*3/uL (ref 0.0–0.5)
Eosinophils Relative: 2 %
HCT: 39.8 % (ref 36.0–46.0)
Hemoglobin: 13.2 g/dL (ref 12.0–15.0)
Immature Granulocytes: 0 %
Lymphocytes Relative: 10 %
Lymphs Abs: 0.7 10*3/uL (ref 0.7–4.0)
MCH: 30.5 pg (ref 26.0–34.0)
MCHC: 33.2 g/dL (ref 30.0–36.0)
MCV: 91.9 fL (ref 80.0–100.0)
Monocytes Absolute: 0.6 10*3/uL (ref 0.1–1.0)
Monocytes Relative: 8 %
Neutro Abs: 5.6 10*3/uL (ref 1.7–7.7)
Neutrophils Relative %: 80 %
Platelet Count: 295 10*3/uL (ref 150–400)
RBC: 4.33 MIL/uL (ref 3.87–5.11)
RDW: 13 % (ref 11.5–15.5)
WBC Count: 7.1 10*3/uL (ref 4.0–10.5)
nRBC: 0 % (ref 0.0–0.2)

## 2020-01-12 LAB — LACTATE DEHYDROGENASE: LDH: 138 U/L (ref 98–192)

## 2020-01-12 MED ORDER — SODIUM CHLORIDE 0.9% FLUSH
10.0000 mL | INTRAVENOUS | Status: AC | PRN
  Administered 2020-01-12: 10 mL via INTRAVENOUS
  Filled 2020-01-12: qty 10

## 2020-01-12 MED ORDER — HEPARIN SOD (PORK) LOCK FLUSH 100 UNIT/ML IV SOLN
500.0000 [IU] | Freq: Once | INTRAVENOUS | Status: AC
  Administered 2020-01-12: 500 [IU] via INTRAVENOUS
  Filled 2020-01-12: qty 5

## 2020-01-12 NOTE — Progress Notes (Signed)
Hematology and Oncology Follow Up Visit  [name] [id number] 1945-06-08 74 y.o. 01/12/2020   Principle Diagnosis:  Stage IIIA (T4N0M0) squamous cell carcinoma the right lower lung  Past Therapy: Patient completed definitive chemoradiation therapy with weekly carboplatinum /Taxol - completed on 07/16/2019  Current Therapy:        Durvalumab -- maintenance -- s/p cycle #4   Interim History:  [name] is here today for follow-up and treatment.  Unfortunately, she has been having a lot of problems with cough.  We did do a chest x-ray on her a week or so ago.  She had patchy opacities in the right lower lobe.  I am unsure if this is from her radiation treatments or if this might be from the Durvalumab.  I realize that she has underlying COPD.  I have her on steroids.  This is helped.  We have her on antibiotics and cough medicine.  Again she says that the cough is 95% better.  She may have pulled a muscle in the right intercostal area.  She says that there is pain over the right lateral chest wall at about the sixth or seventh interspace.  She has a over-the-counter pain patch.  I am going to hold on the Durvalumab for right now.  Again I am not sure if this might be part of the problem.  We will hold on her treatment for least 2 weeks.  Otherwise, she seems to be doing okay.  She had a nice Thanksgiving.  Her appetite has been doing well.  She has had no nausea or vomiting.  Is been no fever.  She has had no bleeding.  She has had no hemoptysis.  There is been no change in bowel or bladder habits.  Currently, her performance status is ECOG 2.  Medications:  Allergies as of 01/12/2020      Reactions   Bee Venom Swelling   Timolol Nausea Only   Brimonidine Nausea Only   Hydrocodone Itching, Rash      Medication List       Accurate as of January 12, 2020 11:02 AM. If you have any questions, ask your nurse or doctor.        STOP taking these medications    methylPREDNISolone 4 MG Tbpk tablet Commonly known as: MEDROL DOSEPAK Stopped by: [name], MD     TAKE these medications   chlorpheniramine-HYDROcodone 10-8 MG/5ML Suer Commonly known as: TUSSIONEX Take 5 mLs by mouth every 12 (twelve) hours as needed for cough.   diphenhydrAMINE 25 MG tablet Commonly known as: BENADRYL Take 50 mg by mouth daily as needed (allergic reaction).   dorzolamide 2 % ophthalmic solution Commonly known as: TRUSOPT Place 1 drop into both eyes 2 (two) times daily.   famotidine 10 MG tablet Commonly known as: PEPCID Take 10 mg by mouth daily as needed (take with benadryl when stung).   levothyroxine 88 MCG tablet Commonly known as: SYNTHROID Take 1 tablet (88 mcg total) by mouth See admin instructions. Take 44 mcg on Mon and Fri, Take 88 mcg on Sun, Tues, Wed, Thurs, and Sat   lidocaine-prilocaine cream Commonly known as: EMLA Apply to affected area once   LORazepam 0.5 MG tablet Commonly known as: Ativan Take 1 tablet (0.5 mg total) by mouth every 6 (six) hours as needed (Nausea or vomiting).   ondansetron 8 MG tablet Commonly known as: Zofran Take 1 tablet (8 mg total) by mouth 2 (two) times daily as needed (  Nausea or vomiting).   prochlorperazine 10 MG tablet Commonly known as: COMPAZINE Take 1 tablet (10 mg total) by mouth every 6 (six) hours as needed (Nausea or vomiting).   sucralfate 1 g tablet Commonly known as: CARAFATE Take 1 tablet (1 g total) by mouth 4 (four) times daily -  with meals and at bedtime. As needed   travoprost (benzalkonium) 0.004 % ophthalmic solution Commonly known as: TRAVATAN Place 1 drop into both eyes at bedtime.   Vitamin D 50 MCG (2000 UT) tablet Take 2,000 Units by mouth daily with supper.       Allergies:  Allergies  Allergen Reactions  . Bee Venom Swelling  . Timolol Nausea Only  . Brimonidine Nausea Only  . Hydrocodone Itching and Rash    Past Medical History, Surgical history,  Social history, and Family History were reviewed and updated.  Review of Systems: Review of Systems  Constitutional: Positive for malaise/fatigue.  HENT: Negative.   Eyes: Negative.   Respiratory: Positive for cough and sputum production.   Cardiovascular: Negative.   Gastrointestinal: Negative.   Genitourinary: Negative.   Musculoskeletal: Negative.   Skin: Negative.   Neurological: Negative.   Endo/Heme/Allergies: Negative.   Psychiatric/Behavioral: Negative.      Physical Exam:  weight is 111 lb (50.3 kg). Her oral temperature is 98 F (36.7 C). Her blood pressure is 137/64 and her pulse is 84. Her respiration is 20 and oxygen saturation is 100%.   Wt Readings from Last 3 Encounters:  01/12/20 111 lb (50.3 kg)  12/16/19 112 lb 0.6 oz (50.8 kg)  11/18/19 113 lb (51.3 kg)    Physical Exam Vitals reviewed.  HENT:     Head: Normocephalic and atraumatic.  Eyes:     Pupils: Pupils are equal, round, and reactive to light.  Cardiovascular:     Rate and Rhythm: Normal rate and regular rhythm.     Heart sounds: Normal heart sounds.  Pulmonary:     Effort: Pulmonary effort is normal.     Breath sounds: Normal breath sounds.     Comments: Lungs show distant breath sounds bilaterally.  She has good air movement bilaterally.  I do not hear any wheezes.  She has no decreased sounds to percussion. Abdominal:     General: Bowel sounds are normal.     Palpations: Abdomen is soft.  Musculoskeletal:        General: No tenderness or deformity. Normal range of motion.     Cervical back: Normal range of motion.  Lymphadenopathy:     Cervical: No cervical adenopathy.  Skin:    General: Skin is warm and dry.     Findings: No erythema or rash.  Neurological:     Mental Status: She is alert and oriented to person, place, and time.  Psychiatric:        Behavior: Behavior normal.        Thought Content: Thought content normal.        Judgment: Judgment normal.       Lab Results   Component Value Date   WBC 7.1 01/12/2020   HGB 13.2 01/12/2020   HCT 39.8 01/12/2020   MCV 91.9 01/12/2020   PLT 295 01/12/2020   Lab Results  Component Value Date   FERRITIN 311 (H) 05/25/2019   IRON 22 (L) 05/25/2019   TIBC 288 05/25/2019   UIBC 265 05/25/2019   IRONPCTSAT 8 (L) 05/25/2019   Lab Results  Component Value Date   RBC 4.33  01/12/2020   No results found for: KPAFRELGTCHN, LAMBDASER, KAPLAMBRATIO No results found for: IGGSERUM, IGA, IGMSERUM No results found for: Ronnald Ramp, A1GS, A2GS, Violet Baldy, MSPIKE, SPEI   Chemistry      Component Value Date/Time   NA 130 (L) 12/16/2019 1104   K 3.8 12/16/2019 1104   CL 97 (L) 12/16/2019 1104   CO2 27 12/16/2019 1104   BUN 14 12/16/2019 1104   CREATININE 0.50 12/16/2019 1104      Component Value Date/Time   CALCIUM 9.5 12/16/2019 1104   ALKPHOS 81 12/16/2019 1104   AST 9 (L) 12/16/2019 1104   ALT 12 12/16/2019 1104   BILITOT 0.4 12/16/2019 1104       Impression and Plan: [name] is a very nice 74 yo caucasian female with locally advanced stage IIIa non-small cell carcinoma the right lower lung.    Chemotherapy and radiation therapy with a very nice response.  She now is on maintenance Durvalumab.  We are going to give her another couple weeks off from the [place].  I would like to see what her chest x-ray looks like when we see her back.  Hopefully, we will find that she continues to improve.  Again I realize that she has underlying COPD.    [name], MD 12/1/202111:02 AM

## 2020-01-12 NOTE — Addendum Note (Signed)
Addended by: Cottie Banda on: 01/12/2020 11:24 AM   Modules accepted: Orders, SmartSet

## 2020-01-12 NOTE — Patient Instructions (Signed)
Implanted Port Insertion, Care After °This sheet gives you information about how to care for yourself after your procedure. Your health care provider may also give you more specific instructions. If you have problems or questions, contact your health care provider. °What can I expect after the procedure? °After the procedure, it is common to have: °· Discomfort at the port insertion site. °· Bruising on the skin over the port. This should improve over 3-4 days. °Follow these instructions at home: °Port care °· After your port is placed, you will get a manufacturer's information card. The card has information about your port. Keep this card with you at all times. °· Take care of the port as told by your health care provider. Ask your health care provider if you or a family member can get training for taking care of the port at home. A home health care nurse may also take care of the port. °· Make sure to remember what type of port you have. °Incision care ° °  ° °· Follow instructions from your health care provider about how to take care of your port insertion site. Make sure you: °? Wash your hands with soap and water before and after you change your bandage (dressing). If soap and water are not available, use hand sanitizer. °? Change your dressing as told by your health care provider. °? Leave stitches (sutures), skin glue, or adhesive strips in place. These skin closures may need to stay in place for 2 weeks or longer. If adhesive strip edges start to loosen and curl up, you may trim the loose edges. Do not remove adhesive strips completely unless your health care provider tells you to do that. °· Check your port insertion site every day for signs of infection. Check for: °? Redness, swelling, or pain. °? Fluid or blood. °? Warmth. °? Pus or a bad smell. °Activity °· Return to your normal activities as told by your health care provider. Ask your health care provider what activities are safe for you. °· Do not  lift anything that is heavier than 10 lb (4.5 kg), or the limit that you are told, until your health care provider says that it is safe. °General instructions °· Take over-the-counter and prescription medicines only as told by your health care provider. °· Do not take baths, swim, or use a hot tub until your health care provider approves. Ask your health care provider if you may take showers. You may only be allowed to take sponge baths. °· Do not drive for 24 hours if you were given a sedative during your procedure. °· Wear a medical alert bracelet in case of an emergency. This will tell any health care providers that you have a port. °· Keep all follow-up visits as told by your health care provider. This is important. °Contact a health care provider if: °· You cannot flush your port with saline as directed, or you cannot draw blood from the port. °· You have a fever or chills. °· You have redness, swelling, or pain around your port insertion site. °· You have fluid or blood coming from your port insertion site. °· Your port insertion site feels warm to the touch. °· You have pus or a bad smell coming from the port insertion site. °Get help right away if: °· You have chest pain or shortness of breath. °· You have bleeding from your port that you cannot control. °Summary °· Take care of the port as told by your health   care provider. Keep the manufacturer's information card with you at all times. °· Change your dressing as told by your health care provider. °· Contact a health care provider if you have a fever or chills or if you have redness, swelling, or pain around your port insertion site. °· Keep all follow-up visits as told by your health care provider. °This information is not intended to replace advice given to you by your health care provider. Make sure you discuss any questions you have with your health care provider. °Document Revised: 08/26/2017 Document Reviewed: 08/26/2017 °Elsevier Patient Education ©  2020 [employer] ° °

## 2020-01-12 NOTE — Telephone Encounter (Signed)
Appointments scheduled calendar printed per 12/1 los

## 2020-01-13 LAB — TSH: TSH: 4.17 u[IU]/mL — ABNORMAL HIGH (ref 0.308–3.960)

## 2020-01-26 ENCOUNTER — Inpatient Hospital Stay: Payer: Medicare Other

## 2020-01-26 ENCOUNTER — Encounter: Payer: Self-pay | Admitting: Hematology & Oncology

## 2020-01-26 ENCOUNTER — Inpatient Hospital Stay (HOSPITAL_BASED_OUTPATIENT_CLINIC_OR_DEPARTMENT_OTHER): Payer: Medicare Other | Admitting: Hematology & Oncology

## 2020-01-26 ENCOUNTER — Ambulatory Visit (HOSPITAL_BASED_OUTPATIENT_CLINIC_OR_DEPARTMENT_OTHER)
Admission: RE | Admit: 2020-01-26 | Discharge: 2020-01-26 | Disposition: A | Discharge status: Home / Self Care | Payer: Medicare Other | Source: Ambulatory Visit | Attending: Hematology & Oncology | Admitting: Hematology & Oncology

## 2020-01-26 ENCOUNTER — Telehealth: Payer: Self-pay

## 2020-01-26 ENCOUNTER — Other Ambulatory Visit: Payer: Self-pay

## 2020-01-26 VITALS — BP 138/69 | HR 88 | Temp 98.5°F | Resp 17 | Wt 108.0 lb

## 2020-01-26 DIAGNOSIS — C3491 Malignant neoplasm of unspecified part of right bronchus or lung: Secondary | ICD-10-CM

## 2020-01-26 DIAGNOSIS — Z87891 Personal history of nicotine dependence: Secondary | ICD-10-CM | POA: Diagnosis not present

## 2020-01-26 DIAGNOSIS — Z5112 Encounter for antineoplastic immunotherapy: Secondary | ICD-10-CM | POA: Diagnosis not present

## 2020-01-26 DIAGNOSIS — J449 Chronic obstructive pulmonary disease, unspecified: Secondary | ICD-10-CM | POA: Diagnosis not present

## 2020-01-26 DIAGNOSIS — Z923 Personal history of irradiation: Secondary | ICD-10-CM | POA: Diagnosis not present

## 2020-01-26 DIAGNOSIS — Z79899 Other long term (current) drug therapy: Secondary | ICD-10-CM | POA: Diagnosis not present

## 2020-01-26 DIAGNOSIS — C3431 Malignant neoplasm of lower lobe, right bronchus or lung: Secondary | ICD-10-CM | POA: Diagnosis not present

## 2020-01-26 DIAGNOSIS — R918 Other nonspecific abnormal finding of lung field: Secondary | ICD-10-CM | POA: Diagnosis not present

## 2020-01-26 LAB — CBC WITH DIFFERENTIAL (CANCER CENTER ONLY)
Abs Immature Granulocytes: 0.01 10*3/uL (ref 0.00–0.07)
Basophils Absolute: 0 10*3/uL (ref 0.0–0.1)
Basophils Relative: 1 %
Eosinophils Absolute: 0.1 10*3/uL (ref 0.0–0.5)
Eosinophils Relative: 3 %
HCT: 37.4 % (ref 36.0–46.0)
Hemoglobin: 12.7 g/dL (ref 12.0–15.0)
Immature Granulocytes: 0 %
Lymphocytes Relative: 18 %
Lymphs Abs: 0.6 10*3/uL — ABNORMAL LOW (ref 0.7–4.0)
MCH: 31 pg (ref 26.0–34.0)
MCHC: 34 g/dL (ref 30.0–36.0)
MCV: 91.2 fL (ref 80.0–100.0)
Monocytes Absolute: 0.5 10*3/uL (ref 0.1–1.0)
Monocytes Relative: 13 %
Neutro Abs: 2.3 10*3/uL (ref 1.7–7.7)
Neutrophils Relative %: 65 %
Platelet Count: 226 10*3/uL (ref 150–400)
RBC: 4.1 MIL/uL (ref 3.87–5.11)
RDW: 13.1 % (ref 11.5–15.5)
WBC Count: 3.5 10*3/uL — ABNORMAL LOW (ref 4.0–10.5)
nRBC: 0 % (ref 0.0–0.2)

## 2020-01-26 LAB — CMP (CANCER CENTER ONLY)
ALT: 9 U/L (ref 0–44)
AST: 7 U/L — ABNORMAL LOW (ref 15–41)
Albumin: 4.4 g/dL (ref 3.5–5.0)
Alkaline Phosphatase: 70 U/L (ref 38–126)
Anion gap: 7 (ref 5–15)
BUN: 13 mg/dL (ref 8–23)
CO2: 27 mmol/L (ref 22–32)
Calcium: 10 mg/dL (ref 8.9–10.3)
Chloride: 96 mmol/L — ABNORMAL LOW (ref 98–111)
Creatinine: 0.53 mg/dL (ref 0.44–1.00)
GFR, Estimated: >60 mL/min (ref >60)
Glucose, Bld: 82 mg/dL (ref 70–99)
Potassium: 3.9 mmol/L (ref 3.5–5.1)
Sodium: 130 mmol/L — ABNORMAL LOW (ref 135–145)
Total Bilirubin: 0.4 mg/dL (ref 0.3–1.2)
Total Protein: 6.9 g/dL (ref 6.5–8.1)

## 2020-01-26 LAB — LACTATE DEHYDROGENASE: LDH: 142 U/L (ref 98–192)

## 2020-01-26 MED ORDER — SODIUM CHLORIDE 0.9 % IV SOLN
1500.0000 mg | Freq: Once | INTRAVENOUS | Status: AC
  Administered 2020-01-26: 1500 mg via INTRAVENOUS
  Filled 2020-01-26: qty 30

## 2020-01-26 MED ORDER — SODIUM CHLORIDE 0.9% FLUSH
10.0000 mL | INTRAVENOUS | Status: DC | PRN
  Administered 2020-01-26: 10 mL
  Filled 2020-01-26: qty 10

## 2020-01-26 MED ORDER — HEPARIN SOD (PORK) LOCK FLUSH 100 UNIT/ML IV SOLN
500.0000 [IU] | Freq: Once | INTRAVENOUS | Status: AC | PRN
  Administered 2020-01-26: 500 [IU]
  Filled 2020-01-26: qty 5

## 2020-01-26 MED ORDER — LIDOCAINE 5 % EX PTCH: 1.0000 | MEDICATED_PATCH | CUTANEOUS | 3 refills | Status: DC

## 2020-01-26 MED ORDER — SODIUM CHLORIDE 0.9 % IV SOLN
Freq: Once | INTRAVENOUS | Status: AC
  Filled 2020-01-26: qty 250

## 2020-01-26 NOTE — Patient Instructions (Signed)

## 2020-01-26 NOTE — Patient Instructions (Signed)
[facility] Discharge Instructions for Patients Receiving Chemotherapy  Today you received the following chemotherapy agents Imfinzi  To help prevent nausea and vomiting after your treatment, we encourage you to take your nausea medication as prescribed by MD.   If you develop nausea and vomiting that is not controlled by your nausea medication, call the clinic.   BELOW ARE SYMPTOMS THAT SHOULD BE REPORTED IMMEDIATELY:  *FEVER GREATER THAN 100.5 F  *CHILLS WITH OR WITHOUT FEVER  NAUSEA AND VOMITING THAT IS NOT CONTROLLED WITH YOUR NAUSEA MEDICATION  *UNUSUAL SHORTNESS OF BREATH  *UNUSUAL BRUISING OR BLEEDING  TENDERNESS IN MOUTH AND THROAT WITH OR WITHOUT PRESENCE OF ULCERS  *URINARY PROBLEMS  *BOWEL PROBLEMS  UNUSUAL RASH Items with * indicate a potential emergency and should be followed up as soon as possible.  Feel free to call the clinic should you have any questions or concerns. The clinic phone number is [phone].  Please show the [facility] at check-in to the Emergency Department and triage nurse.

## 2020-01-26 NOTE — Progress Notes (Signed)
Hematology and Oncology Follow Up Visit  [name] [id number] Nov 18, 1945 74 y.o. 01/26/2020   Principle Diagnosis:  Stage IIIA (T4N0M0) squamous cell carcinoma the right lower lung  Past Therapy: Patient completed definitive chemoradiation therapy with weekly carboplatinum /Taxol - completed on 07/16/2019  Current Therapy:        Durvalumab -- maintenance -- s/p cycle #4   Interim History:  [name] is here today for follow-up and treatment.  She is now feeling better.  The cold that she had is doing better.  She is on antibiotics.  She is on little bit of steroid.  She has underlying COPD.  She has had no problems with bleeding.  She has had no problems with hemoptysis.  She has had no nausea or vomiting.  Patient did feel tired last week.  She has some pain over on the right side.  I suspect this probably a little bit of pleurisy from her underlying malignancy.  She had a chest x-ray done today.  This did show the mass in the right lower lung.  I suspect this probably is mostly scar tissue.  She had no pleural effusion.  There is no obvious adenopathy.  We will try her on a Lidoderm patch to see this may help some of the discomfort that she has.  She has had no leg swelling.  Overall, I would say her performance status is ECOG 1.    Medications:  Allergies as of 01/26/2020      Reactions   Bee Venom Swelling   Timolol Nausea Only   Brimonidine Nausea Only   Hydrocodone Itching, Rash      Medication List       Accurate as of January 26, 2020 10:22 AM. If you have any questions, ask your nurse or doctor.        chlorpheniramine-HYDROcodone 10-8 MG/5ML Suer Commonly known as: TUSSIONEX Take 5 mLs by mouth every 12 (twelve) hours as needed for cough.   diphenhydrAMINE 25 MG tablet Commonly known as: BENADRYL Take 50 mg by mouth daily as needed (allergic reaction).   dorzolamide 2 % ophthalmic solution Commonly known as: TRUSOPT Place 1 drop into both  eyes 2 (two) times daily.   famotidine 10 MG tablet Commonly known as: PEPCID Take 10 mg by mouth daily as needed (take with benadryl when stung).   levothyroxine 88 MCG tablet Commonly known as: SYNTHROID Take 1 tablet (88 mcg total) by mouth See admin instructions. Take 44 mcg on Mon and Fri, Take 88 mcg on Sun, Tues, Wed, Thurs, and Sat   lidocaine-prilocaine cream Commonly known as: EMLA Apply to affected area once   LORazepam 0.5 MG tablet Commonly known as: Ativan Take 1 tablet (0.5 mg total) by mouth every 6 (six) hours as needed (Nausea or vomiting).   ondansetron 8 MG tablet Commonly known as: Zofran Take 1 tablet (8 mg total) by mouth 2 (two) times daily as needed (Nausea or vomiting).   polyethylene glycol 17 g packet Commonly known as: MIRALAX / GLYCOLAX Take 17 g by mouth daily.   prochlorperazine 10 MG tablet Commonly known as: COMPAZINE Take 1 tablet (10 mg total) by mouth every 6 (six) hours as needed (Nausea or vomiting).   sennosides-docusate sodium 8.6-50 MG tablet Commonly known as: SENOKOT-S Take 2 tablets by mouth daily.   sucralfate 1 g tablet Commonly known as: CARAFATE Take 1 tablet (1 g total) by mouth 4 (four) times daily -  with meals and at bedtime.  As needed   travoprost (benzalkonium) 0.004 % ophthalmic solution Commonly known as: TRAVATAN Place 1 drop into both eyes at bedtime.   Vitamin D 50 MCG (2000 UT) tablet Take 2,000 Units by mouth daily with supper.       Allergies:  Allergies  Allergen Reactions  . Bee Venom Swelling  . Timolol Nausea Only  . Brimonidine Nausea Only  . Hydrocodone Itching and Rash    Past Medical History, Surgical history, Social history, and Family History were reviewed and updated.  Review of Systems: Review of Systems  Constitutional: Positive for malaise/fatigue.  HENT: Negative.   Eyes: Negative.   Respiratory: Positive for cough and sputum production.   Cardiovascular: Negative.    Gastrointestinal: Negative.   Genitourinary: Negative.   Musculoskeletal: Negative.   Skin: Negative.   Neurological: Negative.   Endo/Heme/Allergies: Negative.   Psychiatric/Behavioral: Negative.      Physical Exam:  weight is 108 lb (49 kg). Her oral temperature is 98.5 F (36.9 C). Her blood pressure is 138/69 and her pulse is 88. Her respiration is 17 and oxygen saturation is 100%.   Wt Readings from Last 3 Encounters:  01/26/20 108 lb (49 kg)  01/12/20 111 lb (50.3 kg)  12/16/19 112 lb 0.6 oz (50.8 kg)  Maybe weekly for labs  Physical Exam Vitals reviewed.  HENT:     Head: Normocephalic and atraumatic.  Eyes:     Pupils: Pupils are equal, round, and reactive to light.  Cardiovascular:     Rate and Rhythm: Normal rate and regular rhythm.     Heart sounds: Normal heart sounds.  Pulmonary:     Effort: Pulmonary effort is normal.     Breath sounds: Normal breath sounds.     Comments: Lungs show distant breath sounds bilaterally.  She has good air movement bilaterally.  I do not hear any wheezes.  She has no decreased sounds to percussion. Abdominal:     General: Bowel sounds are normal.     Palpations: Abdomen is soft.  Musculoskeletal:        General: No tenderness or deformity. Normal range of motion.     Cervical back: Normal range of motion.  Lymphadenopathy:     Cervical: No cervical adenopathy.  Skin:    General: Skin is warm and dry.     Findings: No erythema or rash.  Neurological:     Mental Status: She is alert and oriented to person, place, and time.  Psychiatric:        Behavior: Behavior normal.        Thought Content: Thought content normal.        Judgment: Judgment normal.       Lab Results  Component Value Date   WBC 3.5 (L) 01/26/2020   HGB 12.7 01/26/2020   HCT 37.4 01/26/2020   MCV 91.2 01/26/2020   PLT 226 01/26/2020   Lab Results  Component Value Date   FERRITIN 311 (H) 05/25/2019   IRON 22 (L) 05/25/2019   TIBC 288 05/25/2019    UIBC 265 05/25/2019   IRONPCTSAT 8 (L) 05/25/2019   Lab Results  Component Value Date   RBC 4.10 01/26/2020   No results found for: KPAFRELGTCHN, LAMBDASER, KAPLAMBRATIO No results found for: IGGSERUM, IGA, IGMSERUM No results found for: TOTALPROTELP, ALBUMINELP, A1GS, A2GS, BETS, BETA2SER, GAMS, MSPIKE, SPEI   Chemistry      Component Value Date/Time   NA 130 (L) 01/26/2020 0920   K 3.9 01/26/2020 0920  CL 96 (L) 01/26/2020 0920   CO2 27 01/26/2020 0920   BUN 13 01/26/2020 0920   CREATININE 0.53 01/26/2020 0920      Component Value Date/Time   CALCIUM 10.0 01/26/2020 0920   ALKPHOS 70 01/26/2020 0920   AST 7 (L) 01/26/2020 0920   ALT 9 01/26/2020 0920   BILITOT 0.4 01/26/2020 0920       Impression and Plan: Ms. Mcneff is a very nice 74 yo caucasian female with locally advanced stage IIIa non-small cell carcinoma the right lower lung.    Chemotherapy and radiation therapy with a very nice response.  She now is on maintenance Durvalumab.  We are going to go ahead and do her Durvalumab today.  I think this would be okay to do.  We will get her back to see [place] in another 4 weeks.  At some point, we may need to doing another scan on her to see how everything looks with respect to that cancer.    [name], MD 12/15/202110:22 AM

## 2020-01-26 NOTE — Telephone Encounter (Signed)
appts made and printed for pt per 01/26/20 los... AOM

## 2020-02-08 ENCOUNTER — Other Ambulatory Visit: Payer: Self-pay | Admitting: *Deleted

## 2020-02-08 DIAGNOSIS — D701 Agranulocytosis secondary to cancer chemotherapy: Secondary | ICD-10-CM

## 2020-02-08 DIAGNOSIS — C3491 Malignant neoplasm of unspecified part of right bronchus or lung: Secondary | ICD-10-CM

## 2020-02-08 DIAGNOSIS — T451X5A Adverse effect of antineoplastic and immunosuppressive drugs, initial encounter: Secondary | ICD-10-CM

## 2020-02-08 MED ORDER — AMOXICILLIN 500 MG PO CAPS: ORAL_CAPSULE | ORAL | 0 refills | Status: DC

## 2020-02-09 ENCOUNTER — Other Ambulatory Visit: Payer: Self-pay

## 2020-02-09 ENCOUNTER — Ambulatory Visit (HOSPITAL_COMMUNITY)
Admission: RE | Admit: 2020-02-09 | Discharge: 2020-02-09 | Disposition: A | Discharge status: Home / Self Care | Payer: Medicare Other | Source: Ambulatory Visit | Attending: Hematology & Oncology | Admitting: Hematology & Oncology

## 2020-02-09 ENCOUNTER — Encounter: Payer: Self-pay | Admitting: *Deleted

## 2020-02-09 DIAGNOSIS — I700 Atherosclerosis of aorta: Secondary | ICD-10-CM | POA: Diagnosis not present

## 2020-02-09 DIAGNOSIS — S2241XA Multiple fractures of ribs, right side, initial encounter for closed fracture: Secondary | ICD-10-CM | POA: Diagnosis not present

## 2020-02-09 DIAGNOSIS — Z923 Personal history of irradiation: Secondary | ICD-10-CM | POA: Insufficient documentation

## 2020-02-09 DIAGNOSIS — C3491 Malignant neoplasm of unspecified part of right bronchus or lung: Secondary | ICD-10-CM | POA: Diagnosis not present

## 2020-02-09 DIAGNOSIS — R918 Other nonspecific abnormal finding of lung field: Secondary | ICD-10-CM | POA: Diagnosis not present

## 2020-02-09 LAB — GLUCOSE, CAPILLARY: Glucose-Capillary: 95 mg/dL (ref 70–99)

## 2020-02-09 MED ORDER — FLUDEOXYGLUCOSE F - 18 (FDG) INJECTION
5.3600 | Freq: Once | INTRAVENOUS | Status: AC
  Administered 2020-02-09: 5.36 via INTRAVENOUS

## 2020-02-09 NOTE — Progress Notes (Signed)
Patient called and given the follow message  the PET scan shows that the lung cancer is mostly scar tissue now.!! This is what is supposed to happen after chemo and XRT!! Happy New Year!! [name]

## 2020-02-22 ENCOUNTER — Encounter: Payer: Self-pay | Admitting: *Deleted

## 2020-02-22 ENCOUNTER — Emergency Department (HOSPITAL_BASED_OUTPATIENT_CLINIC_OR_DEPARTMENT_OTHER): Payer: Medicare Other

## 2020-02-22 ENCOUNTER — Emergency Department (HOSPITAL_BASED_OUTPATIENT_CLINIC_OR_DEPARTMENT_OTHER)
Admission: EM | Admit: 2020-02-22 | Discharge: 2020-02-22 | Disposition: A | Discharge status: Home / Self Care | Payer: Medicare Other | Attending: Emergency Medicine | Admitting: Emergency Medicine

## 2020-02-22 ENCOUNTER — Encounter (HOSPITAL_BASED_OUTPATIENT_CLINIC_OR_DEPARTMENT_OTHER): Payer: Self-pay

## 2020-02-22 ENCOUNTER — Other Ambulatory Visit: Payer: Self-pay

## 2020-02-22 DIAGNOSIS — E039 Hypothyroidism, unspecified: Secondary | ICD-10-CM | POA: Diagnosis not present

## 2020-02-22 DIAGNOSIS — I6782 Cerebral ischemia: Secondary | ICD-10-CM | POA: Diagnosis not present

## 2020-02-22 DIAGNOSIS — R29810 Facial weakness: Secondary | ICD-10-CM | POA: Diagnosis present

## 2020-02-22 DIAGNOSIS — G510 Bell's palsy: Secondary | ICD-10-CM

## 2020-02-22 DIAGNOSIS — Z85118 Personal history of other malignant neoplasm of bronchus and lung: Secondary | ICD-10-CM | POA: Insufficient documentation

## 2020-02-22 DIAGNOSIS — Z79899 Other long term (current) drug therapy: Secondary | ICD-10-CM | POA: Diagnosis not present

## 2020-02-22 DIAGNOSIS — Z87891 Personal history of nicotine dependence: Secondary | ICD-10-CM | POA: Diagnosis not present

## 2020-02-22 DIAGNOSIS — R29818 Other symptoms and signs involving the nervous system: Secondary | ICD-10-CM | POA: Diagnosis not present

## 2020-02-22 LAB — DIFFERENTIAL
Abs Immature Granulocytes: 0.02 10*3/uL (ref 0.00–0.07)
Basophils Absolute: 0 10*3/uL (ref 0.0–0.1)
Basophils Relative: 1 %
Eosinophils Absolute: 0.2 10*3/uL (ref 0.0–0.5)
Eosinophils Relative: 3 %
Immature Granulocytes: 0 %
Lymphocytes Relative: 20 %
Lymphs Abs: 1.2 10*3/uL (ref 0.7–4.0)
Monocytes Absolute: 0.6 10*3/uL (ref 0.1–1.0)
Monocytes Relative: 10 %
Neutro Abs: 4.2 10*3/uL (ref 1.7–7.7)
Neutrophils Relative %: 66 %

## 2020-02-22 LAB — COMPREHENSIVE METABOLIC PANEL
ALT: 18 U/L (ref 0–44)
AST: 12 U/L — ABNORMAL LOW (ref 15–41)
Albumin: 4.7 g/dL (ref 3.5–5.0)
Alkaline Phosphatase: 78 U/L (ref 38–126)
Anion gap: 10 (ref 5–15)
BUN: 15 mg/dL (ref 8–23)
CO2: 26 mmol/L (ref 22–32)
Calcium: 9.7 mg/dL (ref 8.9–10.3)
Chloride: 93 mmol/L — ABNORMAL LOW (ref 98–111)
Creatinine, Ser: 0.43 mg/dL — ABNORMAL LOW (ref 0.44–1.00)
GFR, Estimated: >60 mL/min (ref >60)
Glucose, Bld: 101 mg/dL — ABNORMAL HIGH (ref 70–99)
Potassium: 3.8 mmol/L (ref 3.5–5.1)
Sodium: 129 mmol/L — ABNORMAL LOW (ref 135–145)
Total Bilirubin: 0.5 mg/dL (ref 0.3–1.2)
Total Protein: 7.8 g/dL (ref 6.5–8.1)

## 2020-02-22 LAB — PROTIME-INR
INR: 0.9 (ref 0.8–1.2)
Prothrombin Time: 12.2 seconds (ref 11.4–15.2)

## 2020-02-22 LAB — CBC
HCT: 40.9 % (ref 36.0–46.0)
Hemoglobin: 14 g/dL (ref 12.0–15.0)
MCH: 31.5 pg (ref 26.0–34.0)
MCHC: 34.2 g/dL (ref 30.0–36.0)
MCV: 92.1 fL (ref 80.0–100.0)
Platelets: 262 10*3/uL (ref 150–400)
RBC: 4.44 MIL/uL (ref 3.87–5.11)
RDW: 13.3 % (ref 11.5–15.5)
WBC: 6.2 10*3/uL (ref 4.0–10.5)
nRBC: 0 % (ref 0.0–0.2)

## 2020-02-22 LAB — CBG MONITORING, ED: Glucose-Capillary: 92 mg/dL (ref 70–99)

## 2020-02-22 LAB — APTT: aPTT: 25 seconds (ref 24–36)

## 2020-02-22 MED ORDER — PREDNISONE 50 MG PO TABS
60.0000 mg | ORAL_TABLET | Freq: Once | ORAL | Status: AC
  Administered 2020-02-22: 60 mg via ORAL
  Filled 2020-02-22: qty 1

## 2020-02-22 MED ORDER — VALACYCLOVIR HCL 1 G PO TABS: 1000.0000 mg | ORAL_TABLET | Freq: Three times a day (TID) | ORAL | 0 refills | Status: AC

## 2020-02-22 MED ORDER — PREDNISONE 20 MG PO TABS: 60.0000 mg | ORAL_TABLET | Freq: Every day | ORAL | 0 refills | Status: AC

## 2020-02-22 MED ORDER — VALACYCLOVIR HCL 500 MG PO TABS
1000.0000 mg | ORAL_TABLET | Freq: Once | ORAL | Status: AC
  Administered 2020-02-22: 1000 mg via ORAL
  Filled 2020-02-22: qty 2

## 2020-02-22 NOTE — ED Provider Notes (Signed)
[name] Provider Note   CSN: [id number] Arrival date & time: 02/22/20  1550     History Chief Complaint  Patient presents with  . Facial Droop    [name] is a 75 y.o. female w PMHx squamous cell carcinoma of the lung, hyponatremia, hypothyroidism, presenting to the emergency department complaint of right-sided facial droop that began around 12 to 1:00 PM yesterday afternoon.  She states she noticed that her right eye was more open than the left.  At dinnertime she noticed she was biting her cheek while eating.  Did not appreciate any taste difference.  She is not have any numbness to her face, nor weakness or numbness to any of her extremities.  She is having no difficulty walking or with speech.  She is having some blurry vision in the right eye though no visual field loss or double vision.  No headache.  The history is provided by the patient.       Past Medical History:  Diagnosis Date  . Dyspnea   . Glaucoma   . Headache   . Hypothyroidism   . Right lower lobe lung mass   . Thyroid condition     Patient Active Problem List   Diagnosis Date Noted  . Leukopenia due to antineoplastic chemotherapy ([place]) 06/22/2019  . Hyponatremia 05/25/2019  . Anemia due to antineoplastic chemotherapy 05/25/2019  . Squamous cell carcinoma lung ([facility]) 05/07/2019  . Thyroid condition     Past Surgical History:  Procedure Laterality Date  . BIOPSY  05/11/2019   Procedure: BIOPSY;  Surgeon: Laurin Coder, MD;  Location: [facility];  Service: Pulmonary;;  . BRONCHIAL WASHINGS  05/11/2019   Procedure: BRONCHIAL WASHINGS;  Surgeon: Laurin Coder, MD;  Location: [facility];  Service: Pulmonary;;  . CATARACT EXTRACTION, BILATERAL     1 was a lens implant  . COLONOSCOPY    . FINE NEEDLE ASPIRATION  05/11/2019   Procedure: FINE NEEDLE ASPIRATION (FNA) LINEAR;  Surgeon: Laurin Coder, MD;  Location: [facility];  Service: Pulmonary;;   . IR IMAGING GUIDED PORT INSERTION  05/27/2019  . MEDIASTINOSCOPY N/A 05/21/2019   Procedure: MEDIASTINOSCOPY;  Surgeon: Grace Isaac, MD;  Location: [facility];  Service: Thoracic;  Laterality: N/A;  . SHOULDER SURGERY    . VIDEO BRONCHOSCOPY WITH ENDOBRONCHIAL ULTRASOUND N/A 05/11/2019   Procedure: VIDEO BRONCHOSCOPY WITH ENDOBRONCHIAL ULTRASOUND;  Surgeon: Laurin Coder, MD;  Location: [facility];  Service: Pulmonary;  Laterality: N/A;  . VIDEO BRONCHOSCOPY WITH ENDOBRONCHIAL ULTRASOUND N/A 05/21/2019   Procedure: VIDEO BRONCHOSCOPY WITH ENDOBRONCHIAL ULTRASOUND;  Surgeon: Grace Isaac, MD;  Location: [facility];  Service: Thoracic;  Laterality: N/A;  . vocal cord polyp removal    . WISDOM TOOTH EXTRACTION       OB History   No obstetric history on file.     History reviewed. No pertinent family history.  Social History   Tobacco Use  . Smoking status: Former Smoker    Types: Cigarettes    Quit date: 02/13/2010    Years since quitting: 10.0  . Smokeless tobacco: Never Used  . Tobacco comment: Quit in 2011  Vaping Use  . Vaping Use: Former  . Devices: used 2-3 weeks while she was quitting cigarettes  Substance Use Topics  . Alcohol use: Yes    Comment: very rare, 1 glass of wine maybe every 3-4 weeks  . Drug use: No    Home Medications Prior to Admission  medications   Medication Sig Start Date End Date Taking? Authorizing Provider  predniSONE (DELTASONE) 20 MG tablet Take 3 tablets (60 mg total) by mouth daily for 5 days. 02/22/20 02/27/20 Yes Quentin Cornwall, [place] N, PA-C  valACYclovir (VALTREX) 1000 MG tablet Take 1 tablet (1,000 mg total) by mouth 3 (three) times daily for 7 days. 02/22/20 02/29/20 Yes Quentin Cornwall, [place] N, PA-C  amoxicillin (AMOXIL) 500 MG capsule Take 4 capsules (2000 mg) one hour prior to dental appointment. 02/08/20   [name], MD  chlorpheniramine-HYDROcodone (TUSSIONEX) 10-8 MG/5ML SUER Take 5 mLs by mouth every 12 (twelve) hours as needed for  cough. 01/04/20   [name], MD  Cholecalciferol (VITAMIN D) 50 MCG (2000 UT) tablet Take 2,000 Units by mouth daily with supper.    [provider]  diphenhydrAMINE (BENADRYL) 25 MG tablet Take 50 mg by mouth daily as needed (allergic reaction). Patient not taking: No sig reported    [provider]  dorzolamide (TRUSOPT) 2 % ophthalmic solution Place 1 drop into both eyes 2 (two) times daily.     [provider]  famotidine (PEPCID) 10 MG tablet Take 10 mg by mouth daily as needed (take with benadryl when stung). Patient not taking: No sig reported    [provider]  levothyroxine (SYNTHROID) 88 MCG tablet Take 1 tablet (88 mcg total) by mouth See admin instructions. Take 44 mcg on Mon and Fri, Take 88 mcg on Sun, Tues, Wed, Thurs, and Sat 05/11/19   Olalere, Adewale A, MD  lidocaine (LIDODERM) 5 % Place 1 patch onto the skin daily. Remove & Discard patch within 12 hours or as directed by MD 01/26/20   [name], MD  lidocaine-prilocaine (EMLA) cream Apply to affected area once 09/29/19   Ennever, Rudell Cobb, MD  LORazepam (ATIVAN) 0.5 MG tablet Take 1 tablet (0.5 mg total) by mouth every 6 (six) hours as needed (Nausea or vomiting). 09/29/19   [name], MD  ondansetron (ZOFRAN) 8 MG tablet Take 1 tablet (8 mg total) by mouth 2 (two) times daily as needed (Nausea or vomiting). Patient not taking: No sig reported 09/29/19   [name], MD  polyethylene glycol (MIRALAX / GLYCOLAX) 17 g packet Take 17 g by mouth daily.    [provider]  prochlorperazine (COMPAZINE) 10 MG tablet Take 1 tablet (10 mg total) by mouth every 6 (six) hours as needed (Nausea or vomiting). Patient not taking: No sig reported 09/29/19   [name], MD  sennosides-docusate sodium (SENOKOT-S) 8.6-50 MG tablet Take 2 tablets by mouth daily.    [provider]  sucralfate (CARAFATE) 1 g tablet Take 1 tablet (1 g total) by mouth 4 (four) times daily  -  with meals and at bedtime. As needed Patient not taking: No sig reported 06/22/19   Tish Men, MD  travoprost, benzalkonium, (TRAVATAN) 0.004 % ophthalmic solution Place 1 drop into both eyes at bedtime.    [provider]    Allergies    Bee venom, Timolol, Brimonidine, and Hydrocodone  Review of Systems   Review of Systems  Neurological: Positive for facial asymmetry.  All other systems reviewed and are negative.   Physical Exam Updated Vital Signs BP (!) 152/76   Pulse 86   Temp 97.9 F (36.6 C) (Oral)   Resp 15   Ht 4\' 11"  (1.499 m)   Wt 47 kg   SpO2 97%   BMI 20.92 kg/m   Physical  Exam Vitals and nursing note reviewed.  Constitutional:      General: She is not in acute distress.    Appearance: She is well-developed and well-nourished. She is not ill-appearing.  HENT:     Head: Normocephalic and atraumatic.     Right Ear: Tympanic membrane, ear canal and external ear normal.     Ears:     Comments: No vesicles Eyes:     Conjunctiva/sclera: Conjunctivae normal.  Cardiovascular:     Rate and Rhythm: Normal rate and regular rhythm.  Pulmonary:     Effort: Pulmonary effort is normal. No respiratory distress.     Breath sounds: Normal breath sounds.  Abdominal:     Palpations: Abdomen is soft.  Skin:    General: Skin is warm.  Neurological:     Mental Status: She is alert.     Comments: Mental Status:  Alert, oriented, thought content appropriate, able to give a coherent history. Speech fluent without evidence of aphasia. Able to follow 2 step commands without difficulty.  Cranial Nerves:  II:  Peripheral visual fields grossly normal, pupils equal, round, reactive to light III,IV, VI: ptosis present on right, extra-ocular motions intact bilaterally  V,VII: smile asymmetric with droop on right, asymmetry with right eyebrow raise - no wrinkling of forehead, facial light touch sensation equal VIII: hearing grossly normal to voice  X: uvula elevates  symmetrically  XI: bilateral shoulder shrug symmetric and strong XII: midline tongue extension without fassiculations Motor:  Normal tone. 5/5 strength in upper and lower extremities bilaterally including strong and equal grip strength and dorsiflexion/plantar flexion Sensory: grossly normal in all extremities.  Cerebellar: normal finger-to-nose with bilateral upper extremities Gait: normal gait and balance CV: distal pulses palpable throughout    Psychiatric:        Mood and Affect: Mood and affect normal.        Behavior: Behavior normal.     ED Results / Procedures / Treatments   Labs (all labs ordered are listed, but only abnormal results are displayed) Labs Reviewed  COMPREHENSIVE METABOLIC PANEL - Abnormal; Notable for the following components:      Result Value   Sodium 129 (*)    Chloride 93 (*)    Glucose, Bld 101 (*)    Creatinine, Ser 0.43 (*)    AST 12 (*)    All other components within normal limits  PROTIME-INR  APTT  CBC  DIFFERENTIAL  CBG MONITORING, ED  CBG MONITORING, ED    EKG EKG Interpretation  Date/Time:  Tuesday February 22 2020 16:14:36 EST Ventricular Rate:  93 PR Interval:    QRS Duration: 85 QT Interval:  381 QTC Calculation: 474 R Axis:   69 Text Interpretation: Sinus rhythm Anteroseptal infarct, old When compared to prior,  similar appearnce. No STEMI Confirmed by Antony Blackbird [id number]) on 02/22/2020 4:31:20 PM   Radiology CT HEAD WO CONTRAST  Result Date: 02/22/2020 CLINICAL DATA:  75 year old female with neurologic deficit. History of right lung mass. EXAM: CT HEAD WITHOUT CONTRAST TECHNIQUE: Contiguous axial images were obtained from the base of the skull through the vertex without intravenous contrast. COMPARISON:  Brain MRI dated 05/19/2019. FINDINGS: Brain: The ventricles and sulci appropriate size for patient's age. Minimal periventricular and deep white matter chronic microvascular ischemic changes noted. There is no acute  intracranial hemorrhage. No mass effect midline shift no extra-axial fluid collection. Vascular: No hyperdense vessel or unexpected calcification. Skull: Normal. Negative for fracture or focal lesion. Sinuses/Orbits: No acute finding.  Other: None IMPRESSION: No acute intracranial pathology. Electronically Signed   By: Anner Crete M.D.   On: 02/22/2020 16:15    Procedures Procedures (including critical care time)  Medications Ordered in ED Medications  predniSONE (DELTASONE) tablet 60 mg (60 mg Oral Given 02/22/20 1911)  valACYclovir (VALTREX) tablet 1,000 mg (1,000 mg Oral Given 02/22/20 1911)    ED Course  I have reviewed the triage vital signs and the nursing notes.  Pertinent labs & imaging results that were available during my care of the patient were reviewed by me and considered in my medical decision making (see chart for details).  Clinical Course as of 02/22/20 1951  Tue Feb 22, 2020  1620 Pt evaluated.  Right-sided facial weakness that does not spare the right brow/forehead with normal sensation.  No other focal neuro deficits. outside of window for stroke. Dr. Sherry Ruffing made aware. Consider bells palsy as well, though she is also noted to be hypertensive. CT negative. [JR]  [id number] with [facility] pharmacist. No interactions with acyclovir and prednisone.  [JR]    Clinical Course User Index [JR] Robinson, [place] N, PA-C   MDM Rules/Calculators/A&P                          Patient presenting with right-sided facial droop that began yesterday afternoon around noon to 1 PM.  She has no other associated symptoms including no numbness or weakness of her extremities, no headache or visual field deficits.  She is hypertensive on arrival though this gradually improved without intervention.  On examination, she is very well-appearing. Right sided facial palsy is noted that does involve the right eye brow with flattening of the wrinkles in the right forehead. No sensation deficit to  the face, no other abnormal neuro findings. No vesicles in the ear canal or on the TM to suggest ramsay hunt.   Laboratory workup and CT imaging initiated in triage. CT is negative. Labs with hyponatremia, chronic and unchanged from previous. ECG in sinus rhythm. Patient discussed with and evaluated by attending Dr. Sherry Ruffing. He agrees presentation is most consistent with bells palsy with typical presentation. Had shared decision making with patient regarding treatment for bells palsy vs offer for additional imaging, though presentation seems most consistent with bells palsy. Patient strongly prefers treatment for bells palsy and discharge.  Discussed her current cancer treatment, durvalumab, with ED pharmacist.  Valacyclovir has no documented interaction with this medication.  There is suggestion that higher doses of prednisone may decrease the effectiveness of her medication.  Had this discussion with patient.  She is given a dose here in the ED and prescription for prednisone as well as valacyclovir.  She has appointment tomorrow morning with Dr. Marin Olp, she is instructed to discuss dosing of prednisone and consideration with her current cancer treatment for any possible need for dose adjustment.  She verbalized understanding of this.  She is instructed to apply artificial tears to her right eye and protect her eye at bedtime using an eye patch.   she is instructed to return to the emergency department should she develop any new symptoms concerning for stroke. Patient verbalized understanding and is agreeable with d/c to home. Discharged in no acute distress.  Discussed results, findings, treatment and follow up. Patient advised of return precautions. Patient verbalized understanding and agreed with plan.  Final Clinical Impression(s) / ED Diagnoses Final diagnoses:  Bell's palsy    Rx / DC Orders ED Discharge Orders  Ordered    valACYclovir (VALTREX) 1000 MG tablet  3 times daily         02/22/20 1859    predniSONE (DELTASONE) 20 MG tablet  Daily        02/22/20 1859           Robinson, [place] N, PA-C 02/22/20 1951    Tegeler, Gwenyth Allegra, MD 02/23/20 [phone]

## 2020-02-22 NOTE — Discharge Instructions (Addendum)
Use Artificial tear drops in your right eye (liquid or gel form) 4 times daily, up to hourly as needed. Wear an eye patch on your eye at night to hold your lid shut or simply tape your lid shut with the paper tape provided. Take the valacyclovir 3 times daily for 1 week. You have been prescribed prednisone, however it is a very important you discuss the dosage with your oncologist in the morning at your appointment considering your current medications.  He may want to adjust the dosage. It is important you go to the emergency department if you develop new neurologic symptoms including any new numbness or weakness in your extremities, problems with your balance, or other concerning symptoms.

## 2020-02-22 NOTE — Progress Notes (Signed)
Patient stating that last night she noticed her R eye drooping. She said "it looked more open than the left one". She also noticed that when she ate she bit the inside of her mouth several times. She called her daughter today and her daughter told her to perform several activities and she noticed that went she smiled it was not symmetrical. Otherwise her movement, strength and coordination is normal.   Explained to the patient that her symptoms could be indicative of TIA, or stroke and that she needed to be worked up in the ED. I expressed understanding regarding the extended wait times and numbers in the EDs right now due to covid. She understood the need to acute work up. She has an appointment here tomorrow that I told her she didn't need to worry about if she was in the ED. Will follow up.   Oncology Nurse Navigator Documentation  Oncology Nurse Navigator Flowsheets 02/22/2020  Abnormal Finding Date -  Diagnosis Status -  Phase of Treatment -  Chemo/Radiation Concurrent Actual Start Date: -  Chemo/Radiation Concurrent Actual End Date: -  Navigator Follow Up Date: -  Navigator Follow Up Reason: -  Navigation Complete Date: -  Post Navigation: Continue to Follow Patient? -  Reason Not Navigating Patient: -  Financial planner  Referral Date to RadOnc/MedOnc -  Navigator Encounter Type Telephone  Telephone Symptom Mgt  Treatment Initiated Date -  Patient Visit Type -  Treatment Phase Active Tx  Barriers/Navigation Needs Coordination of Care;Education  Education Other  Interventions Psycho-Social Support  Acuity Level 1-No Barriers  Referrals -  Coordination of Care Other  Education Method Verbal  Support Groups/Services Friends and Family  Time Spent with Patient 52

## 2020-02-22 NOTE — ED Triage Notes (Signed)
Right sided facial droop, last known normal yesterday at 1300, Hypertensive

## 2020-02-22 NOTE — ED Notes (Signed)
Patient transported to CT 

## 2020-02-22 NOTE — ED Notes (Signed)
Noted to have no raise in right side of mouth with smile

## 2020-02-23 ENCOUNTER — Telehealth: Payer: Self-pay

## 2020-02-23 ENCOUNTER — Inpatient Hospital Stay: Payer: Medicare Other

## 2020-02-23 ENCOUNTER — Inpatient Hospital Stay: Payer: Medicare Other | Attending: Hematology | Admitting: Hematology & Oncology

## 2020-02-23 ENCOUNTER — Encounter: Payer: Self-pay | Admitting: Hematology & Oncology

## 2020-02-23 ENCOUNTER — Other Ambulatory Visit: Payer: Self-pay

## 2020-02-23 VITALS — BP 158/84 | HR 104 | Temp 98.0°F | Resp 18 | Wt 108.0 lb

## 2020-02-23 VITALS — BP 153/65 | HR 90

## 2020-02-23 DIAGNOSIS — Z5112 Encounter for antineoplastic immunotherapy: Secondary | ICD-10-CM | POA: Diagnosis not present

## 2020-02-23 DIAGNOSIS — C3490 Malignant neoplasm of unspecified part of unspecified bronchus or lung: Secondary | ICD-10-CM | POA: Diagnosis not present

## 2020-02-23 DIAGNOSIS — Z79899 Other long term (current) drug therapy: Secondary | ICD-10-CM | POA: Insufficient documentation

## 2020-02-23 DIAGNOSIS — E079 Disorder of thyroid, unspecified: Secondary | ICD-10-CM | POA: Diagnosis not present

## 2020-02-23 DIAGNOSIS — C3431 Malignant neoplasm of lower lobe, right bronchus or lung: Secondary | ICD-10-CM | POA: Insufficient documentation

## 2020-02-23 DIAGNOSIS — Z95828 Presence of other vascular implants and grafts: Secondary | ICD-10-CM

## 2020-02-23 MED ORDER — SODIUM CHLORIDE 0.9% FLUSH
10.0000 mL | INTRAVENOUS | Status: DC | PRN
  Administered 2020-02-23: 10 mL via INTRAVENOUS
  Filled 2020-02-23: qty 10

## 2020-02-23 MED ORDER — SODIUM CHLORIDE 0.9 % IV SOLN
INTRAVENOUS | Status: AC
  Filled 2020-02-23: qty 250

## 2020-02-23 MED ORDER — HEPARIN SOD (PORK) LOCK FLUSH 100 UNIT/ML IV SOLN
500.0000 [IU] | Freq: Once | INTRAVENOUS | Status: AC
  Administered 2020-02-23: 500 [IU] via INTRAVENOUS
  Filled 2020-02-23: qty 5

## 2020-02-23 NOTE — Patient Instructions (Signed)
Dehydration, Adult Dehydration is condition in which there is not enough water or other fluids in the body. This happens when a person loses more fluids than he or she takes in. Important body parts cannot work right without the right amount of fluids. Any loss of fluids from the body can cause dehydration. Dehydration can be mild, worse, or very bad. It should be treated right away to keep it from getting very bad. What are the causes? This condition may be caused by:  Conditions that cause loss of water or other fluids, such as: ? Watery poop (diarrhea). ? Vomiting. ? Sweating a lot. ? Peeing (urinating) a lot.  Not drinking enough fluids, especially when you: ? Are ill. ? Are doing things that take a lot of energy to do.  Other illnesses and conditions, such as fever or infection.  Certain medicines, such as medicines that take extra fluid out of the body (diuretics).  Lack of safe drinking water.  Not being able to get enough water and food. What increases the risk? The following factors may make you more likely to develop this condition:  Having a long-term (chronic) illness that has not been treated the right way, such as: ? Diabetes. ? Heart disease. ? Kidney disease.  Being 65 years of age or older.  Having a disability.  Living in a place that is high above the ground or sea (high in altitude). The thinner, dried air causes more fluid loss.  Doing exercises that put stress on your body for a long time. What are the signs or symptoms? Symptoms of dehydration depend on how bad it is. Mild or worse dehydration  Thirst.  Dry lips or dry mouth.  Feeling dizzy or light-headed, especially when you stand up from sitting.  Muscle cramps.  Your body making: ? Dark pee (urine). Pee may be the color of tea. ? Less pee than normal. ? Less tears than normal.  Headache. Very bad dehydration  Changes in skin. Skin may: ? Be cold to the touch (clammy). ? Be blotchy  or pale. ? Not go back to normal right after you lightly pinch it and let it go.  Little or no tears, pee, or sweat.  Changes in vital signs, such as: ? Fast breathing. ? Low blood pressure. ? Weak pulse. ? Pulse that is more than 100 beats a minute when you are sitting still.  Other changes, such as: ? Feeling very thirsty. ? Eyes that look hollow (sunken). ? Cold hands and feet. ? Being mixed up (confused). ? Being very tired (lethargic) or having trouble waking from sleep. ? Short-term weight loss. ? Loss of consciousness. How is this treated? Treatment for this condition depends on how bad it is. Treatment should start right away. Do not wait until your condition gets very bad. Very bad dehydration is an emergency. You will need to go to a hospital.  Mild or worse dehydration can be treated at home. You may be asked to: ? Drink more fluids. ? Drink an oral rehydration solution (ORS). This drink helps get the right amounts of fluids and salts and minerals in the blood (electrolytes).  Very bad dehydration can be treated: ? With fluids through an IV tube. ? By getting normal levels of salts and minerals in your blood. This is often done by giving salts and minerals through a tube. The tube is passed through your nose and into your stomach. ? By treating the root cause. Follow these instructions at   home: Oral rehydration solution If told by your doctor, drink an ORS:  Make an ORS. Use instructions on the package.  Start by drinking small amounts, about  cup (120 mL) every 5-10 minutes.  Slowly drink more until you have had the amount that your doctor said to have. Eating and drinking  Drink enough clear fluid to keep your pee pale yellow. If you were told to drink an ORS, finish the ORS first. Then, start slowly drinking other clear fluids. Drink fluids such as: ? Water. Do not drink only water. Doing that can make the salt (sodium) level in your body get too low. ? Water  from ice chips you suck on. ? Fruit juice that you have added water to (diluted). ? Low-calorie sports drinks.  Eat foods that have the right amounts of salts and minerals, such as: ? Bananas. ? Oranges. ? Potatoes. ? Tomatoes. ? Spinach.  Do not drink alcohol.  Avoid: ? Drinks that have a lot of sugar. These include:  High-calorie sports drinks.  Fruit juice that you did not add water to.  Soda.  Caffeine. ? Foods that are greasy or have a lot of fat or sugar.         General instructions  Take over-the-counter and prescription medicines only as told by your doctor.  Do not take salt tablets. Doing that can make the salt level in your body get too high.  Return to your normal activities as told by your doctor. Ask your doctor what activities are safe for you.  Keep all follow-up visits as told by your doctor. This is important. Contact a doctor if:  You have pain in your belly (abdomen) and the pain: ? Gets worse. ? Stays in one place.  You have a rash.  You have a stiff neck.  You get angry or annoyed (irritable) more easily than normal.  You are more tired or have a harder time waking than normal.  You feel: ? Weak or dizzy. ? Very thirsty. Get help right away if you have:  Any symptoms of very bad dehydration.  Symptoms of vomiting, such as: ? You cannot eat or drink without vomiting. ? Your vomiting gets worse or does not go away. ? Your vomit has blood or green stuff in it.  Symptoms that get worse with treatment.  A fever.  A very bad headache.  Problems with peeing or pooping (having a bowel movement), such as: ? Watery poop that gets worse or does not go away. ? Blood in your poop (stool). This may cause poop to look black and tarry. ? Not peeing in 6-8 hours. ? Peeing only a small amount of very dark pee in 6-8 hours.  Trouble breathing. These symptoms may be an emergency. Do not wait to see if the symptoms will go away. Get  medical help right away. Call your local emergency services (911 in the [place] Do not drive yourself to the hospital. Summary  Dehydration is a condition in which there is not enough water or other fluids in the body. This happens when a person loses more fluids than he or she takes in.  Treatment for this condition depends on how bad it is. Treatment should be started right away. Do not wait until your condition gets very bad.  Drink enough clear fluid to keep your pee pale yellow. If you were told to drink an oral rehydration solution (ORS), finish the ORS first. Then, start slowly drinking other clear fluids.    Take over-the-counter and prescription medicines only as told by your doctor.  Get help right away if you have any symptoms of very bad dehydration. This information is not intended to replace advice given to you by your health care provider. Make sure you discuss any questions you have with your health care provider. Document Revised: 09/10/2018 Document Reviewed: 09/10/2018 Elsevier Patient Education  2021 [employer]  

## 2020-02-23 NOTE — Progress Notes (Signed)
Pt discharged in no apparent distress. Pt left ambulatory without assistance. Pt aware of discharge instructions and verbalized understanding and had no further questions.Stable and ASX upon discharge.

## 2020-02-23 NOTE — Progress Notes (Signed)
Hematology and Oncology Follow Up Visit  [name] [id number] 07-30-1945 75 y.o. 02/23/2020   Principle Diagnosis:  Stage IIIA (T4N0M0) squamous cell carcinoma the right lower lung  Past Therapy: Patient completed definitive chemoradiation therapy with weekly carboplatinum /Taxol - completed on 07/16/2019  Current Therapy:        Durvalumab -- maintenance -- s/p cycle #4   Interim History:  [name] is here today for follow-up.  Unfortunately, she developed Bell's palsy.  She said that on Monday, she began to note some numbness on the right side of her face.  She felt that she could not really close her right eye fully.  She is having some problems chewing on the right side of her mouth.  She ultimately went to the emergency room.  She had a CT scan of the brain.  This was unremarkable.  It was felt that she had Bell's palsy.  She was started on some prednisone and some Valtrex.  She is doing okay.  We are not going to treat her today.  With her being on the prednisone, we really need to get this off her.  She seems to be doing pretty well.  She had a nice Christmas and New Year's.  She has had no problems with cough.  There is no fever.  She has had no nausea or vomiting.  She has had no change in bowel or bladder habits.  I just feel bad that she has this Bell's palsy.  Overall, performance status is ECOG 1.     Medications:  Allergies as of 02/23/2020      Reactions   Bee Venom Swelling   Timolol Nausea Only   Codeine Other (See Comments)   Brimonidine Nausea Only   Hydrocodone Itching, Rash      Medication List       Accurate as of February 23, 2020  9:36 AM. If you have any questions, ask your nurse or doctor.        amoxicillin 500 MG capsule Commonly known as: AMOXIL Take 4 capsules (2000 mg) one hour prior to dental appointment.   chlorpheniramine-HYDROcodone 10-8 MG/5ML Suer Commonly known as: TUSSIONEX Take 5 mLs by mouth every 12 (twelve)  hours as needed for cough.   diphenhydrAMINE 25 MG tablet Commonly known as: BENADRYL Take 50 mg by mouth daily as needed (allergic reaction).   dorzolamide 2 % ophthalmic solution Commonly known as: TRUSOPT Place 1 drop into both eyes 2 (two) times daily.   famotidine 10 MG tablet Commonly known as: PEPCID Take 10 mg by mouth daily as needed (take with benadryl when stung).   levothyroxine 88 MCG tablet Commonly known as: SYNTHROID Take 1 tablet (88 mcg total) by mouth See admin instructions. Take 44 mcg on Mon and Fri, Take 88 mcg on Sun, Tues, Wed, Thurs, and Sat   lidocaine 5 % Commonly known as: LIDODERM Place 1 patch onto the skin daily. Remove & Discard patch within 12 hours or as directed by MD   lidocaine-prilocaine cream Commonly known as: EMLA Apply to affected area once   LORazepam 0.5 MG tablet Commonly known as: Ativan Take 1 tablet (0.5 mg total) by mouth every 6 (six) hours as needed (Nausea or vomiting).   ondansetron 8 MG tablet Commonly known as: Zofran Take 1 tablet (8 mg total) by mouth 2 (two) times daily as needed (Nausea or vomiting).   polyethylene glycol 17 g packet Commonly known as: MIRALAX / GLYCOLAX Take 17 g  by mouth daily.   predniSONE 20 MG tablet Commonly known as: DELTASONE Take 3 tablets (60 mg total) by mouth daily for 5 days.   prochlorperazine 10 MG tablet Commonly known as: COMPAZINE Take 1 tablet (10 mg total) by mouth every 6 (six) hours as needed (Nausea or vomiting).   sennosides-docusate sodium 8.6-50 MG tablet Commonly known as: SENOKOT-S Take 2 tablets by mouth daily.   sucralfate 1 g tablet Commonly known as: CARAFATE Take 1 tablet (1 g total) by mouth 4 (four) times daily -  with meals and at bedtime. As needed   travoprost (benzalkonium) 0.004 % ophthalmic solution Commonly known as: TRAVATAN Place 1 drop into both eyes at bedtime.   valACYclovir 1000 MG tablet Commonly known as: VALTREX Take 1 tablet (1,000  mg total) by mouth 3 (three) times daily for 7 days.   Vitamin D 50 MCG (2000 UT) tablet Take 2,000 Units by mouth daily with supper.       Allergies:  Allergies  Allergen Reactions  . Bee Venom Swelling  . Timolol Nausea Only  . Codeine Other (See Comments)  . Brimonidine Nausea Only  . Hydrocodone Itching and Rash    Past Medical History, Surgical history, Social history, and Family History were reviewed and updated.  Review of Systems: Review of Systems  Constitutional: Positive for malaise/fatigue.  HENT: Negative.   Eyes: Positive for redness.  Respiratory: Positive for cough and sputum production.   Cardiovascular: Negative.   Gastrointestinal: Negative.   Genitourinary: Negative.   Musculoskeletal: Negative.   Skin: Negative.   Neurological: Positive for tingling and focal weakness.  Endo/Heme/Allergies: Negative.   Psychiatric/Behavioral: Negative.      Physical Exam:  weight is 108 lb (49 kg). Her oral temperature is 98 F (36.7 C). Her blood pressure is 158/84 (abnormal) and her pulse is 104 (abnormal). Her respiration is 18 and oxygen saturation is 97%.   Wt Readings from Last 3 Encounters:  02/23/20 108 lb (49 kg)  02/22/20 103 lb 9.6 oz (47 kg)  01/26/20 108 lb (49 kg)  Maybe weekly for labs  Physical Exam Vitals reviewed.  HENT:     Head: Normocephalic and atraumatic.     Mouth/Throat:     Mouth: Oropharynx is clear and moist.  Eyes:     Extraocular Movements: EOM normal.     Pupils: Pupils are equal, round, and reactive to light.  Cardiovascular:     Rate and Rhythm: Normal rate and regular rhythm.     Heart sounds: Normal heart sounds.  Pulmonary:     Effort: Pulmonary effort is normal.     Breath sounds: Normal breath sounds.     Comments: Lungs show distant breath sounds bilaterally.  She has good air movement bilaterally.  I do not hear any wheezes.  She has no decreased sounds to percussion. Abdominal:     General: Bowel sounds are  normal.     Palpations: Abdomen is soft.  Musculoskeletal:        General: No tenderness, deformity or edema. Normal range of motion.     Cervical back: Normal range of motion.  Lymphadenopathy:     Cervical: No cervical adenopathy.  Skin:    General: Skin is warm and dry.     Findings: No erythema or rash.  Neurological:     Mental Status: She is alert and oriented to person, place, and time.  Psychiatric:        Mood and Affect: Mood and affect  normal.        Behavior: Behavior normal.        Thought Content: Thought content normal.        Judgment: Judgment normal.       Lab Results  Component Value Date   WBC 6.2 02/22/2020   HGB 14.0 02/22/2020   HCT 40.9 02/22/2020   MCV 92.1 02/22/2020   PLT 262 02/22/2020   Lab Results  Component Value Date   FERRITIN 311 (H) 05/25/2019   IRON 22 (L) 05/25/2019   TIBC 288 05/25/2019   UIBC 265 05/25/2019   IRONPCTSAT 8 (L) 05/25/2019   Lab Results  Component Value Date   RBC 4.44 02/22/2020   No results found for: KPAFRELGTCHN, LAMBDASER, KAPLAMBRATIO No results found for: IGGSERUM, IGA, IGMSERUM No results found for: TOTALPROTELP, ALBUMINELP, A1GS, A2GS, BETS, BETA2SER, GAMS, MSPIKE, SPEI   Chemistry      Component Value Date/Time   NA 129 (L) 02/22/2020 1620   K 3.8 02/22/2020 1620   CL 93 (L) 02/22/2020 1620   CO2 26 02/22/2020 1620   BUN 15 02/22/2020 1620   CREATININE 0.43 (L) 02/22/2020 1620   CREATININE 0.53 01/26/2020 0920      Component Value Date/Time   CALCIUM 9.7 02/22/2020 1620   ALKPHOS 78 02/22/2020 1620   AST 12 (L) 02/22/2020 1620   AST 7 (L) 01/26/2020 0920   ALT 18 02/22/2020 1620   ALT 9 01/26/2020 0920   BILITOT 0.5 02/22/2020 1620   BILITOT 0.4 01/26/2020 0920       Impression and Plan: Ms. Vultaggio is a very nice 75 yo caucasian female with locally advanced stage IIIa non-small cell carcinoma the right lower lung.    She received chemotherapy and radiation therapy with a very nice  response.  She now is on maintenance Durvalumab.  We are going to hold the Durvalumab for a couple weeks.  I want to see this Bell's palsy improve.  I want her off the prednisone.  She is on prednisone 60 mg a day for 5 days.  I will give her some IV fluid today.  I do think she is little bit dehydrated.  We will plan to get her back in 2 weeks to see how things are going.  Hopefully, by then, there will be more movement with the right ocular muscles.    [name], MD 1/12/20229:36 AM

## 2020-02-23 NOTE — Telephone Encounter (Signed)
aptps made per 1./12/22 los and pt will rec appts in tx/avs...aom

## 2020-02-29 ENCOUNTER — Encounter: Payer: Self-pay | Admitting: Neurology

## 2020-03-09 ENCOUNTER — Telehealth: Payer: Self-pay | Admitting: *Deleted

## 2020-03-09 ENCOUNTER — Other Ambulatory Visit: Payer: Self-pay

## 2020-03-09 ENCOUNTER — Inpatient Hospital Stay: Payer: Medicare Other

## 2020-03-09 ENCOUNTER — Encounter: Payer: Self-pay | Admitting: Hematology & Oncology

## 2020-03-09 ENCOUNTER — Inpatient Hospital Stay (HOSPITAL_BASED_OUTPATIENT_CLINIC_OR_DEPARTMENT_OTHER): Payer: Medicare Other | Admitting: Hematology & Oncology

## 2020-03-09 VITALS — BP 140/64 | HR 79 | Temp 98.2°F | Resp 16 | Wt 110.0 lb

## 2020-03-09 DIAGNOSIS — E079 Disorder of thyroid, unspecified: Secondary | ICD-10-CM

## 2020-03-09 DIAGNOSIS — Z5112 Encounter for antineoplastic immunotherapy: Secondary | ICD-10-CM | POA: Diagnosis not present

## 2020-03-09 DIAGNOSIS — Z79899 Other long term (current) drug therapy: Secondary | ICD-10-CM | POA: Diagnosis not present

## 2020-03-09 DIAGNOSIS — C3431 Malignant neoplasm of lower lobe, right bronchus or lung: Secondary | ICD-10-CM | POA: Diagnosis not present

## 2020-03-09 DIAGNOSIS — C3490 Malignant neoplasm of unspecified part of unspecified bronchus or lung: Secondary | ICD-10-CM

## 2020-03-09 DIAGNOSIS — C3491 Malignant neoplasm of unspecified part of right bronchus or lung: Secondary | ICD-10-CM

## 2020-03-09 LAB — CMP (CANCER CENTER ONLY)
ALT: 15 U/L (ref 0–44)
AST: 9 U/L — ABNORMAL LOW (ref 15–41)
Albumin: 4.3 g/dL (ref 3.5–5.0)
Alkaline Phosphatase: 68 U/L (ref 38–126)
Anion gap: 8 (ref 5–15)
BUN: 18 mg/dL (ref 8–23)
CO2: 25 mmol/L (ref 22–32)
Calcium: 9.7 mg/dL (ref 8.9–10.3)
Chloride: 98 mmol/L (ref 98–111)
Creatinine: 0.59 mg/dL (ref 0.44–1.00)
GFR, Estimated: >60 mL/min (ref >60)
Glucose, Bld: 109 mg/dL — ABNORMAL HIGH (ref 70–99)
Potassium: 3.8 mmol/L (ref 3.5–5.1)
Sodium: 131 mmol/L — ABNORMAL LOW (ref 135–145)
Total Bilirubin: 0.5 mg/dL (ref 0.3–1.2)
Total Protein: 6.8 g/dL (ref 6.5–8.1)

## 2020-03-09 LAB — CBC WITH DIFFERENTIAL (CANCER CENTER ONLY)
Abs Immature Granulocytes: 0.02 10*3/uL (ref 0.00–0.07)
Basophils Absolute: 0 10*3/uL (ref 0.0–0.1)
Basophils Relative: 0 %
Eosinophils Absolute: 0.1 10*3/uL (ref 0.0–0.5)
Eosinophils Relative: 3 %
HCT: 37.9 % (ref 36.0–46.0)
Hemoglobin: 12.9 g/dL (ref 12.0–15.0)
Immature Granulocytes: 0 %
Lymphocytes Relative: 17 %
Lymphs Abs: 0.9 10*3/uL (ref 0.7–4.0)
MCH: 31.4 pg (ref 26.0–34.0)
MCHC: 34 g/dL (ref 30.0–36.0)
MCV: 92.2 fL (ref 80.0–100.0)
Monocytes Absolute: 0.5 10*3/uL (ref 0.1–1.0)
Monocytes Relative: 10 %
Neutro Abs: 3.6 10*3/uL (ref 1.7–7.7)
Neutrophils Relative %: 70 %
Platelet Count: 211 10*3/uL (ref 150–400)
RBC: 4.11 MIL/uL (ref 3.87–5.11)
RDW: 14.2 % (ref 11.5–15.5)
WBC Count: 5.1 10*3/uL (ref 4.0–10.5)
nRBC: 0 % (ref 0.0–0.2)

## 2020-03-09 LAB — LACTATE DEHYDROGENASE: LDH: 138 U/L (ref 98–192)

## 2020-03-09 LAB — TSH: TSH: 6.498 u[IU]/mL — ABNORMAL HIGH (ref 0.308–3.960)

## 2020-03-09 MED ORDER — SODIUM CHLORIDE 0.9 % IV SOLN
1500.0000 mg | Freq: Once | INTRAVENOUS | Status: AC
  Administered 2020-03-09: 1500 mg via INTRAVENOUS
  Filled 2020-03-09: qty 30

## 2020-03-09 MED ORDER — SODIUM CHLORIDE 0.9% FLUSH
10.0000 mL | INTRAVENOUS | Status: DC | PRN
  Administered 2020-03-09: 10 mL
  Filled 2020-03-09: qty 10

## 2020-03-09 MED ORDER — HEPARIN SOD (PORK) LOCK FLUSH 100 UNIT/ML IV SOLN
500.0000 [IU] | Freq: Once | INTRAVENOUS | Status: AC | PRN
  Administered 2020-03-09: 500 [IU]
  Filled 2020-03-09: qty 5

## 2020-03-09 MED ORDER — SODIUM CHLORIDE 0.9 % IV SOLN
Freq: Once | INTRAVENOUS | Status: AC
  Filled 2020-03-09: qty 250

## 2020-03-09 MED ORDER — VALACYCLOVIR HCL 500 MG PO TABS: 500.0000 mg | ORAL_TABLET | Freq: Every day | ORAL | 8 refills | Status: DC

## 2020-03-09 NOTE — Patient Instructions (Signed)

## 2020-03-09 NOTE — Telephone Encounter (Signed)
Called pt notified results pt will call her PCP regarding synthroid Rx as Dr. Harrington Challenger is the prescriber. Labs faxed to Dr Harrington Challenger office per pt request

## 2020-03-09 NOTE — Progress Notes (Signed)
Hematology and Oncology Follow Up Visit  [name] [id number] 1945-08-14 75 y.o. 03/09/2020   Principle Diagnosis:  Stage IIIA (T4N0M0) squamous cell carcinoma the right lower lung  Past Therapy: Patient completed definitive chemoradiation therapy with weekly carboplatinum /Taxol - completed on 07/16/2019  Current Therapy:        Durvalumab -- maintenance -- s/p cycle #5   Interim History:  [name] is here today for follow-up.  Thankfully, her Bell's palsy on the right side of her face is improved.  She is off steroids.  She was on Valtrex.  I think she needs low-dose maintenance Valtrex.  She has had no problems with cough or shortness of breath.  She is complaining of some pain in the left shoulder.  This happens when she abducts her shoulder.  Again it might be that she could have a pinched nerve.  She has seen Dr. Rhona Raider of orthopedic surgery in the past.  He is done shoulder surgery on her.  I told her that she go back to see him again.  She may need a cortisone injection.  She has had no problems with bowels or bladder.  She has had no issues with rashes.  There has been no leg swelling.  Her appetite has been quite good.   Currently, her performance status is ECOG 1.   Medications:  Allergies as of 03/09/2020      Reactions   Bee Venom Swelling   Timolol Nausea Only   Codeine Other (See Comments)   Brimonidine Nausea Only   Hydrocodone Itching, Rash      Medication List       Accurate as of March 09, 2020  9:44 AM. If you have any questions, ask your nurse or doctor.        amoxicillin 500 MG capsule Commonly known as: AMOXIL Take 4 capsules (2000 mg) one hour prior to dental appointment.   chlorpheniramine-HYDROcodone 10-8 MG/5ML Suer Commonly known as: TUSSIONEX Take 5 mLs by mouth every 12 (twelve) hours as needed for cough.   diphenhydrAMINE 25 MG tablet Commonly known as: BENADRYL Take 50 mg by mouth daily as needed (allergic  reaction).   dorzolamide 2 % ophthalmic solution Commonly known as: TRUSOPT Place 1 drop into both eyes 2 (two) times daily.   famotidine 10 MG tablet Commonly known as: PEPCID Take 10 mg by mouth daily as needed (take with benadryl when stung).   levothyroxine 88 MCG tablet Commonly known as: SYNTHROID Take 1 tablet (88 mcg total) by mouth See admin instructions. Take 44 mcg on Mon and Fri, Take 88 mcg on Sun, Tues, Wed, Thurs, and Sat   lidocaine 5 % Commonly known as: LIDODERM Place 1 patch onto the skin daily. Remove & Discard patch within 12 hours or as directed by MD   lidocaine-prilocaine cream Commonly known as: EMLA Apply to affected area once   LORazepam 0.5 MG tablet Commonly known as: Ativan Take 1 tablet (0.5 mg total) by mouth every 6 (six) hours as needed (Nausea or vomiting).   ondansetron 8 MG tablet Commonly known as: Zofran Take 1 tablet (8 mg total) by mouth 2 (two) times daily as needed (Nausea or vomiting).   polyethylene glycol 17 g packet Commonly known as: MIRALAX / GLYCOLAX Take 17 g by mouth daily.   prochlorperazine 10 MG tablet Commonly known as: COMPAZINE Take 1 tablet (10 mg total) by mouth every 6 (six) hours as needed (Nausea or vomiting).   sennosides-docusate sodium 8.6-50  MG tablet Commonly known as: SENOKOT-S Take 2 tablets by mouth daily.   sucralfate 1 g tablet Commonly known as: CARAFATE Take 1 tablet (1 g total) by mouth 4 (four) times daily -  with meals and at bedtime. As needed   travoprost (benzalkonium) 0.004 % ophthalmic solution Commonly known as: TRAVATAN Place 1 drop into both eyes at bedtime.   Vitamin D 50 MCG (2000 UT) tablet Take 2,000 Units by mouth daily with supper.       Allergies:  Allergies  Allergen Reactions  . Bee Venom Swelling  . Timolol Nausea Only  . Codeine Other (See Comments)  . Brimonidine Nausea Only  . Hydrocodone Itching and Rash    Past Medical History, Surgical history, Social  history, and Family History were reviewed and updated.  Review of Systems: Review of Systems  Constitutional: Positive for malaise/fatigue.  HENT: Negative.   Eyes: Positive for redness.  Respiratory: Positive for cough and sputum production.   Cardiovascular: Negative.   Gastrointestinal: Negative.   Genitourinary: Negative.   Musculoskeletal: Negative.   Skin: Negative.   Neurological: Positive for tingling and focal weakness.  Endo/Heme/Allergies: Negative.   Psychiatric/Behavioral: Negative.      Physical Exam:  weight is 110 lb (49.9 kg). Her oral temperature is 98.2 F (36.8 C). Her blood pressure is 140/64 and her pulse is 79. Her respiration is 16 and oxygen saturation is 100%.   Wt Readings from Last 3 Encounters:  03/09/20 110 lb (49.9 kg)  02/23/20 108 lb (49 kg)  02/22/20 103 lb 9.6 oz (47 kg)  Maybe weekly for labs  Physical Exam Vitals reviewed.  HENT:     Head: Normocephalic and atraumatic.  Eyes:     Pupils: Pupils are equal, round, and reactive to light.  Cardiovascular:     Rate and Rhythm: Normal rate and regular rhythm.     Heart sounds: Normal heart sounds.  Pulmonary:     Effort: Pulmonary effort is normal.     Breath sounds: Normal breath sounds.     Comments: Lungs show distant breath sounds bilaterally.  She has good air movement bilaterally.  I do not hear any wheezes.  She has no decreased sounds to percussion. Abdominal:     General: Bowel sounds are normal.     Palpations: Abdomen is soft.  Musculoskeletal:        General: No tenderness or deformity. Normal range of motion.     Cervical back: Normal range of motion.  Lymphadenopathy:     Cervical: No cervical adenopathy.  Skin:    General: Skin is warm and dry.     Findings: No erythema or rash.  Neurological:     Mental Status: She is alert and oriented to person, place, and time.  Psychiatric:        Behavior: Behavior normal.        Thought Content: Thought content normal.         Judgment: Judgment normal.       Lab Results  Component Value Date   WBC 5.1 03/09/2020   HGB 12.9 03/09/2020   HCT 37.9 03/09/2020   MCV 92.2 03/09/2020   PLT 211 03/09/2020   Lab Results  Component Value Date   FERRITIN 311 (H) 05/25/2019   IRON 22 (L) 05/25/2019   TIBC 288 05/25/2019   UIBC 265 05/25/2019   IRONPCTSAT 8 (L) 05/25/2019   Lab Results  Component Value Date   RBC 4.11 03/09/2020   No  results found for: KPAFRELGTCHN, LAMBDASER, KAPLAMBRATIO No results found for: IGGSERUM, IGA, IGMSERUM No results found for: Ronnald Ramp, A1GS, A2GS, Violet Baldy, MSPIKE, SPEI   Chemistry      Component Value Date/Time   NA 131 (L) 03/09/2020 0840   K 3.8 03/09/2020 0840   CL 98 03/09/2020 0840   CO2 25 03/09/2020 0840   BUN 18 03/09/2020 0840   CREATININE 0.59 03/09/2020 0840      Component Value Date/Time   CALCIUM 9.7 03/09/2020 0840   ALKPHOS 68 03/09/2020 0840   AST 9 (L) 03/09/2020 0840   ALT 15 03/09/2020 0840   BILITOT 0.5 03/09/2020 0840       Impression and Plan: Ms. Koplin is a very nice 75 yo caucasian female with locally advanced stage IIIa non-small cell carcinoma the right lower lung.    She received chemotherapy and radiation therapy with a very nice response.  She now is on maintenance Durvalumab.  We are going to continue with her Durvalumab today.  We can get her back on treatment.  She is off steroids.  Again, she will need Valtrex as a maintenance.  I will put her on 500 mg a day.  We will plan for follow-up in another month.   [name], MD 1/27/20229:44 AM

## 2020-03-09 NOTE — Telephone Encounter (Signed)
-----   Message from [name], MD sent at 03/09/2020  3:44 PM EST ----- Call - the thyroid is a little low.  Need to increase the Synthroid up to 100 mcg a day.  Please call this in if possible!!  [name]

## 2020-03-09 NOTE — Patient Instructions (Signed)
[facility] Discharge Instructions for Patients Receiving Chemotherapy  Today you received the following chemotherapy agents Imfinzi  To help prevent nausea and vomiting after your treatment, we encourage you to take your nausea medication as prescribed by MD.   If you develop nausea and vomiting that is not controlled by your nausea medication, call the clinic.   BELOW ARE SYMPTOMS THAT SHOULD BE REPORTED IMMEDIATELY:  *FEVER GREATER THAN 100.5 F  *CHILLS WITH OR WITHOUT FEVER  NAUSEA AND VOMITING THAT IS NOT CONTROLLED WITH YOUR NAUSEA MEDICATION  *UNUSUAL SHORTNESS OF BREATH  *UNUSUAL BRUISING OR BLEEDING  TENDERNESS IN MOUTH AND THROAT WITH OR WITHOUT PRESENCE OF ULCERS  *URINARY PROBLEMS  *BOWEL PROBLEMS  UNUSUAL RASH Items with * indicate a potential emergency and should be followed up as soon as possible.  Feel free to call the clinic should you have any questions or concerns. The clinic phone number is [phone].  Please show the [facility] at check-in to the Emergency Department and triage nurse.

## 2020-03-31 ENCOUNTER — Telehealth: Payer: Self-pay | Admitting: *Deleted

## 2020-03-31 ENCOUNTER — Other Ambulatory Visit: Payer: Self-pay | Admitting: *Deleted

## 2020-03-31 MED ORDER — TRAMADOL HCL 50 MG PO TABS: 50.0000 mg | ORAL_TABLET | Freq: Four times a day (QID) | ORAL | 0 refills | Status: DC | PRN

## 2020-03-31 NOTE — Telephone Encounter (Signed)
Patient called stating that she is still having rib pain.  Requests something for pain. Dr Marin Olp notified.  Tramadol sent to patient pharmacy.  Reviewed medication management for constipation as she is having some problems with this. Patient understands instructions

## 2020-04-06 ENCOUNTER — Telehealth: Payer: Self-pay

## 2020-04-06 ENCOUNTER — Inpatient Hospital Stay: Payer: Medicare Other

## 2020-04-06 ENCOUNTER — Other Ambulatory Visit: Payer: Self-pay

## 2020-04-06 ENCOUNTER — Inpatient Hospital Stay: Payer: Medicare Other | Attending: Hematology

## 2020-04-06 ENCOUNTER — Inpatient Hospital Stay: Payer: Medicare Other | Admitting: Hematology & Oncology

## 2020-04-06 ENCOUNTER — Encounter: Payer: Self-pay | Admitting: Hematology & Oncology

## 2020-04-06 VITALS — BP 153/76 | HR 98 | Temp 97.9°F | Resp 20 | Wt 108.0 lb

## 2020-04-06 DIAGNOSIS — Z79899 Other long term (current) drug therapy: Secondary | ICD-10-CM | POA: Diagnosis not present

## 2020-04-06 DIAGNOSIS — C3491 Malignant neoplasm of unspecified part of right bronchus or lung: Secondary | ICD-10-CM

## 2020-04-06 DIAGNOSIS — Z5112 Encounter for antineoplastic immunotherapy: Secondary | ICD-10-CM | POA: Diagnosis not present

## 2020-04-06 DIAGNOSIS — C3431 Malignant neoplasm of lower lobe, right bronchus or lung: Secondary | ICD-10-CM | POA: Diagnosis not present

## 2020-04-06 DIAGNOSIS — E079 Disorder of thyroid, unspecified: Secondary | ICD-10-CM

## 2020-04-06 DIAGNOSIS — C3490 Malignant neoplasm of unspecified part of unspecified bronchus or lung: Secondary | ICD-10-CM | POA: Diagnosis not present

## 2020-04-06 LAB — CBC WITH DIFFERENTIAL (CANCER CENTER ONLY)
Abs Immature Granulocytes: 0.02 10*3/uL (ref 0.00–0.07)
Basophils Absolute: 0 10*3/uL (ref 0.0–0.1)
Basophils Relative: 0 %
Eosinophils Absolute: 0 10*3/uL (ref 0.0–0.5)
Eosinophils Relative: 1 %
HCT: 38 % (ref 36.0–46.0)
Hemoglobin: 13.3 g/dL (ref 12.0–15.0)
Immature Granulocytes: 0 %
Lymphocytes Relative: 12 %
Lymphs Abs: 0.7 10*3/uL (ref 0.7–4.0)
MCH: 31.5 pg (ref 26.0–34.0)
MCHC: 35 g/dL (ref 30.0–36.0)
MCV: 90 fL (ref 80.0–100.0)
Monocytes Absolute: 0.7 10*3/uL (ref 0.1–1.0)
Monocytes Relative: 13 %
Neutro Abs: 4.1 10*3/uL (ref 1.7–7.7)
Neutrophils Relative %: 74 %
Platelet Count: 232 10*3/uL (ref 150–400)
RBC: 4.22 MIL/uL (ref 3.87–5.11)
RDW: 13.6 % (ref 11.5–15.5)
WBC Count: 5.5 10*3/uL (ref 4.0–10.5)
nRBC: 0 % (ref 0.0–0.2)

## 2020-04-06 LAB — CMP (CANCER CENTER ONLY)
ALT: 12 U/L (ref 0–44)
AST: 9 U/L — ABNORMAL LOW (ref 15–41)
Albumin: 4.7 g/dL (ref 3.5–5.0)
Alkaline Phosphatase: 73 U/L (ref 38–126)
Anion gap: 7 (ref 5–15)
BUN: 12 mg/dL (ref 8–23)
CO2: 27 mmol/L (ref 22–32)
Calcium: 10.2 mg/dL (ref 8.9–10.3)
Chloride: 92 mmol/L — ABNORMAL LOW (ref 98–111)
Creatinine: 0.52 mg/dL (ref 0.44–1.00)
GFR, Estimated: >60 mL/min (ref >60)
Glucose, Bld: 86 mg/dL (ref 70–99)
Potassium: 3.8 mmol/L (ref 3.5–5.1)
Sodium: 126 mmol/L — ABNORMAL LOW (ref 135–145)
Total Bilirubin: 0.5 mg/dL (ref 0.3–1.2)
Total Protein: 7.4 g/dL (ref 6.5–8.1)

## 2020-04-06 LAB — LACTATE DEHYDROGENASE: LDH: 156 U/L (ref 98–192)

## 2020-04-06 MED ORDER — NALOXEGOL OXALATE 12.5 MG PO TABS: 12.5000 mg | ORAL_TABLET | Freq: Every day | ORAL | 4 refills | Status: DC

## 2020-04-06 MED ORDER — SODIUM CHLORIDE 0.9 % IV SOLN
1500.0000 mg | Freq: Once | INTRAVENOUS | Status: AC
  Administered 2020-04-06: 1500 mg via INTRAVENOUS
  Filled 2020-04-06: qty 30

## 2020-04-06 MED ORDER — SODIUM CHLORIDE 0.9% FLUSH
10.0000 mL | INTRAVENOUS | Status: DC | PRN
  Administered 2020-04-06: 10 mL
  Filled 2020-04-06: qty 10

## 2020-04-06 MED ORDER — SODIUM CHLORIDE 0.9 % IV SOLN
Freq: Once | INTRAVENOUS | Status: AC
  Filled 2020-04-06: qty 250

## 2020-04-06 MED ORDER — HEPARIN SOD (PORK) LOCK FLUSH 100 UNIT/ML IV SOLN
500.0000 [IU] | Freq: Once | INTRAVENOUS | Status: AC | PRN
  Administered 2020-04-06: 500 [IU]
  Filled 2020-04-06: qty 5

## 2020-04-06 NOTE — Patient Instructions (Signed)
[facility] Discharge Instructions for Patients Receiving Chemotherapy  Today you received the following chemotherapy agents Imfinzi  To help prevent nausea and vomiting after your treatment, we encourage you to take your nausea medication as prescribed by MD.   If you develop nausea and vomiting that is not controlled by your nausea medication, call the clinic.   BELOW ARE SYMPTOMS THAT SHOULD BE REPORTED IMMEDIATELY:  *FEVER GREATER THAN 100.5 F  *CHILLS WITH OR WITHOUT FEVER  NAUSEA AND VOMITING THAT IS NOT CONTROLLED WITH YOUR NAUSEA MEDICATION  *UNUSUAL SHORTNESS OF BREATH  *UNUSUAL BRUISING OR BLEEDING  TENDERNESS IN MOUTH AND THROAT WITH OR WITHOUT PRESENCE OF ULCERS  *URINARY PROBLEMS  *BOWEL PROBLEMS  UNUSUAL RASH Items with * indicate a potential emergency and should be followed up as soon as possible.  Feel free to call the clinic should you have any questions or concerns. The clinic phone number is [phone].  Please show the [facility] at check-in to the Emergency Department and triage nurse.

## 2020-04-06 NOTE — Telephone Encounter (Signed)
appts made per 04/06/20 los and pt will get sch in tx/avs, pt will get a call with PET once approved     [name]

## 2020-04-06 NOTE — Progress Notes (Signed)
Hematology and Oncology Follow Up Visit  [name] [id number] Sep 26, 1945 75 y.o. 04/06/2020   Principle Diagnosis:  Stage IIIA (T4N0M0) squamous cell carcinoma the right lower lung  Past Therapy: Patient completed definitive chemoradiation therapy with weekly carboplatinum /Taxol - completed on 07/16/2019  Current Therapy:        Durvalumab -- maintenance -- s/p cycle #6   Interim History:  [name] is here today for follow-up.  The problem that she is having is sleep.  Apparently she is having some abdominal issues.  She is constipated.  She tried MiraLAX.  She cannot tolerate this.  She then was tried on magnesium citrate.  She cannot tolerate this.  She threw this up.  I think that we should and try her on Movantik.  I think this would be reasonable.  I think this would work.  We will give her a low-dose.  She has had no problems with cough.  She has had no nausea or vomiting.  She has had no headache.  She has had no leg swelling.  The Bell's palsy has improved.  She is off steroids.  She is on maintenance low-dose Valtrex.  Her left shoulder is not bothering her any longer.  She has had no problems with fever.  There is been no bleeding.  Overall, her performance status is ECOG 1.     Medications:  Allergies as of 04/06/2020      Reactions   Bee Venom Swelling   Timolol Nausea Only   Codeine Other (See Comments)   Brimonidine Nausea Only   Hydrocodone Itching, Rash      Medication List       Accurate as of April 06, 2020 11:50 AM. If you have any questions, ask your nurse or doctor.        amoxicillin 500 MG capsule Commonly known as: AMOXIL Take 4 capsules (2000 mg) one hour prior to dental appointment.   chlorpheniramine-HYDROcodone 10-8 MG/5ML Suer Commonly known as: TUSSIONEX Take 5 mLs by mouth every 12 (twelve) hours as needed for cough.   diphenhydrAMINE 25 MG tablet Commonly known as: BENADRYL Take 50 mg by mouth daily as needed  (allergic reaction).   dorzolamide 2 % ophthalmic solution Commonly known as: TRUSOPT Place 1 drop into both eyes 2 (two) times daily.   famotidine 10 MG tablet Commonly known as: PEPCID Take 10 mg by mouth daily as needed (take with benadryl when stung).   levothyroxine 100 MCG tablet Commonly known as: SYNTHROID Take 100 mcg by mouth every morning. What changed: Another medication with the same name was removed. Continue taking this medication, and follow the directions you see here. Changed by: [name], MD   lidocaine 5 % Commonly known as: LIDODERM Place 1 patch onto the skin daily. Remove & Discard patch within 12 hours or as directed by MD   lidocaine-prilocaine cream Commonly known as: EMLA Apply to affected area once   LORazepam 0.5 MG tablet Commonly known as: Ativan Take 1 tablet (0.5 mg total) by mouth every 6 (six) hours as needed (Nausea or vomiting).   ondansetron 8 MG tablet Commonly known as: Zofran Take 1 tablet (8 mg total) by mouth 2 (two) times daily as needed (Nausea or vomiting).   polyethylene glycol 17 g packet Commonly known as: MIRALAX / GLYCOLAX Take 17 g by mouth daily.   prochlorperazine 10 MG tablet Commonly known as: COMPAZINE Take 1 tablet (10 mg total) by mouth every 6 (six) hours  as needed (Nausea or vomiting).   sennosides-docusate sodium 8.6-50 MG tablet Commonly known as: SENOKOT-S Take 2 tablets by mouth daily.   sucralfate 1 g tablet Commonly known as: CARAFATE Take 1 tablet (1 g total) by mouth 4 (four) times daily -  with meals and at bedtime. As needed   traMADol 50 MG tablet Commonly known as: ULTRAM Take 1 tablet (50 mg total) by mouth every 6 (six) hours as needed.   travoprost (benzalkonium) 0.004 % ophthalmic solution Commonly known as: TRAVATAN Place 1 drop into both eyes at bedtime.   valACYclovir 500 MG tablet Commonly known as: VALTREX Take 1 tablet (500 mg total) by mouth daily.   Vitamin D 50 MCG  (2000 UT) tablet Take 2,000 Units by mouth daily with supper.       Allergies:  Allergies  Allergen Reactions  . Bee Venom Swelling  . Timolol Nausea Only  . Codeine Other (See Comments)  . Brimonidine Nausea Only  . Hydrocodone Itching and Rash    Past Medical History, Surgical history, Social history, and Family History were reviewed and updated.  Review of Systems: Review of Systems  Constitutional: Positive for malaise/fatigue.  HENT: Negative.   Eyes: Positive for redness.  Respiratory: Positive for cough and sputum production.   Cardiovascular: Negative.   Gastrointestinal: Negative.   Genitourinary: Negative.   Musculoskeletal: Negative.   Skin: Negative.   Neurological: Positive for tingling and focal weakness.  Endo/Heme/Allergies: Negative.   Psychiatric/Behavioral: Negative.      Physical Exam:  weight is 108 lb (49 kg). Her oral temperature is 97.9 F (36.6 C). Her blood pressure is 153/76 (abnormal) and her pulse is 98. Her respiration is 20 and oxygen saturation is 100%.   Wt Readings from Last 3 Encounters:  04/06/20 108 lb (49 kg)  03/09/20 110 lb (49.9 kg)  02/23/20 108 lb (49 kg)  Maybe weekly for labs  Physical Exam Vitals reviewed.  HENT:     Head: Normocephalic and atraumatic.  Eyes:     Pupils: Pupils are equal, round, and reactive to light.  Cardiovascular:     Rate and Rhythm: Normal rate and regular rhythm.     Heart sounds: Normal heart sounds.  Pulmonary:     Effort: Pulmonary effort is normal.     Breath sounds: Normal breath sounds.     Comments: Lungs show distant breath sounds bilaterally.  She has good air movement bilaterally.  I do not hear any wheezes.  She has no decreased sounds to percussion. Abdominal:     General: Bowel sounds are normal.     Palpations: Abdomen is soft.  Musculoskeletal:        General: No tenderness or deformity. Normal range of motion.     Cervical back: Normal range of motion.   Lymphadenopathy:     Cervical: No cervical adenopathy.  Skin:    General: Skin is warm and dry.     Findings: No erythema or rash.  Neurological:     Mental Status: She is alert and oriented to person, place, and time.  Psychiatric:        Behavior: Behavior normal.        Thought Content: Thought content normal.        Judgment: Judgment normal.       Lab Results  Component Value Date   WBC 5.5 04/06/2020   HGB 13.3 04/06/2020   HCT 38.0 04/06/2020   MCV 90.0 04/06/2020   PLT 232 04/06/2020  Lab Results  Component Value Date   FERRITIN 311 (H) 05/25/2019   IRON 22 (L) 05/25/2019   TIBC 288 05/25/2019   UIBC 265 05/25/2019   IRONPCTSAT 8 (L) 05/25/2019   Lab Results  Component Value Date   RBC 4.22 04/06/2020   No results found for: KPAFRELGTCHN, LAMBDASER, KAPLAMBRATIO No results found for: IGGSERUM, IGA, IGMSERUM No results found for: TOTALPROTELP, ALBUMINELP, A1GS, Lenoria Farrier, GAMS, MSPIKE, SPEI   Chemistry      Component Value Date/Time   NA 126 (L) 04/06/2020 1030   K 3.8 04/06/2020 1030   CL 92 (L) 04/06/2020 1030   CO2 27 04/06/2020 1030   BUN 12 04/06/2020 1030   CREATININE 0.52 04/06/2020 1030      Component Value Date/Time   CALCIUM 10.2 04/06/2020 1030   ALKPHOS 73 04/06/2020 1030   AST 9 (L) 04/06/2020 1030   ALT 12 04/06/2020 1030   BILITOT 0.5 04/06/2020 1030       Impression and Plan: [name] is a very nice 75 yo caucasian female with locally advanced stage IIIa non-small cell carcinoma the right lower lung.    She received chemotherapy and radiation therapy with a very nice response.  She now is on maintenance Durvalumab.  We are going to continue with her Durvalumab today.  We do have to do a scan on her.  I will get her set up with a PET scan before we see her back.  Hopefully, the Movantik will help with her constipation.  I am surprised that she does have constipation given that she is on immunotherapy.  We will plan  to get her back in another month.    [name], MD 2/24/202211:50 AM

## 2020-04-06 NOTE — Patient Instructions (Signed)
Implanted Port Insertion, Care After This sheet gives you information about how to care for yourself after your procedure. Your health care provider may also give you more specific instructions. If you have problems or questions, contact your health care provider. What can I expect after the procedure? After the procedure, it is common to have:  Discomfort at the port insertion site.  Bruising on the skin over the port. This should improve over 3-4 days. Follow these instructions at home: Port care  After your port is placed, you will get a manufacturer's information card. The card has information about your port. Keep this card with you at all times.  Take care of the port as told by your health care provider. Ask your health care provider if you or a family member can get training for taking care of the port at home. A home health care nurse may also take care of the port.  Make sure to remember what type of port you have. Incision care  Follow instructions from your health care provider about how to take care of your port insertion site. Make sure you: ? Wash your hands with soap and water before and after you change your bandage (dressing). If soap and water are not available, use hand sanitizer. ? Change your dressing as told by your health care provider. ? Leave stitches (sutures), skin glue, or adhesive strips in place. These skin closures may need to stay in place for 2 weeks or longer. If adhesive strip edges start to loosen and curl up, you may trim the loose edges. Do not remove adhesive strips completely unless your health care provider tells you to do that.  Check your port insertion site every day for signs of infection. Check for: ? Redness, swelling, or pain. ? Fluid or blood. ? Warmth. ? Pus or a bad smell.      Activity  Return to your normal activities as told by your health care provider. Ask your health care provider what activities are safe for you.  Do not  lift anything that is heavier than 10 lb (4.5 kg), or the limit that you are told, until your health care provider says that it is safe. General instructions  Take over-the-counter and prescription medicines only as told by your health care provider.  Do not take baths, swim, or use a hot tub until your health care provider approves. Ask your health care provider if you may take showers. You may only be allowed to take sponge baths.  Do not drive for 24 hours if you were given a sedative during your procedure.  Wear a medical alert bracelet in case of an emergency. This will tell any health care providers that you have a port.  Keep all follow-up visits as told by your health care provider. This is important. Contact a health care provider if:  You cannot flush your port with saline as directed, or you cannot draw blood from the port.  You have a fever or chills.  You have redness, swelling, or pain around your port insertion site.  You have fluid or blood coming from your port insertion site.  Your port insertion site feels warm to the touch.  You have pus or a bad smell coming from the port insertion site. Get help right away if:  You have chest pain or shortness of breath.  You have bleeding from your port that you cannot control. Summary  Take care of the port as told by your   health care provider. Keep the manufacturer's information card with you at all times.  Change your dressing as told by your health care provider.  Contact a health care provider if you have a fever or chills or if you have redness, swelling, or pain around your port insertion site.  Keep all follow-up visits as told by your health care provider. This information is not intended to replace advice given to you by your health care provider. Make sure you discuss any questions you have with your health care provider. Document Revised: 08/26/2017 Document Reviewed: 08/26/2017 Elsevier Patient Education   2021 Elsevier Inc.  

## 2020-04-06 NOTE — Progress Notes (Signed)
Pt discharged in no apparent distress. Pt left ambulatory without assistance. Pt aware of discharge instructions and verbalized understanding and had no further questions.  

## 2020-04-07 LAB — TSH: TSH: 2.141 u[IU]/mL (ref 0.308–3.960)

## 2020-04-17 ENCOUNTER — Other Ambulatory Visit: Payer: Self-pay | Admitting: *Deleted

## 2020-04-17 MED ORDER — TRAMADOL HCL 50 MG PO TABS: 50.0000 mg | ORAL_TABLET | Freq: Four times a day (QID) | ORAL | 0 refills | Status: DC | PRN

## 2020-04-20 ENCOUNTER — Telehealth: Payer: Self-pay | Admitting: *Deleted

## 2020-04-20 MED ORDER — LACTULOSE 10 GM/15ML PO SOLN: 20.0000 g | Freq: Three times a day (TID) | ORAL | 0 refills | Status: DC | PRN

## 2020-04-20 NOTE — Telephone Encounter (Signed)
Patient notified that [place] was denied from her insurance company and that Dr. Marin Olp will send in a prescription for Lactulose in place of the Movantik.  Pt is appreciative of call and requests that Lactulose be sent to [facility] on [address].

## 2020-04-25 ENCOUNTER — Telehealth: Payer: Self-pay | Admitting: *Deleted

## 2020-04-25 NOTE — Telephone Encounter (Signed)
Opened in error

## 2020-04-25 NOTE — Telephone Encounter (Signed)
Called pt regarding abdominal pain. Discussed ways to eliminate gas, rocking in recliner or rocking chair, walking around house, to and from mailbox or down driveway. Pt states," I know its gas, I've been blowing up the house".  Instructed pt to try gas-x, lactulose can be taken for constipation q 8hrs and if symptoms persist to call PCP. Pt verbalized understanding. No further concerns.

## 2020-04-27 ENCOUNTER — Ambulatory Visit: Payer: Medicare Other | Admitting: Neurology

## 2020-05-01 ENCOUNTER — Ambulatory Visit (HOSPITAL_COMMUNITY): Payer: Medicare Other

## 2020-05-01 ENCOUNTER — Encounter (HOSPITAL_COMMUNITY): Payer: Self-pay

## 2020-05-02 ENCOUNTER — Ambulatory Visit
Admission: RE | Admit: 2020-05-02 | Discharge: 2020-05-02 | Disposition: A | Discharge status: Home / Self Care | Payer: Medicare Other | Source: Ambulatory Visit | Attending: Hematology & Oncology | Admitting: Hematology & Oncology

## 2020-05-02 ENCOUNTER — Other Ambulatory Visit: Payer: Self-pay

## 2020-05-02 DIAGNOSIS — J439 Emphysema, unspecified: Secondary | ICD-10-CM | POA: Insufficient documentation

## 2020-05-02 DIAGNOSIS — Y939 Activity, unspecified: Secondary | ICD-10-CM | POA: Diagnosis not present

## 2020-05-02 DIAGNOSIS — I700 Atherosclerosis of aorta: Secondary | ICD-10-CM | POA: Insufficient documentation

## 2020-05-02 DIAGNOSIS — K449 Diaphragmatic hernia without obstruction or gangrene: Secondary | ICD-10-CM | POA: Diagnosis not present

## 2020-05-02 DIAGNOSIS — C3490 Malignant neoplasm of unspecified part of unspecified bronchus or lung: Secondary | ICD-10-CM | POA: Diagnosis not present

## 2020-05-02 DIAGNOSIS — Y929 Unspecified place or not applicable: Secondary | ICD-10-CM | POA: Diagnosis not present

## 2020-05-02 DIAGNOSIS — S2241XA Multiple fractures of ribs, right side, initial encounter for closed fracture: Secondary | ICD-10-CM | POA: Diagnosis not present

## 2020-05-02 LAB — GLUCOSE, CAPILLARY: Glucose-Capillary: 92 mg/dL (ref 70–99)

## 2020-05-02 MED ORDER — FLUDEOXYGLUCOSE F - 18 (FDG) INJECTION
5.6000 | Freq: Once | INTRAVENOUS | Status: AC | PRN
  Administered 2020-05-02: 5.49 via INTRAVENOUS

## 2020-05-04 ENCOUNTER — Encounter: Payer: Self-pay | Admitting: Hematology & Oncology

## 2020-05-04 ENCOUNTER — Inpatient Hospital Stay: Payer: Medicare Other | Attending: Hematology

## 2020-05-04 ENCOUNTER — Other Ambulatory Visit: Payer: Self-pay

## 2020-05-04 ENCOUNTER — Inpatient Hospital Stay: Payer: Medicare Other

## 2020-05-04 ENCOUNTER — Inpatient Hospital Stay (HOSPITAL_BASED_OUTPATIENT_CLINIC_OR_DEPARTMENT_OTHER): Payer: Medicare Other | Admitting: Hematology & Oncology

## 2020-05-04 VITALS — BP 152/60 | HR 96 | Temp 98.5°F | Resp 20 | Wt 105.0 lb

## 2020-05-04 DIAGNOSIS — C3491 Malignant neoplasm of unspecified part of right bronchus or lung: Secondary | ICD-10-CM

## 2020-05-04 DIAGNOSIS — R0781 Pleurodynia: Secondary | ICD-10-CM | POA: Diagnosis not present

## 2020-05-04 DIAGNOSIS — C3431 Malignant neoplasm of lower lobe, right bronchus or lung: Secondary | ICD-10-CM | POA: Diagnosis not present

## 2020-05-04 DIAGNOSIS — C3490 Malignant neoplasm of unspecified part of unspecified bronchus or lung: Secondary | ICD-10-CM

## 2020-05-04 DIAGNOSIS — Z87312 Personal history of (healed) stress fracture: Secondary | ICD-10-CM | POA: Diagnosis not present

## 2020-05-04 DIAGNOSIS — Z5112 Encounter for antineoplastic immunotherapy: Secondary | ICD-10-CM | POA: Diagnosis not present

## 2020-05-04 LAB — CMP (CANCER CENTER ONLY)
ALT: 19 U/L (ref 0–44)
AST: 9 U/L — ABNORMAL LOW (ref 15–41)
Albumin: 4.2 g/dL (ref 3.5–5.0)
Alkaline Phosphatase: 63 U/L (ref 38–126)
Anion gap: 8 (ref 5–15)
BUN: 18 mg/dL (ref 8–23)
CO2: 26 mmol/L (ref 22–32)
Calcium: 9.5 mg/dL (ref 8.9–10.3)
Chloride: 94 mmol/L — ABNORMAL LOW (ref 98–111)
Creatinine: 0.55 mg/dL (ref 0.44–1.00)
GFR, Estimated: >60 mL/min (ref >60)
Glucose, Bld: 95 mg/dL (ref 70–99)
Potassium: 3.9 mmol/L (ref 3.5–5.1)
Sodium: 128 mmol/L — ABNORMAL LOW (ref 135–145)
Total Bilirubin: 0.4 mg/dL (ref 0.3–1.2)
Total Protein: 6.7 g/dL (ref 6.5–8.1)

## 2020-05-04 LAB — CBC WITH DIFFERENTIAL (CANCER CENTER ONLY)
Abs Immature Granulocytes: 0.01 10*3/uL (ref 0.00–0.07)
Basophils Absolute: 0 10*3/uL (ref 0.0–0.1)
Basophils Relative: 1 %
Eosinophils Absolute: 0.1 10*3/uL (ref 0.0–0.5)
Eosinophils Relative: 3 %
HCT: 35.2 % — ABNORMAL LOW (ref 36.0–46.0)
Hemoglobin: 12.5 g/dL (ref 12.0–15.0)
Immature Granulocytes: 0 %
Lymphocytes Relative: 19 %
Lymphs Abs: 0.7 10*3/uL (ref 0.7–4.0)
MCH: 32.4 pg (ref 26.0–34.0)
MCHC: 35.5 g/dL (ref 30.0–36.0)
MCV: 91.2 fL (ref 80.0–100.0)
Monocytes Absolute: 0.6 10*3/uL (ref 0.1–1.0)
Monocytes Relative: 15 %
Neutro Abs: 2.3 10*3/uL (ref 1.7–7.7)
Neutrophils Relative %: 62 %
Platelet Count: 231 10*3/uL (ref 150–400)
RBC: 3.86 MIL/uL — ABNORMAL LOW (ref 3.87–5.11)
RDW: 12.9 % (ref 11.5–15.5)
WBC Count: 3.7 10*3/uL — ABNORMAL LOW (ref 4.0–10.5)
nRBC: 0 % (ref 0.0–0.2)

## 2020-05-04 LAB — LACTATE DEHYDROGENASE: LDH: 131 U/L (ref 98–192)

## 2020-05-04 MED ORDER — KETOROLAC TROMETHAMINE 15 MG/ML IJ SOLN
INTRAMUSCULAR | Status: AC
  Filled 2020-05-04: qty 2

## 2020-05-04 MED ORDER — SODIUM CHLORIDE 0.9 % IV SOLN
1500.0000 mg | Freq: Once | INTRAVENOUS | Status: AC
  Administered 2020-05-04: 1500 mg via INTRAVENOUS
  Filled 2020-05-04: qty 30

## 2020-05-04 MED ORDER — SODIUM CHLORIDE 0.9% FLUSH
10.0000 mL | INTRAVENOUS | Status: DC | PRN
  Administered 2020-05-04: 10 mL
  Filled 2020-05-04: qty 10

## 2020-05-04 MED ORDER — SODIUM CHLORIDE 0.9 % IV SOLN
Freq: Once | INTRAVENOUS | Status: AC
Start: 2020-05-04 — End: 2020-05-04
  Filled 2020-05-04: qty 250

## 2020-05-04 MED ORDER — HEPARIN SOD (PORK) LOCK FLUSH 100 UNIT/ML IV SOLN
500.0000 [IU] | Freq: Once | INTRAVENOUS | Status: AC | PRN
  Administered 2020-05-04: 500 [IU]
  Filled 2020-05-04: qty 5

## 2020-05-04 MED ORDER — DENOSUMAB 120 MG/1.7ML ~~LOC~~ SOLN
120.0000 mg | Freq: Once | SUBCUTANEOUS | Status: AC
  Administered 2020-05-04: 120 mg via SUBCUTANEOUS

## 2020-05-04 MED ORDER — DENOSUMAB 120 MG/1.7ML ~~LOC~~ SOLN
SUBCUTANEOUS | Status: AC
  Filled 2020-05-04: qty 1.7

## 2020-05-04 MED ORDER — KETOROLAC TROMETHAMINE 30 MG/ML IJ SOLN
30.0000 mg | Freq: Once | INTRAMUSCULAR | Status: AC
  Administered 2020-05-04: 30 mg via INTRAVENOUS
  Filled 2020-05-04: qty 1

## 2020-05-04 NOTE — Patient Instructions (Signed)
Durvalumab injection What is this medicine? DURVALUMAB (dur VAL ue mab) is a monoclonal antibody. It is used to treat lung cancer. This medicine may be used for other purposes; ask your health care provider or pharmacist if you have questions. COMMON BRAND NAME(S): IMFINZI What should I tell my health care provider before I take this medicine? They need to know if you have any of these conditions:  autoimmune diseases like Crohn's disease, ulcerative colitis, or lupus  have had or planning to have an allogeneic stem cell transplant (uses someone else's stem cells)  history of organ transplant  history of radiation to the chest  nervous system problems like myasthenia gravis or Guillain-Barre syndrome  an unusual or allergic reaction to durvalumab, other medicines, foods, dyes, or preservatives  pregnant or trying to get pregnant  breast-feeding How should I use this medicine? This medicine is for infusion into a vein. It is given by a health care professional in a hospital or clinic setting. A special MedGuide will be given to you before each treatment. Be sure to read this information carefully each time. Talk to your pediatrician regarding the use of this medicine in children. Special care may be needed. Overdosage: If you think you have taken too much of this medicine contact a poison control center or emergency room at once. NOTE: This medicine is only for you. Do not share this medicine with others. What if I miss a dose? It is important not to miss your dose. Call your doctor or health care professional if you are unable to keep an appointment. What may interact with this medicine? Interactions have not been studied. This list may not describe all possible interactions. Give your health care provider a list of all the medicines, herbs, non-prescription drugs, or dietary supplements you use. Also tell them if you smoke, drink alcohol, or use illegal drugs. Some items may  interact with your medicine. What should I watch for while using this medicine? This drug may make you feel generally unwell. Continue your course of treatment even though you feel ill unless your doctor tells you to stop. You may need blood work done while you are taking this medicine. Do not become pregnant while taking this medicine or for 3 months after stopping it. Women should inform their doctor if they wish to become pregnant or think they might be pregnant. There is a potential for serious side effects to an unborn child. Talk to your health care professional or pharmacist for more information. Do not breast-feed an infant while taking this medicine or for 3 months after stopping it. What side effects may I notice from receiving this medicine? Side effects that you should report to your doctor or health care professional as soon as possible:  allergic reactions like skin rash, itching or hives, swelling of the face, lips, or tongue  black, tarry stools  bloody or watery diarrhea  breathing problems  change in emotions or moods  change in sex drive  changes in vision  chest pain or chest tightness  chills  confusion  cough  facial flushing  fever  headache  signs and symptoms of high blood sugar such as dizziness; dry mouth; dry skin; fruity breath; nausea; stomach pain; increased hunger or thirst; increased urination  signs and symptoms of liver injury like dark yellow or brown urine; general ill feeling or flu-like symptoms; light-colored stools; loss of appetite; nausea; right upper belly pain; unusually weak or tired; yellowing of the eyes or skin  stomach pain  trouble passing urine or change in the amount of urine  weight gain or weight loss Side effects that usually do not require medical attention (report these to your doctor or health care professional if they continue or are bothersome):  bone pain  constipation  loss of appetite  muscle  pain  nausea  swelling of the ankles, feet, hands  tiredness This list may not describe all possible side effects. Call your doctor for medical advice about side effects. You may report side effects to FDA at 1-800-FDA-1088. Where should I keep my medicine? This drug is given in a hospital or clinic and will not be stored at home. NOTE: This sheet is a summary. It may not cover all possible information. If you have questions about this medicine, talk to your doctor, pharmacist, or health care provider.  2021 Elsevier/Gold Standard (2019-04-08 13:01:29)

## 2020-05-04 NOTE — Patient Instructions (Signed)

## 2020-05-04 NOTE — Progress Notes (Signed)
Hematology and Oncology Follow Up Visit  [name] [id number] 01-26-1946 75 y.o. 05/04/2020   Principle Diagnosis:  Stage IIIA (T4N0M0) squamous cell carcinoma the right lower lung  Past Therapy: Patient completed definitive chemoradiation therapy with weekly carboplatinum /Taxol - completed on 07/16/2019  Current Therapy:        Durvalumab -- maintenance -- s/p cycle #7   Interim History:  [name] is here today for follow-up.  The problem that she is now having that she is [name] a lot of pain where she has a rib fracture is over on her right side.  These are not pathologic fracture is.  She was told that they were caused by radiation.  I am surprised that radiation would even cause a fracture.  She just is bothered by the pain over there.  We did give her some Ultram.  She cannot take Ultram because of constipation.  She has a lidocaine patch which she is using.  We might want to try Xgeva.  This might help a little bit.  Hopefully, insurance will cover this for [place].  We did do a PET scan on her.  Thankfully, the PET scan does show that she is still responding to the treatments.  Her last PET scan which was done a week ago showed that there was still shrinkage and decrease in activity in the right lung nodule.  She is currently on maintenance therapy with Durvalumab.  She is tolerated this pretty nicely.  She just cannot do all that much because of the pain from the ribs.  She has had no issues with nausea or vomiting.  She has had no leg swelling.  If the rib pain can get better, she will clearly be doing better.  This is really what is controlling her life right now.  Overall, I would have to say that her performance status is by ECOG 1.    Medications:  Allergies as of 05/04/2020      Reactions   Bee Venom Swelling   Timolol Nausea Only   Codeine Other (See Comments)   Brimonidine Nausea Only   Hydrocodone Itching, Rash      Medication List       Accurate  as of May 04, 2020  1:29 PM. If you have any questions, ask your nurse or doctor.        STOP taking these medications   lactulose 10 GM/15ML solution Commonly known as: CHRONULAC Stopped by: [name], MD   polyethylene glycol 17 g packet Commonly known as: MIRALAX / GLYCOLAX Stopped by: [name], MD   traMADol 50 MG tablet Commonly known as: ULTRAM Stopped by: [name], MD     TAKE these medications   amoxicillin 500 MG capsule Commonly known as: AMOXIL Take 4 capsules (2000 mg) one hour prior to dental appointment.   chlorpheniramine-HYDROcodone 10-8 MG/5ML Suer Commonly known as: TUSSIONEX Take 5 mLs by mouth every 12 (twelve) hours as needed for cough.   diphenhydrAMINE 25 MG tablet Commonly known as: BENADRYL Take 50 mg by mouth daily as needed (allergic reaction).   dorzolamide 2 % ophthalmic solution Commonly known as: TRUSOPT Place 1 drop into both eyes 2 (two) times daily.   famotidine 10 MG tablet Commonly known as: PEPCID Take 10 mg by mouth daily as needed (take with benadryl when stung).   levothyroxine 100 MCG tablet Commonly known as: SYNTHROID Take 100 mcg by mouth every morning.   lidocaine 5 % Commonly  known as: [place] 1 patch onto the skin daily. Remove & Discard patch within 12 hours or as directed by MD   lidocaine-prilocaine cream Commonly known as: EMLA Apply to affected area once   LORazepam 0.5 MG tablet Commonly known as: Ativan Take 1 tablet (0.5 mg total) by mouth every 6 (six) hours as needed (Nausea or vomiting).   ondansetron 8 MG tablet Commonly known as: Zofran Take 1 tablet (8 mg total) by mouth 2 (two) times daily as needed (Nausea or vomiting).   prochlorperazine 10 MG tablet Commonly known as: COMPAZINE Take 1 tablet (10 mg total) by mouth every 6 (six) hours as needed (Nausea or vomiting).   sennosides-docusate sodium 8.6-50 MG tablet Commonly known as: SENOKOT-S Take 2 tablets by  mouth daily.   sucralfate 1 g tablet Commonly known as: CARAFATE Take 1 tablet (1 g total) by mouth 4 (four) times daily -  with meals and at bedtime. As needed   travoprost (benzalkonium) 0.004 % ophthalmic solution Commonly known as: TRAVATAN Place 1 drop into both eyes at bedtime.   valACYclovir 500 MG tablet Commonly known as: VALTREX Take 1 tablet (500 mg total) by mouth daily.   Vitamin D 50 MCG (2000 UT) tablet Take 2,000 Units by mouth daily with supper.       Allergies:  Allergies  Allergen Reactions  . Bee Venom Swelling  . Timolol Nausea Only  . Codeine Other (See Comments)  . Brimonidine Nausea Only  . Hydrocodone Itching and Rash    Past Medical History, Surgical history, Social history, and Family History were reviewed and updated.  Review of Systems: Review of Systems  Constitutional: Positive for malaise/fatigue.  HENT: Negative.   Eyes: Positive for redness.  Respiratory: Positive for cough and sputum production.   Cardiovascular: Negative.   Gastrointestinal: Negative.   Genitourinary: Negative.   Musculoskeletal: Negative.   Skin: Negative.   Neurological: Positive for tingling and focal weakness.  Endo/Heme/Allergies: Negative.   Psychiatric/Behavioral: Negative.      Physical Exam:  weight is 105 lb (47.6 kg). Her oral temperature is 98.5 F (36.9 C). Her blood pressure is 152/60 (abnormal) and her pulse is 96. Her respiration is 20 and oxygen saturation is 97%.   Wt Readings from Last 3 Encounters:  05/04/20 105 lb (47.6 kg)  04/06/20 108 lb (49 kg)  03/09/20 110 lb (49.9 kg)  Maybe weekly for labs  Physical Exam Vitals reviewed.  HENT:     Head: Normocephalic and atraumatic.  Eyes:     Pupils: Pupils are equal, round, and reactive to light.  Cardiovascular:     Rate and Rhythm: Normal rate and regular rhythm.     Heart sounds: Normal heart sounds.  Pulmonary:     Effort: Pulmonary effort is normal.     Breath sounds: Normal  breath sounds.     Comments: Lungs show distant breath sounds bilaterally.  She has good air movement bilaterally.  I do not hear any wheezes.  She has no decreased sounds to percussion. Abdominal:     General: Bowel sounds are normal.     Palpations: Abdomen is soft.  Musculoskeletal:        General: No tenderness or deformity. Normal range of motion.     Cervical back: Normal range of motion.  Lymphadenopathy:     Cervical: No cervical adenopathy.  Skin:    General: Skin is warm and dry.     Findings: No erythema or rash.  Neurological:  Mental Status: She is alert and oriented to person, place, and time.  Psychiatric:        Behavior: Behavior normal.        Thought Content: Thought content normal.        Judgment: Judgment normal.       Lab Results  Component Value Date   WBC 3.7 (L) 05/04/2020   HGB 12.5 05/04/2020   HCT 35.2 (L) 05/04/2020   MCV 91.2 05/04/2020   PLT 231 05/04/2020   Lab Results  Component Value Date   FERRITIN 311 (H) 05/25/2019   IRON 22 (L) 05/25/2019   TIBC 288 05/25/2019   UIBC 265 05/25/2019   IRONPCTSAT 8 (L) 05/25/2019   Lab Results  Component Value Date   RBC 3.86 (L) 05/04/2020   No results found for: KPAFRELGTCHN, LAMBDASER, KAPLAMBRATIO No results found for: IGGSERUM, IGA, IGMSERUM No results found for: TOTALPROTELP, ALBUMINELP, A1GS, A2GS, BETS, BETA2SER, GAMS, MSPIKE, SPEI   Chemistry      Component Value Date/Time   NA 128 (L) 05/04/2020 1000   K 3.9 05/04/2020 1000   CL 94 (L) 05/04/2020 1000   CO2 26 05/04/2020 1000   BUN 18 05/04/2020 1000   CREATININE 0.55 05/04/2020 1000      Component Value Date/Time   CALCIUM 9.5 05/04/2020 1000   ALKPHOS 63 05/04/2020 1000   AST 9 (L) 05/04/2020 1000   ALT 19 05/04/2020 1000   BILITOT 0.4 05/04/2020 1000       Impression and Plan: [name] is a very nice 75 yo caucasian female with locally advanced stage IIIa non-small cell carcinoma the right lower lung.    She  received chemotherapy and radiation therapy with a very nice response.  She now is on maintenance Durvalumab.  Hopefully, this rib pain will get better.  I will have her take some IV Toradol while she is in the office today.  This might help a little bit.  Again, we will try her on the Xgeva.  Hopefully this might give [place] a little bit more of an effect.  I will see her back in another month.  This I think will be helpful.  I do not think that we have to do any scans on her probably for about 3-4 months.   [name], MD 3/24/20221:29 PM

## 2020-05-09 ENCOUNTER — Other Ambulatory Visit: Payer: Self-pay | Admitting: *Deleted

## 2020-05-09 ENCOUNTER — Telehealth: Payer: Self-pay | Admitting: *Deleted

## 2020-05-09 MED ORDER — TRAMADOL HCL 50 MG PO TABS: 50.0000 mg | ORAL_TABLET | Freq: Four times a day (QID) | ORAL | 1 refills | Status: DC | PRN

## 2020-05-09 NOTE — Telephone Encounter (Signed)
Returned patient's phone call regarding pain medicine. She stated,"I have been taking CBD oil and two Tylenol for fractured ribs. It is not helping. Dr. Marin Olp called me in Tramadol but that causes me to have constipation. Can he call me in another pain medicine?" I informed her that all pain medications can cause constipation. You need to be taking a stool softener and laxative. She takes Senokot-S and Miralax daily. She verbalized understanding.

## 2020-05-09 NOTE — Telephone Encounter (Signed)
Message received from patient stating that she is taking CBD with two Tylenol, which is not helping her rib pain and would like to know what else she can take.  Call placed back to patient and patient notified per order of Dr. Marin Olp that he will send in a prescription for Tramadol.  Pt is appreciative of call back and has no other questions at this time.

## 2020-05-10 DIAGNOSIS — R0781 Pleurodynia: Secondary | ICD-10-CM | POA: Diagnosis not present

## 2020-05-12 DIAGNOSIS — R0781 Pleurodynia: Secondary | ICD-10-CM | POA: Diagnosis not present

## 2020-05-17 DIAGNOSIS — S2241XA Multiple fractures of ribs, right side, initial encounter for closed fracture: Secondary | ICD-10-CM | POA: Diagnosis not present

## 2020-05-29 DIAGNOSIS — S2241XA Multiple fractures of ribs, right side, initial encounter for closed fracture: Secondary | ICD-10-CM | POA: Diagnosis not present

## 2020-05-31 ENCOUNTER — Other Ambulatory Visit: Payer: Self-pay | Admitting: Hematology & Oncology

## 2020-05-31 DIAGNOSIS — C3491 Malignant neoplasm of unspecified part of right bronchus or lung: Secondary | ICD-10-CM

## 2020-05-31 DIAGNOSIS — R1011 Right upper quadrant pain: Secondary | ICD-10-CM | POA: Diagnosis not present

## 2020-05-31 DIAGNOSIS — K5900 Constipation, unspecified: Secondary | ICD-10-CM | POA: Diagnosis not present

## 2020-05-31 DIAGNOSIS — R109 Unspecified abdominal pain: Secondary | ICD-10-CM | POA: Diagnosis not present

## 2020-06-01 ENCOUNTER — Inpatient Hospital Stay: Payer: Medicare Other

## 2020-06-01 ENCOUNTER — Encounter: Payer: Self-pay | Admitting: Hematology & Oncology

## 2020-06-01 ENCOUNTER — Other Ambulatory Visit: Payer: Self-pay

## 2020-06-01 ENCOUNTER — Inpatient Hospital Stay (HOSPITAL_BASED_OUTPATIENT_CLINIC_OR_DEPARTMENT_OTHER): Payer: Medicare Other | Admitting: Hematology & Oncology

## 2020-06-01 ENCOUNTER — Inpatient Hospital Stay: Payer: Medicare Other | Attending: Hematology

## 2020-06-01 VITALS — BP 112/62 | HR 79 | Temp 98.7°F | Resp 16 | Ht 59.06 in | Wt 101.0 lb

## 2020-06-01 DIAGNOSIS — R109 Unspecified abdominal pain: Secondary | ICD-10-CM | POA: Diagnosis not present

## 2020-06-01 DIAGNOSIS — C3431 Malignant neoplasm of lower lobe, right bronchus or lung: Secondary | ICD-10-CM | POA: Insufficient documentation

## 2020-06-01 DIAGNOSIS — C3491 Malignant neoplasm of unspecified part of right bronchus or lung: Secondary | ICD-10-CM

## 2020-06-01 DIAGNOSIS — R110 Nausea: Secondary | ICD-10-CM

## 2020-06-01 DIAGNOSIS — K5900 Constipation, unspecified: Secondary | ICD-10-CM | POA: Insufficient documentation

## 2020-06-01 LAB — CBC WITH DIFFERENTIAL (CANCER CENTER ONLY)
Abs Immature Granulocytes: 0.02 10*3/uL (ref 0.00–0.07)
Basophils Absolute: 0 10*3/uL (ref 0.0–0.1)
Basophils Relative: 0 %
Eosinophils Absolute: 0.1 10*3/uL (ref 0.0–0.5)
Eosinophils Relative: 2 %
HCT: 38.7 % (ref 36.0–46.0)
Hemoglobin: 13.4 g/dL (ref 12.0–15.0)
Immature Granulocytes: 1 %
Lymphocytes Relative: 19 %
Lymphs Abs: 0.7 10*3/uL (ref 0.7–4.0)
MCH: 32.1 pg (ref 26.0–34.0)
MCHC: 34.6 g/dL (ref 30.0–36.0)
MCV: 92.6 fL (ref 80.0–100.0)
Monocytes Absolute: 0.5 10*3/uL (ref 0.1–1.0)
Monocytes Relative: 12 %
Neutro Abs: 2.6 10*3/uL (ref 1.7–7.7)
Neutrophils Relative %: 66 %
Platelet Count: 239 10*3/uL (ref 150–400)
RBC: 4.18 MIL/uL (ref 3.87–5.11)
RDW: 12.4 % (ref 11.5–15.5)
WBC Count: 3.9 10*3/uL — ABNORMAL LOW (ref 4.0–10.5)
nRBC: 0 % (ref 0.0–0.2)

## 2020-06-01 LAB — CMP (CANCER CENTER ONLY)
ALT: 9 U/L (ref 0–44)
AST: 7 U/L — ABNORMAL LOW (ref 15–41)
Albumin: 4.4 g/dL (ref 3.5–5.0)
Alkaline Phosphatase: 68 U/L (ref 38–126)
Anion gap: 8 (ref 5–15)
BUN: 13 mg/dL (ref 8–23)
CO2: 23 mmol/L (ref 22–32)
Calcium: 8.9 mg/dL (ref 8.9–10.3)
Chloride: 100 mmol/L (ref 98–111)
Creatinine: 0.49 mg/dL (ref 0.44–1.00)
GFR, Estimated: >60 mL/min (ref >60)
Glucose, Bld: 138 mg/dL — ABNORMAL HIGH (ref 70–99)
Potassium: 3.8 mmol/L (ref 3.5–5.1)
Sodium: 131 mmol/L — ABNORMAL LOW (ref 135–145)
Total Bilirubin: 0.6 mg/dL (ref 0.3–1.2)
Total Protein: 7 g/dL (ref 6.5–8.1)

## 2020-06-01 LAB — LACTATE DEHYDROGENASE: LDH: 153 U/L (ref 98–192)

## 2020-06-01 MED ORDER — METOCLOPRAMIDE HCL 5 MG/ML IJ SOLN: 10.0000 mg | Freq: Once | INTRAMUSCULAR | Status: DC

## 2020-06-01 MED ORDER — SODIUM CHLORIDE 0.9 % IV SOLN
INTRAVENOUS | Status: AC
  Filled 2020-06-01 (×2): qty 250

## 2020-06-01 MED ORDER — KETOROLAC TROMETHAMINE 15 MG/ML IJ SOLN
INTRAMUSCULAR | Status: AC
  Filled 2020-06-01: qty 2

## 2020-06-01 MED ORDER — KETOROLAC TROMETHAMINE 15 MG/ML IJ SOLN
30.0000 mg | Freq: Once | INTRAMUSCULAR | Status: AC
  Administered 2020-06-01: 30 mg via INTRAVENOUS
  Filled 2020-06-01: qty 2

## 2020-06-01 MED ORDER — METOCLOPRAMIDE HCL 5 MG/ML IJ SOLN
INTRAMUSCULAR | Status: AC
  Filled 2020-06-01: qty 2

## 2020-06-01 MED ORDER — METOCLOPRAMIDE HCL 5 MG/ML IJ SOLN
10.0000 mg | Freq: Once | INTRAMUSCULAR | Status: AC
  Administered 2020-06-01: 10 mg via INTRAVENOUS

## 2020-06-01 NOTE — Patient Instructions (Signed)
Implanted Port Insertion, Care After This sheet gives you information about how to care for yourself after your procedure. Your health care provider may also give you more specific instructions. If you have problems or questions, contact your health care provider. What can I expect after the procedure? After the procedure, it is common to have:  Discomfort at the port insertion site.  Bruising on the skin over the port. This should improve over 3-4 days. Follow these instructions at home: Port care  After your port is placed, you will get a manufacturer's information card. The card has information about your port. Keep this card with you at all times.  Take care of the port as told by your health care provider. Ask your health care provider if you or a family member can get training for taking care of the port at home. A home health care nurse may also take care of the port.  Make sure to remember what type of port you have. Incision care  Follow instructions from your health care provider about how to take care of your port insertion site. Make sure you: ? Wash your hands with soap and water before and after you change your bandage (dressing). If soap and water are not available, use hand sanitizer. ? Change your dressing as told by your health care provider. ? Leave stitches (sutures), skin glue, or adhesive strips in place. These skin closures may need to stay in place for 2 weeks or longer. If adhesive strip edges start to loosen and curl up, you may trim the loose edges. Do not remove adhesive strips completely unless your health care provider tells you to do that.  Check your port insertion site every day for signs of infection. Check for: ? Redness, swelling, or pain. ? Fluid or blood. ? Warmth. ? Pus or a bad smell.      Activity  Return to your normal activities as told by your health care provider. Ask your health care provider what activities are safe for you.  Do not  lift anything that is heavier than 10 lb (4.5 kg), or the limit that you are told, until your health care provider says that it is safe. General instructions  Take over-the-counter and prescription medicines only as told by your health care provider.  Do not take baths, swim, or use a hot tub until your health care provider approves. Ask your health care provider if you may take showers. You may only be allowed to take sponge baths.  Do not drive for 24 hours if you were given a sedative during your procedure.  Wear a medical alert bracelet in case of an emergency. This will tell any health care providers that you have a port.  Keep all follow-up visits as told by your health care provider. This is important. Contact a health care provider if:  You cannot flush your port with saline as directed, or you cannot draw blood from the port.  You have a fever or chills.  You have redness, swelling, or pain around your port insertion site.  You have fluid or blood coming from your port insertion site.  Your port insertion site feels warm to the touch.  You have pus or a bad smell coming from the port insertion site. Get help right away if:  You have chest pain or shortness of breath.  You have bleeding from your port that you cannot control. Summary  Take care of the port as told by your   health care provider. Keep the manufacturer's information card with you at all times.  Change your dressing as told by your health care provider.  Contact a health care provider if you have a fever or chills or if you have redness, swelling, or pain around your port insertion site.  Keep all follow-up visits as told by your health care provider. This information is not intended to replace advice given to you by your health care provider. Make sure you discuss any questions you have with your health care provider. Document Revised: 08/26/2017 Document Reviewed: 08/26/2017 Elsevier Patient Education   2021 Elsevier Inc.  

## 2020-06-01 NOTE — Patient Instructions (Addendum)
2 RNs Verified : Drug appearance and integrity are intact; Drug expiration date and time- drug has not expired; Pump settings are accurate, if applicable   Dehydration, Adult Dehydration is condition in which there is not enough water or other fluids in the body. This happens when a person loses more fluids than he or she takes in. Important body parts cannot work right without the right amount of fluids. Any loss of fluids from the body can cause dehydration. Dehydration can be mild, worse, or very bad. It should be treated right away to keep it from getting very bad. What are the causes? This condition may be caused by:  Conditions that cause loss of water or other fluids, such as: ? Watery poop (diarrhea). ? Vomiting. ? Sweating a lot. ? Peeing (urinating) a lot.  Not drinking enough fluids, especially when you: ? Are ill. ? Are doing things that take a lot of energy to do.  Other illnesses and conditions, such as fever or infection.  Certain medicines, such as medicines that take extra fluid out of the body (diuretics).  Lack of safe drinking water.  Not being able to get enough water and food. What increases the risk? The following factors may make you more likely to develop this condition:  Having a long-term (chronic) illness that has not been treated the right way, such as: ? Diabetes. ? Heart disease. ? Kidney disease.  Being 30 years of age or older.  Having a disability.  Living in a place that is high above the ground or sea (high in altitude). The thinner, dried air causes more fluid loss.  Doing exercises that put stress on your body for a long time. What are the signs or symptoms? Symptoms of dehydration depend on how bad it is. Mild or worse dehydration  Thirst.  Dry lips or dry mouth.  Feeling dizzy or light-headed, especially when you stand up from sitting.  Muscle cramps.  Your body making: ? Dark pee (urine). Pee may be the color of  tea. ? Less pee than normal. ? Less tears than normal.  Headache. Very bad dehydration  Changes in skin. Skin may: ? Be cold to the touch (clammy). ? Be blotchy or pale. ? Not go back to normal right after you lightly pinch it and let it go.  Little or no tears, pee, or sweat.  Changes in vital signs, such as: ? Fast breathing. ? Low blood pressure. ? Weak pulse. ? Pulse that is more than 100 beats a minute when you are sitting still.  Other changes, such as: ? Feeling very thirsty. ? Eyes that look hollow (sunken). ? Cold hands and feet. ? Being mixed up (confused). ? Being very tired (lethargic) or having trouble waking from sleep. ? Short-term weight loss. ? Loss of consciousness. How is this treated? Treatment for this condition depends on how bad it is. Treatment should start right away. Do not wait until your condition gets very bad. Very bad dehydration is an emergency. You will need to go to a hospital.  Mild or worse dehydration can be treated at home. You may be asked to: ? Drink more fluids. ? Drink an oral rehydration solution (ORS). This drink helps get the right amounts of fluids and salts and minerals in the blood (electrolytes).  Very bad dehydration can be treated: ? With fluids through an IV tube. ? By getting normal levels of salts and minerals in your blood. This is often done by giving  salts and minerals through a tube. The tube is passed through your nose and into your stomach. ? By treating the root cause. Follow these instructions at home: Oral rehydration solution If told by your doctor, drink an ORS:  Make an ORS. Use instructions on the package.  Start by drinking small amounts, about  cup (120 mL) every 5-10 minutes.  Slowly drink more until you have had the amount that your doctor said to have. Eating and drinking  Drink enough clear fluid to keep your pee pale yellow. If you were told to drink an ORS, finish the ORS first. Then, start  slowly drinking other clear fluids. Drink fluids such as: ? Water. Do not drink only water. Doing that can make the salt (sodium) level in your body get too low. ? Water from ice chips you suck on. ? Fruit juice that you have added water to (diluted). ? Low-calorie sports drinks.  Eat foods that have the right amounts of salts and minerals, such as: ? Bananas. ? Oranges. ? Potatoes. ? Tomatoes. ? Spinach.  Do not drink alcohol.  Avoid: ? Drinks that have a lot of sugar. These include:  High-calorie sports drinks.  Fruit juice that you did not add water to.  Soda.  Caffeine. ? Foods that are greasy or have a lot of fat or sugar.         General instructions  Take over-the-counter and prescription medicines only as told by your doctor.  Do not take salt tablets. Doing that can make the salt level in your body get too high.  Return to your normal activities as told by your doctor. Ask your doctor what activities are safe for you.  Keep all follow-up visits as told by your doctor. This is important. Contact a doctor if:  You have pain in your belly (abdomen) and the pain: ? Gets worse. ? Stays in one place.  You have a rash.  You have a stiff neck.  You get angry or annoyed (irritable) more easily than normal.  You are more tired or have a harder time waking than normal.  You feel: ? Weak or dizzy. ? Very thirsty. Get help right away if you have:  Any symptoms of very bad dehydration.  Symptoms of vomiting, such as: ? You cannot eat or drink without vomiting. ? Your vomiting gets worse or does not go away. ? Your vomit has blood or green stuff in it.  Symptoms that get worse with treatment.  A fever.  A very bad headache.  Problems with peeing or pooping (having a bowel movement), such as: ? Watery poop that gets worse or does not go away. ? Blood in your poop (stool). This may cause poop to look black and tarry. ? Not peeing in 6-8  hours. ? Peeing only a small amount of very dark pee in 6-8 hours.  Trouble breathing. These symptoms may be an emergency. Do not wait to see if the symptoms will go away. Get medical help right away. Call your local emergency services (911 in the [place] Do not drive yourself to the hospital. Summary  Dehydration is a condition in which there is not enough water or other fluids in the body. This happens when a person loses more fluids than he or she takes in.  Treatment for this condition depends on how bad it is. Treatment should be started right away. Do not wait until your condition gets very bad.  Drink enough clear fluid to  keep your pee pale yellow. If you were told to drink an oral rehydration solution (ORS), finish the ORS first. Then, start slowly drinking other clear fluids.  Take over-the-counter and prescription medicines only as told by your doctor.  Get help right away if you have any symptoms of very bad dehydration. This information is not intended to replace advice given to you by your health care provider. Make sure you discuss any questions you have with your health care provider. Document Revised: 09/10/2018 Document Reviewed: 09/10/2018 Elsevier Patient Education  [place].

## 2020-06-01 NOTE — Progress Notes (Signed)
Hematology and Oncology Follow Up Visit  [name] [id number] 05-28-1945 75 y.o. 06/01/2020   Principle Diagnosis:  Stage IIIA (T4N0M0) squamous cell carcinoma the right lower lung  Past Therapy: Patient completed definitive chemoradiation therapy with weekly carboplatinum /Taxol - completed on 07/16/2019  Current Therapy:        Durvalumab -- maintenance -- s/p cycle #7   Interim History:  [name] is here today for follow-up.  She is still having a lot of trouble with the pain in her right side.  She saw an orthopedic spine doctor.  He did some spinal injections.  This helped a little bit.  We started her on Xgeva.  She is still having problems with pain.  She also is having problems with constipation now.  She is having some right-sided abdominal pain.  She said that her doctor told her that she had a lot of gas in that area.  He gave her some GoLytely and hopefully this might help make her go.  She is really not hungry because of the discomfort.  I really think we have to hold on her Durvalumab right now.  I think that we need to give her a few weeks off from this.  I will know how much this might be contributing to some of her issues.  I just want her to have a good quality of life.  I want to be able to enjoy herself.  I want her to be able to enjoy Mother's Day coming up.  She has had no cough.  She has had no leg swelling.  Overall, I would say her performance status is ECOG 1.    Medications:  Allergies as of 06/01/2020      Reactions   Bee Venom Swelling   Timolol Nausea Only   Codeine Other (See Comments)   Brimonidine Nausea Only   Hydrocodone Itching, Rash      Medication List       Accurate as of June 01, 2020  9:48 AM. If you have any questions, ask your nurse or doctor.        STOP taking these medications   chlorpheniramine-HYDROcodone 10-8 MG/5ML Suer Commonly known as: TUSSIONEX Stopped by: [name], MD   sucralfate 1 g  tablet Commonly known as: CARAFATE Stopped by: [name], MD   valACYclovir 500 MG tablet Commonly known as: VALTREX Stopped by: [name], MD     TAKE these medications   amoxicillin 500 MG capsule Commonly known as: AMOXIL Take 4 capsules (2000 mg) one hour prior to dental appointment.   diphenhydrAMINE 25 MG tablet Commonly known as: BENADRYL Take 50 mg by mouth daily as needed (allergic reaction).   dorzolamide 2 % ophthalmic solution Commonly known as: TRUSOPT Place 1 drop into both eyes 2 (two) times daily.   famotidine 10 MG tablet Commonly known as: PEPCID Take 10 mg by mouth daily as needed (take with benadryl when stung).   gabapentin 300 MG capsule Commonly known as: NEURONTIN Take 1 capsule by mouth at bedtime.   levothyroxine 100 MCG tablet Commonly known as: SYNTHROID Take 100 mcg by mouth every morning.   lidocaine 5 % Commonly known as: LIDODERM Place 1 patch onto the skin daily. Remove & Discard patch within 12 hours or as directed by MD   lidocaine-prilocaine cream Commonly known as: EMLA Apply to affected area once   LORazepam 0.5 MG tablet Commonly known as: ATIVAN TAKE 1 TABLET(0.5 MG) BY MOUTH  EVERY 6 HOURS AS NEEDED FOR NAUSEA OR VOMITING   ondansetron 8 MG tablet Commonly known as: Zofran Take 1 tablet (8 mg total) by mouth 2 (two) times daily as needed (Nausea or vomiting).   prochlorperazine 10 MG tablet Commonly known as: COMPAZINE Take 1 tablet (10 mg total) by mouth every 6 (six) hours as needed (Nausea or vomiting).   sennosides-docusate sodium 8.6-50 MG tablet Commonly known as: SENOKOT-S Take 2 tablets by mouth daily.   traMADol 50 MG tablet Commonly known as: ULTRAM Take 1 tablet (50 mg total) by mouth every 6 (six) hours as needed.   travoprost (benzalkonium) 0.004 % ophthalmic solution Commonly known as: TRAVATAN Place 1 drop into both eyes at bedtime.   Vitamin D 50 MCG (2000 UT) tablet Take 2,000 Units  by mouth daily with supper.       Allergies:  Allergies  Allergen Reactions  . Bee Venom Swelling  . Timolol Nausea Only  . Codeine Other (See Comments)  . Brimonidine Nausea Only  . Hydrocodone Itching and Rash    Past Medical History, Surgical history, Social history, and Family History were reviewed and updated.  Review of Systems: Review of Systems  Constitutional: Positive for malaise/fatigue.  HENT: Negative.   Eyes: Positive for redness.  Respiratory: Positive for cough and sputum production.   Cardiovascular: Negative.   Gastrointestinal: Negative.   Genitourinary: Negative.   Musculoskeletal: Negative.   Skin: Negative.   Neurological: Positive for tingling and focal weakness.  Endo/Heme/Allergies: Negative.   Psychiatric/Behavioral: Negative.      Physical Exam:  height is 4' 11.06" (1.5 m) and weight is 101 lb 0.6 oz (45.8 kg). Her oral temperature is 98.7 F (37.1 C). Her blood pressure is 112/62 and her pulse is 79. Her respiration is 16 and oxygen saturation is 100%.   Wt Readings from Last 3 Encounters:  06/01/20 101 lb 0.6 oz (45.8 kg)  06/01/20 101 lb (45.8 kg)  05/04/20 105 lb (47.6 kg)  Maybe weekly for labs  Physical Exam Vitals reviewed.  HENT:     Head: Normocephalic and atraumatic.  Eyes:     Pupils: Pupils are equal, round, and reactive to light.  Cardiovascular:     Rate and Rhythm: Normal rate and regular rhythm.     Heart sounds: Normal heart sounds.  Pulmonary:     Effort: Pulmonary effort is normal.     Breath sounds: Normal breath sounds.     Comments: Lungs show distant breath sounds bilaterally.  She has good air movement bilaterally.  I do not hear any wheezes.  She has no decreased sounds to percussion. Abdominal:     General: Bowel sounds are normal.     Palpations: Abdomen is soft.  Musculoskeletal:        General: No tenderness or deformity. Normal range of motion.     Cervical back: Normal range of motion.   Lymphadenopathy:     Cervical: No cervical adenopathy.  Skin:    General: Skin is warm and dry.     Findings: No erythema or rash.  Neurological:     Mental Status: She is alert and oriented to person, place, and time.  Psychiatric:        Behavior: Behavior normal.        Thought Content: Thought content normal.        Judgment: Judgment normal.       Lab Results  Component Value Date   WBC 3.9 (L) 06/01/2020  HGB 13.4 06/01/2020   HCT 38.7 06/01/2020   MCV 92.6 06/01/2020   PLT 239 06/01/2020   Lab Results  Component Value Date   FERRITIN 311 (H) 05/25/2019   IRON 22 (L) 05/25/2019   TIBC 288 05/25/2019   UIBC 265 05/25/2019   IRONPCTSAT 8 (L) 05/25/2019   Lab Results  Component Value Date   RBC 4.18 06/01/2020   No results found for: KPAFRELGTCHN, LAMBDASER, KAPLAMBRATIO No results found for: IGGSERUM, IGA, IGMSERUM No results found for: TOTALPROTELP, ALBUMINELP, A1GS, A2GS, BETS, BETA2SER, GAMS, MSPIKE, SPEI   Chemistry      Component Value Date/Time   NA 128 (L) 05/04/2020 1000   K 3.9 05/04/2020 1000   CL 94 (L) 05/04/2020 1000   CO2 26 05/04/2020 1000   BUN 18 05/04/2020 1000   CREATININE 0.55 05/04/2020 1000      Component Value Date/Time   CALCIUM 9.5 05/04/2020 1000   ALKPHOS 63 05/04/2020 1000   AST 9 (L) 05/04/2020 1000   ALT 19 05/04/2020 1000   BILITOT 0.4 05/04/2020 1000       Impression and Plan: Ms. Barb is a very nice 75 yo caucasian female with locally advanced stage IIIa non-small cell carcinoma the right lower lung.    She received chemotherapy and radiation therapy with a very nice response.  She now is on maintenance Durvalumab.  Again, we are going to give her a few weeks off.  I really think she would benefit from this.  I will give her some IV fluids today.  I will give her some IV Reglan and some IV Toradol.  Again, we are holding the Durvalumab for right now.  I will plan to see her back in 3 weeks    [name], MD 4/21/20229:48 AM

## 2020-06-05 ENCOUNTER — Telehealth: Payer: Self-pay

## 2020-06-05 NOTE — Telephone Encounter (Signed)
Called pt per 06/01/20  [place] and staff message   [name]

## 2020-06-05 NOTE — Telephone Encounter (Signed)
No 06/01/20 LOS   [name]

## 2020-06-07 DIAGNOSIS — R140 Abdominal distension (gaseous): Secondary | ICD-10-CM | POA: Diagnosis not present

## 2020-06-07 DIAGNOSIS — R109 Unspecified abdominal pain: Secondary | ICD-10-CM | POA: Diagnosis not present

## 2020-06-13 ENCOUNTER — Other Ambulatory Visit: Payer: Self-pay | Admitting: *Deleted

## 2020-06-13 DIAGNOSIS — H401131 Primary open-angle glaucoma, bilateral, mild stage: Secondary | ICD-10-CM | POA: Diagnosis not present

## 2020-06-13 DIAGNOSIS — D701 Agranulocytosis secondary to cancer chemotherapy: Secondary | ICD-10-CM

## 2020-06-13 DIAGNOSIS — T451X5A Adverse effect of antineoplastic and immunosuppressive drugs, initial encounter: Secondary | ICD-10-CM

## 2020-06-13 DIAGNOSIS — C3491 Malignant neoplasm of unspecified part of right bronchus or lung: Secondary | ICD-10-CM

## 2020-06-13 MED ORDER — AMOXICILLIN 500 MG PO CAPS: ORAL_CAPSULE | ORAL | 0 refills | Status: DC

## 2020-06-15 DIAGNOSIS — M5136 Other intervertebral disc degeneration, lumbar region: Secondary | ICD-10-CM | POA: Diagnosis not present

## 2020-06-15 DIAGNOSIS — G8929 Other chronic pain: Secondary | ICD-10-CM | POA: Diagnosis not present

## 2020-06-15 DIAGNOSIS — M47817 Spondylosis without myelopathy or radiculopathy, lumbosacral region: Secondary | ICD-10-CM | POA: Diagnosis not present

## 2020-06-15 DIAGNOSIS — M546 Pain in thoracic spine: Secondary | ICD-10-CM | POA: Diagnosis not present

## 2020-06-15 DIAGNOSIS — M47816 Spondylosis without myelopathy or radiculopathy, lumbar region: Secondary | ICD-10-CM | POA: Diagnosis not present

## 2020-06-15 DIAGNOSIS — S2241XA Multiple fractures of ribs, right side, initial encounter for closed fracture: Secondary | ICD-10-CM | POA: Diagnosis not present

## 2020-06-15 DIAGNOSIS — M545 Low back pain, unspecified: Secondary | ICD-10-CM | POA: Diagnosis not present

## 2020-06-22 ENCOUNTER — Encounter: Payer: Self-pay | Admitting: Hematology & Oncology

## 2020-06-22 ENCOUNTER — Telehealth: Payer: Self-pay

## 2020-06-22 ENCOUNTER — Inpatient Hospital Stay (HOSPITAL_BASED_OUTPATIENT_CLINIC_OR_DEPARTMENT_OTHER): Payer: Medicare Other | Admitting: Hematology & Oncology

## 2020-06-22 ENCOUNTER — Other Ambulatory Visit: Payer: Self-pay

## 2020-06-22 ENCOUNTER — Other Ambulatory Visit: Payer: Self-pay | Admitting: *Deleted

## 2020-06-22 ENCOUNTER — Inpatient Hospital Stay: Payer: Medicare Other

## 2020-06-22 ENCOUNTER — Inpatient Hospital Stay: Payer: Medicare Other | Attending: Hematology

## 2020-06-22 VITALS — BP 172/68 | HR 90 | Temp 97.9°F | Resp 17 | Wt 97.0 lb

## 2020-06-22 DIAGNOSIS — Z5112 Encounter for antineoplastic immunotherapy: Secondary | ICD-10-CM | POA: Diagnosis not present

## 2020-06-22 DIAGNOSIS — C3490 Malignant neoplasm of unspecified part of unspecified bronchus or lung: Secondary | ICD-10-CM

## 2020-06-22 DIAGNOSIS — E860 Dehydration: Secondary | ICD-10-CM | POA: Insufficient documentation

## 2020-06-22 DIAGNOSIS — E079 Disorder of thyroid, unspecified: Secondary | ICD-10-CM | POA: Diagnosis not present

## 2020-06-22 DIAGNOSIS — R110 Nausea: Secondary | ICD-10-CM

## 2020-06-22 DIAGNOSIS — C3431 Malignant neoplasm of lower lobe, right bronchus or lung: Secondary | ICD-10-CM | POA: Diagnosis not present

## 2020-06-22 DIAGNOSIS — C3491 Malignant neoplasm of unspecified part of right bronchus or lung: Secondary | ICD-10-CM

## 2020-06-22 LAB — CMP (CANCER CENTER ONLY)
ALT: 15 U/L (ref 0–44)
AST: 8 U/L — ABNORMAL LOW (ref 15–41)
Albumin: 4.5 g/dL (ref 3.5–5.0)
Alkaline Phosphatase: 45 U/L (ref 38–126)
Anion gap: 7 (ref 5–15)
BUN: 12 mg/dL (ref 8–23)
CO2: 24 mmol/L (ref 22–32)
Calcium: 9.4 mg/dL (ref 8.9–10.3)
Chloride: 92 mmol/L — ABNORMAL LOW (ref 98–111)
Creatinine: 0.46 mg/dL (ref 0.44–1.00)
GFR, Estimated: >60 mL/min (ref >60)
Glucose, Bld: 108 mg/dL — ABNORMAL HIGH (ref 70–99)
Potassium: 4.1 mmol/L (ref 3.5–5.1)
Sodium: 123 mmol/L — ABNORMAL LOW (ref 135–145)
Total Bilirubin: 0.5 mg/dL (ref 0.3–1.2)
Total Protein: 6.8 g/dL (ref 6.5–8.1)

## 2020-06-22 LAB — CBC WITH DIFFERENTIAL (CANCER CENTER ONLY)
Abs Immature Granulocytes: 0.01 10*3/uL (ref 0.00–0.07)
Basophils Absolute: 0 10*3/uL (ref 0.0–0.1)
Basophils Relative: 1 %
Eosinophils Absolute: 0.1 10*3/uL (ref 0.0–0.5)
Eosinophils Relative: 2 %
HCT: 37.6 % (ref 36.0–46.0)
Hemoglobin: 13.2 g/dL (ref 12.0–15.0)
Immature Granulocytes: 0 %
Lymphocytes Relative: 15 %
Lymphs Abs: 0.6 10*3/uL — ABNORMAL LOW (ref 0.7–4.0)
MCH: 31.8 pg (ref 26.0–34.0)
MCHC: 35.1 g/dL (ref 30.0–36.0)
MCV: 90.6 fL (ref 80.0–100.0)
Monocytes Absolute: 0.5 10*3/uL (ref 0.1–1.0)
Monocytes Relative: 11 %
Neutro Abs: 3 10*3/uL (ref 1.7–7.7)
Neutrophils Relative %: 71 %
Platelet Count: 230 10*3/uL (ref 150–400)
RBC: 4.15 MIL/uL (ref 3.87–5.11)
RDW: 11.9 % (ref 11.5–15.5)
WBC Count: 4.3 10*3/uL (ref 4.0–10.5)
nRBC: 0 % (ref 0.0–0.2)

## 2020-06-22 LAB — LACTATE DEHYDROGENASE: LDH: 147 U/L (ref 98–192)

## 2020-06-22 MED ORDER — SODIUM CHLORIDE 0.9% FLUSH
10.0000 mL | INTRAVENOUS | Status: DC | PRN
  Administered 2020-06-22: 10 mL
  Filled 2020-06-22: qty 10

## 2020-06-22 MED ORDER — SODIUM CHLORIDE 0.9 % IV SOLN
Freq: Once | INTRAVENOUS | Status: AC
  Filled 2020-06-22: qty 250

## 2020-06-22 MED ORDER — HEPARIN SOD (PORK) LOCK FLUSH 100 UNIT/ML IV SOLN
500.0000 [IU] | Freq: Once | INTRAVENOUS | Status: AC | PRN
Start: 2020-06-22 — End: 2020-06-22
  Administered 2020-06-22: 500 [IU]
  Filled 2020-06-22: qty 5

## 2020-06-22 MED ORDER — SODIUM CHLORIDE 0.9 % IV SOLN
1500.0000 mg | Freq: Once | INTRAVENOUS | Status: AC
  Administered 2020-06-22: 1500 mg via INTRAVENOUS
  Filled 2020-06-22: qty 30

## 2020-06-22 NOTE — Telephone Encounter (Signed)
appts made per 06/22/20 los, los stated 6/2 but tx plan and chart note call for 4 weeks, pt to gain sch in chemo and through [employer]

## 2020-06-22 NOTE — Patient Instructions (Signed)
[facility] Discharge Instructions for Patients Receiving Chemotherapy  Today you received the following chemotherapy agents Imfinzi  To help prevent nausea and vomiting after your treatment, we encourage you to take your nausea medication as prescribed by MD.   If you develop nausea and vomiting that is not controlled by your nausea medication, call the clinic.   BELOW ARE SYMPTOMS THAT SHOULD BE REPORTED IMMEDIATELY:  *FEVER GREATER THAN 100.5 F  *CHILLS WITH OR WITHOUT FEVER  NAUSEA AND VOMITING THAT IS NOT CONTROLLED WITH YOUR NAUSEA MEDICATION  *UNUSUAL SHORTNESS OF BREATH  *UNUSUAL BRUISING OR BLEEDING  TENDERNESS IN MOUTH AND THROAT WITH OR WITHOUT PRESENCE OF ULCERS  *URINARY PROBLEMS  *BOWEL PROBLEMS  UNUSUAL RASH Items with * indicate a potential emergency and should be followed up as soon as possible.  Feel free to call the clinic should you have any questions or concerns. The clinic phone number is [phone].  Please show the [facility] at check-in to the Emergency Department and triage nurse.

## 2020-06-22 NOTE — Progress Notes (Signed)
Hematology and Oncology Follow Up Visit  [name] [id number] 1945/04/22 75 y.o. 06/22/2020   Principle Diagnosis:  Stage IIIA (T4N0M0) squamous cell carcinoma the right lower lung  Past Therapy: Patient completed definitive chemoradiation therapy with weekly carboplatinum /Taxol - completed on 07/16/2019  Current Therapy:        Durvalumab -- maintenance -- s/p cycle #8   Interim History:  [name] is here today for follow-up.  She is still having problems with her back.  She is getting some injections.  I do think they have worked as of yet.  Otherwise, she seems to be doing okay.  She is lost a little bit of weight.  She says that her appetite is not that great because of the back discomfort.  She has had no problems with cough.  There is no nausea or vomiting.  She does have some loose stool although there is no diarrhea.  She has had no leg weakness.  She has had no leg swelling.  There is been no problems with headache.  Overall, her performance status is ECOG 1.    Medications:  Allergies as of 06/22/2020      Reactions   Bee Venom Swelling   Timolol Nausea Only   Brimonidine Tartrate Other (See Comments)   Codeine Other (See Comments)   Other Other (See Comments)   Brimonidine Nausea Only   Hydrocodone Itching, Rash, Other (See Comments)      Medication List       Accurate as of Jun 22, 2020 12:23 PM. If you have any questions, ask your nurse or doctor.        STOP taking these medications   traMADol 50 MG tablet Commonly known as: ULTRAM Stopped by: [name], MD     TAKE these medications   amitriptyline 25 MG tablet Commonly known as: ELAVIL Take 25 mg by mouth at bedtime. Take 1/2 tablet, total of 12.5 mg daily at bedtime.   amoxicillin 500 MG capsule Commonly known as: AMOXIL Take 4 capsules (2000 mg) one hour prior to dental appointment.   diphenhydrAMINE 25 MG tablet Commonly known as: BENADRYL Take 50 mg by mouth daily as  needed (allergic reaction).   dorzolamide 2 % ophthalmic solution Commonly known as: TRUSOPT Place 1 drop into both eyes 2 (two) times daily.   famotidine 10 MG tablet Commonly known as: PEPCID Take 10 mg by mouth daily as needed (take with benadryl when stung).   gabapentin 300 MG capsule Commonly known as: NEURONTIN Take 1 capsule by mouth at bedtime.   levothyroxine 100 MCG tablet Commonly known as: SYNTHROID Take 100 mcg by mouth every morning.   lidocaine 5 % Commonly known as: LIDODERM Place 1 patch onto the skin daily. Remove & Discard patch within 12 hours or as directed by MD   lidocaine-prilocaine cream Commonly known as: EMLA Apply to affected area once   LORazepam 0.5 MG tablet Commonly known as: ATIVAN TAKE 1 TABLET(0.5 MG) BY MOUTH EVERY 6 HOURS AS NEEDED FOR NAUSEA OR VOMITING   ondansetron 8 MG tablet Commonly known as: Zofran Take 1 tablet (8 mg total) by mouth 2 (two) times daily as needed (Nausea or vomiting).   prochlorperazine 10 MG tablet Commonly known as: COMPAZINE Take 1 tablet (10 mg total) by mouth every 6 (six) hours as needed (Nausea or vomiting).   sennosides-docusate sodium 8.6-50 MG tablet Commonly known as: SENOKOT-S Take 2 tablets by mouth daily.   travoprost (benzalkonium) 0.004 %  ophthalmic solution Commonly known as: TRAVATAN Place 1 drop into both eyes at bedtime.   Vitamin D 50 MCG (2000 UT) tablet Take 2,000 Units by mouth daily with supper.       Allergies:  Allergies  Allergen Reactions  . Bee Venom Swelling  . Timolol Nausea Only  . Brimonidine Tartrate Other (See Comments)  . Codeine Other (See Comments)  . Other Other (See Comments)  . Brimonidine Nausea Only  . Hydrocodone Itching, Rash and Other (See Comments)    Past Medical History, Surgical history, Social history, and Family History were reviewed and updated.  Review of Systems: Review of Systems  Constitutional: Positive for malaise/fatigue.   HENT: Negative.   Eyes: Positive for redness.  Respiratory: Positive for cough and sputum production.   Cardiovascular: Negative.   Gastrointestinal: Negative.   Genitourinary: Negative.   Musculoskeletal: Negative.   Skin: Negative.   Neurological: Positive for tingling and focal weakness.  Endo/Heme/Allergies: Negative.   Psychiatric/Behavioral: Negative.      Physical Exam:  weight is 97 lb (44 kg). Her oral temperature is 97.9 F (36.6 C). Her blood pressure is 172/68 (abnormal) and her pulse is 90. Her respiration is 17 and oxygen saturation is 100%.   Wt Readings from Last 3 Encounters:  06/22/20 97 lb (44 kg)  06/01/20 101 lb 0.6 oz (45.8 kg)  06/01/20 101 lb (45.8 kg)  Maybe weekly for labs  Physical Exam Vitals reviewed.  HENT:     Head: Normocephalic and atraumatic.  Eyes:     Pupils: Pupils are equal, round, and reactive to light.  Cardiovascular:     Rate and Rhythm: Normal rate and regular rhythm.     Heart sounds: Normal heart sounds.  Pulmonary:     Effort: Pulmonary effort is normal.     Breath sounds: Normal breath sounds.     Comments: Lungs show distant breath sounds bilaterally.  She has good air movement bilaterally.  I do not hear any wheezes.  She has no decreased sounds to percussion. Abdominal:     General: Bowel sounds are normal.     Palpations: Abdomen is soft.  Musculoskeletal:        General: No tenderness or deformity. Normal range of motion.     Cervical back: Normal range of motion.  Lymphadenopathy:     Cervical: No cervical adenopathy.  Skin:    General: Skin is warm and dry.     Findings: No erythema or rash.  Neurological:     Mental Status: She is alert and oriented to person, place, and time.  Psychiatric:        Behavior: Behavior normal.        Thought Content: Thought content normal.        Judgment: Judgment normal.       Lab Results  Component Value Date   WBC 4.3 06/22/2020   HGB 13.2 06/22/2020   HCT 37.6  06/22/2020   MCV 90.6 06/22/2020   PLT 230 06/22/2020   Lab Results  Component Value Date   FERRITIN 311 (H) 05/25/2019   IRON 22 (L) 05/25/2019   TIBC 288 05/25/2019   UIBC 265 05/25/2019   IRONPCTSAT 8 (L) 05/25/2019   Lab Results  Component Value Date   RBC 4.15 06/22/2020   No results found for: KPAFRELGTCHN, LAMBDASER, KAPLAMBRATIO No results found for: IGGSERUM, IGA, IGMSERUM No results found for: TOTALPROTELP, ALBUMINELP, A1GS, A2GS, BETS, BETA2SER, [place], [place], SPEI   Chemistry  Component Value Date/Time   NA 123 (L) 06/22/2020 1110   K 4.1 06/22/2020 1110   CL 92 (L) 06/22/2020 1110   CO2 24 06/22/2020 1110   BUN 12 06/22/2020 1110   CREATININE 0.46 06/22/2020 1110      Component Value Date/Time   CALCIUM 9.4 06/22/2020 1110   ALKPHOS 45 06/22/2020 1110   AST 8 (L) 06/22/2020 1110   ALT 15 06/22/2020 1110   BILITOT 0.5 06/22/2020 1110       Impression and Plan: [name] is a very nice 75 yo caucasian female with locally advanced stage IIIa non-small cell carcinoma the right lower lung.    She received chemotherapy and radiation therapy with a very nice response.  She now is on maintenance Durvalumab.  She does have a little bit of dehydration.  We will have to make sure that she get some extra IV fluid today.  I just feel bad that her back is bothering her.  Hopefully, the spine doctor will be able to help out.  I would like to think that an MRI is going be done soon to get a better picture as to what might be the problem.  We will plan for another follow-up in 3-4 weeks.    [name], MD 5/12/202212:23 PM

## 2020-06-22 NOTE — Patient Instructions (Signed)

## 2020-06-27 DIAGNOSIS — R140 Abdominal distension (gaseous): Secondary | ICD-10-CM | POA: Diagnosis not present

## 2020-06-27 DIAGNOSIS — M549 Dorsalgia, unspecified: Secondary | ICD-10-CM | POA: Diagnosis not present

## 2020-06-28 ENCOUNTER — Other Ambulatory Visit: Payer: Self-pay | Admitting: Family Medicine

## 2020-06-28 DIAGNOSIS — R140 Abdominal distension (gaseous): Secondary | ICD-10-CM

## 2020-06-30 ENCOUNTER — Ambulatory Visit
Admission: RE | Admit: 2020-06-30 | Discharge: 2020-06-30 | Disposition: A | Discharge status: Home / Self Care | Payer: Medicare Other | Source: Ambulatory Visit | Attending: Family Medicine | Admitting: Family Medicine

## 2020-06-30 DIAGNOSIS — R6881 Early satiety: Secondary | ICD-10-CM | POA: Diagnosis not present

## 2020-06-30 DIAGNOSIS — R140 Abdominal distension (gaseous): Secondary | ICD-10-CM

## 2020-06-30 DIAGNOSIS — R1011 Right upper quadrant pain: Secondary | ICD-10-CM | POA: Diagnosis not present

## 2020-06-30 DIAGNOSIS — E871 Hypo-osmolality and hyponatremia: Secondary | ICD-10-CM | POA: Diagnosis not present

## 2020-06-30 DIAGNOSIS — K5900 Constipation, unspecified: Secondary | ICD-10-CM | POA: Diagnosis not present

## 2020-07-03 ENCOUNTER — Ambulatory Visit: Payer: Medicare Other

## 2020-07-18 ENCOUNTER — Other Ambulatory Visit: Payer: Medicare Other

## 2020-07-18 ENCOUNTER — Ambulatory Visit: Payer: Medicare Other

## 2020-07-18 ENCOUNTER — Ambulatory Visit: Payer: Medicare Other | Admitting: Family

## 2020-07-18 DIAGNOSIS — G8929 Other chronic pain: Secondary | ICD-10-CM | POA: Diagnosis not present

## 2020-07-18 DIAGNOSIS — M545 Low back pain, unspecified: Secondary | ICD-10-CM | POA: Diagnosis not present

## 2020-07-18 DIAGNOSIS — M546 Pain in thoracic spine: Secondary | ICD-10-CM | POA: Diagnosis not present

## 2020-07-21 ENCOUNTER — Inpatient Hospital Stay: Payer: Medicare Other | Attending: Hematology

## 2020-07-21 ENCOUNTER — Inpatient Hospital Stay (HOSPITAL_BASED_OUTPATIENT_CLINIC_OR_DEPARTMENT_OTHER): Payer: Medicare Other | Admitting: Family

## 2020-07-21 ENCOUNTER — Other Ambulatory Visit: Payer: Self-pay | Admitting: Family

## 2020-07-21 ENCOUNTER — Inpatient Hospital Stay: Payer: Medicare Other

## 2020-07-21 ENCOUNTER — Telehealth: Payer: Self-pay

## 2020-07-21 ENCOUNTER — Other Ambulatory Visit: Payer: Self-pay

## 2020-07-21 VITALS — BP 161/58 | HR 94 | Temp 97.7°F | Resp 18 | Wt 95.0 lb

## 2020-07-21 DIAGNOSIS — C3491 Malignant neoplasm of unspecified part of right bronchus or lung: Secondary | ICD-10-CM

## 2020-07-21 DIAGNOSIS — C3431 Malignant neoplasm of lower lobe, right bronchus or lung: Secondary | ICD-10-CM | POA: Diagnosis not present

## 2020-07-21 DIAGNOSIS — Z5112 Encounter for antineoplastic immunotherapy: Secondary | ICD-10-CM | POA: Insufficient documentation

## 2020-07-21 DIAGNOSIS — C3490 Malignant neoplasm of unspecified part of unspecified bronchus or lung: Secondary | ICD-10-CM

## 2020-07-21 DIAGNOSIS — E032 Hypothyroidism due to medicaments and other exogenous substances: Secondary | ICD-10-CM | POA: Diagnosis not present

## 2020-07-21 DIAGNOSIS — Z79899 Other long term (current) drug therapy: Secondary | ICD-10-CM | POA: Diagnosis not present

## 2020-07-21 DIAGNOSIS — E079 Disorder of thyroid, unspecified: Secondary | ICD-10-CM

## 2020-07-21 LAB — CBC WITH DIFFERENTIAL (CANCER CENTER ONLY)
Abs Immature Granulocytes: 0.01 10*3/uL (ref 0.00–0.07)
Basophils Absolute: 0 10*3/uL (ref 0.0–0.1)
Basophils Relative: 1 %
Eosinophils Absolute: 0.1 10*3/uL (ref 0.0–0.5)
Eosinophils Relative: 3 %
HCT: 34.8 % — ABNORMAL LOW (ref 36.0–46.0)
Hemoglobin: 12.2 g/dL (ref 12.0–15.0)
Immature Granulocytes: 0 %
Lymphocytes Relative: 13 %
Lymphs Abs: 0.7 10*3/uL (ref 0.7–4.0)
MCH: 31.8 pg (ref 26.0–34.0)
MCHC: 35.1 g/dL (ref 30.0–36.0)
MCV: 90.6 fL (ref 80.0–100.0)
Monocytes Absolute: 0.6 10*3/uL (ref 0.1–1.0)
Monocytes Relative: 11 %
Neutro Abs: 3.9 10*3/uL (ref 1.7–7.7)
Neutrophils Relative %: 72 %
Platelet Count: 209 10*3/uL (ref 150–400)
RBC: 3.84 MIL/uL — ABNORMAL LOW (ref 3.87–5.11)
RDW: 12.5 % (ref 11.5–15.5)
WBC Count: 5.4 10*3/uL (ref 4.0–10.5)
nRBC: 0 % (ref 0.0–0.2)

## 2020-07-21 LAB — CMP (CANCER CENTER ONLY)
ALT: 14 U/L (ref 0–44)
AST: 8 U/L — ABNORMAL LOW (ref 15–41)
Albumin: 4.4 g/dL (ref 3.5–5.0)
Alkaline Phosphatase: 37 U/L — ABNORMAL LOW (ref 38–126)
Anion gap: 7 (ref 5–15)
BUN: 13 mg/dL (ref 8–23)
CO2: 26 mmol/L (ref 22–32)
Calcium: 9.1 mg/dL (ref 8.9–10.3)
Chloride: 96 mmol/L — ABNORMAL LOW (ref 98–111)
Creatinine: 0.47 mg/dL (ref 0.44–1.00)
GFR, Estimated: >60 mL/min (ref >60)
Glucose, Bld: 112 mg/dL — ABNORMAL HIGH (ref 70–99)
Potassium: 4.1 mmol/L (ref 3.5–5.1)
Sodium: 129 mmol/L — ABNORMAL LOW (ref 135–145)
Total Bilirubin: 0.5 mg/dL (ref 0.3–1.2)
Total Protein: 6.4 g/dL — ABNORMAL LOW (ref 6.5–8.1)

## 2020-07-21 MED ORDER — SODIUM CHLORIDE 0.9 % IV SOLN
1500.0000 mg | Freq: Once | INTRAVENOUS | Status: AC
  Administered 2020-07-21: 1500 mg via INTRAVENOUS
  Filled 2020-07-21: qty 30

## 2020-07-21 MED ORDER — SODIUM CHLORIDE 0.9% FLUSH
10.0000 mL | INTRAVENOUS | Status: DC | PRN
  Administered 2020-07-21: 10 mL
  Filled 2020-07-21: qty 10

## 2020-07-21 MED ORDER — HEPARIN SOD (PORK) LOCK FLUSH 100 UNIT/ML IV SOLN
500.0000 [IU] | Freq: Once | INTRAVENOUS | Status: AC | PRN
  Administered 2020-07-21: 500 [IU]
  Filled 2020-07-21: qty 5

## 2020-07-21 MED ORDER — SODIUM CHLORIDE 0.9 % IV SOLN
Freq: Once | INTRAVENOUS | Status: AC
Start: 2020-07-21 — End: 2020-07-21
  Filled 2020-07-21: qty 250

## 2020-07-21 NOTE — Patient Instructions (Signed)
Durvalumab injection What is this medication? DURVALUMAB (dur VAL ue mab) is a monoclonal antibody. It is used to treat lungcancer. This medicine may be used for other purposes; ask your health care provider orpharmacist if you have questions. COMMON BRAND NAME(S): IMFINZI What should I tell my care team before I take this medication? They need to know if you have any of these conditions: autoimmune diseases like Crohn's disease, ulcerative colitis, or lupus have had or planning to have an allogeneic stem cell transplant (uses someone else's stem cells) history of organ transplant history of radiation to the chest nervous system problems like myasthenia gravis or Guillain-Barre syndrome an unusual or allergic reaction to durvalumab, other medicines, foods, dyes, or preservatives pregnant or trying to get pregnant breast-feeding How should I use this medication? This medicine is for infusion into a vein. It is given by a health careprofessional in a hospital or clinic setting. A special MedGuide will be given to you before each treatment. Be sure to readthis information carefully each time. Talk to your pediatrician regarding the use of this medicine in children.Special care may be needed. Overdosage: If you think you have taken too much of this medicine contact apoison control center or emergency room at once. NOTE: This medicine is only for you. Do not share this medicine with others. What if I miss a dose? It is important not to miss your dose. Call your doctor or health careprofessional if you are unable to keep an appointment. What may interact with this medication? Interactions have not been studied. This list may not describe all possible interactions. Give your health care provider a list of all the medicines, herbs, non-prescription drugs, or dietary supplements you use. Also tell them if you smoke, drink alcohol, or use illegaldrugs. Some items may interact with your medicine. What  should I watch for while using this medication? This drug may make you feel generally unwell. Continue your course of treatmenteven though you feel ill unless your doctor tells you to stop. You may need blood work done while you are taking this medicine. Do not become pregnant while taking this medicine or for 3 months after stopping it. Women should inform their doctor if they wish to become pregnant or think they might be pregnant. There is a potential for serious side effects to an unborn child. Talk to your health care professional or pharmacist for more information. Do not breast-feed an infant while taking this medicine orfor 3 months after stopping it. What side effects may I notice from receiving this medication? Side effects that you should report to your doctor or health care professionalas soon as possible: allergic reactions like skin rash, itching or hives, swelling of the face, lips, or tongue black, tarry stools bloody or watery diarrhea breathing problems change in emotions or moods change in sex drive changes in vision chest pain or chest tightness chills confusion cough facial flushing fever headache signs and symptoms of high blood sugar such as dizziness; dry mouth; dry skin; fruity breath; nausea; stomach pain; increased hunger or thirst; increased urination signs and symptoms of liver injury like dark yellow or brown urine; general ill feeling or flu-like symptoms; light-colored stools; loss of appetite; nausea; right upper belly pain; unusually weak or tired; yellowing of the eyes or skin stomach pain trouble passing urine or change in the amount of urine weight gain or weight loss Side effects that usually do not require medical attention (report these toyour doctor or health care professional if they  continue or are bothersome): bone pain constipation loss of appetite muscle pain nausea swelling of the ankles, feet, hands tiredness This list may not describe  all possible side effects. Call your doctor for medical advice about side effects. You may report side effects to FDA at1-800-FDA-1088. Where should I keep my medication? This drug is given in a hospital or clinic and will not be stored at home. NOTE: This sheet is a summary. It may not cover all possible information. If you have questions about this medicine, talk to your doctor, pharmacist, orhealth care provider.  2022 Elsevier/Gold Standard (2019-04-08 13:01:29)

## 2020-07-21 NOTE — Telephone Encounter (Signed)
Appts made per 07/21/20 los and pt to gain sch in tx/avs and through [employer]

## 2020-07-21 NOTE — Progress Notes (Signed)
Hematology and Oncology Follow Up Visit  [name] [id number] 27-Oct-1945 75 y.o. 07/21/2020   Principle Diagnosis:  Stage IIIA (T4N0M0) squamous cell carcinoma the right lower lung   Past Therapy: Patient completed definitive chemoradiation therapy with weekly carboplatinum /Taxol - completed on 07/16/2019   Current Therapy:        Durvalumab -- maintenance -- s/p cycle 9   Interim History:  [name] is here today for follow-up and treatment. She is feeling fatigued.  She admits that she has no appetite and has not been hydrating well.  She states that GI is working with her regarding what sounds like delayed gastric emptying. She is now taking Pepcid TID and Miralax daily. She fills quickly but is trying to eat and drinks a Boost daily for lunch. She follows up with them again on June 20th.  Her weight is 95 lbs.  No blood loss noted. No bruising or petechiae.  No fever, chills, n/v, cough, rash, dizziness, SOB, chest pain, palpitations, abdominal pain or changes in bowel or bladder habits.  No swelling, tenderness, numbness or tingling in her extremities at this time.  No falls or syncope to report.   ECOG Performance Status: 1 - Symptomatic but completely ambulatory  Medications:  Allergies as of 07/21/2020       Reactions   Bee Venom Swelling   Timolol Nausea Only   Brimonidine Tartrate Other (See Comments)   Codeine Other (See Comments)   Other Other (See Comments)   Brimonidine Nausea Only   Hydrocodone Itching, Rash, Other (See Comments)        Medication List        Accurate as of July 21, 2020 12:49 PM. If you have any questions, ask your nurse or doctor.          amitriptyline 25 MG tablet Commonly known as: ELAVIL Take 25 mg by mouth at bedtime. Take 1/2 tablet, total of 12.5 mg daily at bedtime.   amoxicillin 500 MG capsule Commonly known as: AMOXIL Take 4 capsules (2000 mg) one hour prior to dental appointment.   diphenhydrAMINE 25 MG  tablet Commonly known as: BENADRYL Take 50 mg by mouth daily as needed (allergic reaction).   dorzolamide 2 % ophthalmic solution Commonly known as: TRUSOPT Place 1 drop into both eyes 2 (two) times daily.   famotidine 10 MG tablet Commonly known as: PEPCID Take 10 mg by mouth daily as needed (take with benadryl when stung).   gabapentin 300 MG capsule Commonly known as: NEURONTIN Take 1 capsule by mouth at bedtime.   levothyroxine 100 MCG tablet Commonly known as: SYNTHROID Take 100 mcg by mouth every morning.   lidocaine 5 % Commonly known as: LIDODERM Place 1 patch onto the skin daily. Remove & Discard patch within 12 hours or as directed by MD   lidocaine-prilocaine cream Commonly known as: EMLA Apply to affected area once   LORazepam 0.5 MG tablet Commonly known as: ATIVAN TAKE 1 TABLET(0.5 MG) BY MOUTH EVERY 6 HOURS AS NEEDED FOR NAUSEA OR VOMITING   ondansetron 8 MG tablet Commonly known as: Zofran Take 1 tablet (8 mg total) by mouth 2 (two) times daily as needed (Nausea or vomiting).   prochlorperazine 10 MG tablet Commonly known as: COMPAZINE Take 1 tablet (10 mg total) by mouth every 6 (six) hours as needed (Nausea or vomiting).   sennosides-docusate sodium 8.6-50 MG tablet Commonly known as: SENOKOT-S Take 2 tablets by mouth daily.   travoprost (benzalkonium) 0.004 %  ophthalmic solution Commonly known as: TRAVATAN Place 1 drop into both eyes at bedtime.   Vitamin D 50 MCG (2000 UT) tablet Take 2,000 Units by mouth daily with supper.        Allergies:  Allergies  Allergen Reactions   Bee Venom Swelling   Timolol Nausea Only   Brimonidine Tartrate Other (See Comments)   Codeine Other (See Comments)   Other Other (See Comments)   Brimonidine Nausea Only   Hydrocodone Itching, Rash and Other (See Comments)    Past Medical History, Surgical history, Social history, and Family History were reviewed and updated.  Review of Systems: All other  10 point review of systems is negative.   Physical Exam:  vitals were not taken for this visit.   Wt Readings from Last 3 Encounters:  06/22/20 97 lb (44 kg)  06/01/20 101 lb 0.6 oz (45.8 kg)  06/01/20 101 lb (45.8 kg)    Ocular: Sclerae unicteric, pupils equal, round and reactive to light Ear-nose-throat: Oropharynx clear, dentition fair Lymphatic: No cervical or supraclavicular adenopathy Lungs no rales or rhonchi, good excursion bilaterally Heart regular rate and rhythm, no murmur appreciated Abd soft, nontender, positive bowel sounds MSK no focal spinal tenderness, no joint edema Neuro: non-focal, well-oriented, appropriate affect Breasts: Deferred   Lab Results  Component Value Date   WBC 5.4 07/21/2020   HGB 12.2 07/21/2020   HCT 34.8 (L) 07/21/2020   MCV 90.6 07/21/2020   PLT 209 07/21/2020   Lab Results  Component Value Date   FERRITIN 311 (H) 05/25/2019   IRON 22 (L) 05/25/2019   TIBC 288 05/25/2019   UIBC 265 05/25/2019   IRONPCTSAT 8 (L) 05/25/2019   Lab Results  Component Value Date   RBC 3.84 (L) 07/21/2020   No results found for: KPAFRELGTCHN, LAMBDASER, KAPLAMBRATIO No results found for: IGGSERUM, IGA, IGMSERUM No results found for: TOTALPROTELP, ALBUMINELP, A1GS, A2GS, BETS, BETA2SER, G[place], MSPIKE, SPEI   Chemistry      Component Value Date/Time   NA 129 (L) 07/21/2020 1151   K 4.1 07/21/2020 1151   CL 96 (L) 07/21/2020 1151   CO2 26 07/21/2020 1151   BUN 13 07/21/2020 1151   CREATININE 0.47 07/21/2020 1151      Component Value Date/Time   CALCIUM 9.1 07/21/2020 1151   ALKPHOS 37 (L) 07/21/2020 1151   AST 8 (L) 07/21/2020 1151   ALT 14 07/21/2020 1151   BILITOT 0.5 07/21/2020 1151       Impression and Plan: [name] is a very nice 75 yo caucasian female with locally advanced stage IIIa non-small cell carcinoma the right lower lung.    She received chemotherapy and radiation therapy with a very nice response.   She now is on  maintenance Durvalumab.  She is having some GI issues, as mentioned above, that effect her appetite and hydration. Her GI team is working to get this regulated and to help her gain weight.  We will proceed with treatment today as planned per MD and also give her another 500 ml of hydration while she is here.  Follow-up in 4 weeks.  She can contact our office with any questions or concerns.   Laverna Peace, NP 6/10/202212:49 PM

## 2020-07-24 LAB — TSH: TSH: 0.127 u[IU]/mL — ABNORMAL LOW (ref 0.308–3.960)

## 2020-07-31 DIAGNOSIS — M47816 Spondylosis without myelopathy or radiculopathy, lumbar region: Secondary | ICD-10-CM | POA: Diagnosis not present

## 2020-08-03 DIAGNOSIS — M545 Low back pain, unspecified: Secondary | ICD-10-CM | POA: Diagnosis not present

## 2020-08-03 DIAGNOSIS — M47816 Spondylosis without myelopathy or radiculopathy, lumbar region: Secondary | ICD-10-CM | POA: Diagnosis not present

## 2020-08-03 DIAGNOSIS — M5136 Other intervertebral disc degeneration, lumbar region: Secondary | ICD-10-CM | POA: Diagnosis not present

## 2020-08-03 DIAGNOSIS — M5127 Other intervertebral disc displacement, lumbosacral region: Secondary | ICD-10-CM | POA: Diagnosis not present

## 2020-08-03 DIAGNOSIS — M546 Pain in thoracic spine: Secondary | ICD-10-CM | POA: Diagnosis not present

## 2020-08-03 DIAGNOSIS — M5186 Other intervertebral disc disorders, lumbar region: Secondary | ICD-10-CM | POA: Diagnosis not present

## 2020-08-03 DIAGNOSIS — G8929 Other chronic pain: Secondary | ICD-10-CM | POA: Diagnosis not present

## 2020-08-09 DIAGNOSIS — R140 Abdominal distension (gaseous): Secondary | ICD-10-CM | POA: Diagnosis not present

## 2020-08-09 DIAGNOSIS — R198 Other specified symptoms and signs involving the digestive system and abdomen: Secondary | ICD-10-CM | POA: Diagnosis not present

## 2020-08-09 DIAGNOSIS — R634 Abnormal weight loss: Secondary | ICD-10-CM | POA: Diagnosis not present

## 2020-08-09 DIAGNOSIS — K5900 Constipation, unspecified: Secondary | ICD-10-CM | POA: Diagnosis not present

## 2020-08-16 ENCOUNTER — Other Ambulatory Visit: Payer: Self-pay | Admitting: Gastroenterology

## 2020-08-16 DIAGNOSIS — G8929 Other chronic pain: Secondary | ICD-10-CM | POA: Diagnosis not present

## 2020-08-16 DIAGNOSIS — S2241XA Multiple fractures of ribs, right side, initial encounter for closed fracture: Secondary | ICD-10-CM | POA: Diagnosis not present

## 2020-08-16 DIAGNOSIS — R634 Abnormal weight loss: Secondary | ICD-10-CM

## 2020-08-16 DIAGNOSIS — M545 Low back pain, unspecified: Secondary | ICD-10-CM | POA: Diagnosis not present

## 2020-08-16 DIAGNOSIS — M546 Pain in thoracic spine: Secondary | ICD-10-CM | POA: Diagnosis not present

## 2020-08-19 ENCOUNTER — Encounter (HOSPITAL_BASED_OUTPATIENT_CLINIC_OR_DEPARTMENT_OTHER): Payer: Self-pay | Admitting: *Deleted

## 2020-08-19 ENCOUNTER — Emergency Department (HOSPITAL_BASED_OUTPATIENT_CLINIC_OR_DEPARTMENT_OTHER)
Admission: EM | Admit: 2020-08-19 | Discharge: 2020-08-20 | Disposition: A | Discharge status: Home / Self Care | Payer: Medicare Other | Attending: Emergency Medicine | Admitting: Emergency Medicine

## 2020-08-19 ENCOUNTER — Emergency Department (HOSPITAL_BASED_OUTPATIENT_CLINIC_OR_DEPARTMENT_OTHER): Payer: Medicare Other

## 2020-08-19 ENCOUNTER — Other Ambulatory Visit: Payer: Self-pay

## 2020-08-19 DIAGNOSIS — E871 Hypo-osmolality and hyponatremia: Secondary | ICD-10-CM | POA: Insufficient documentation

## 2020-08-19 DIAGNOSIS — Z87891 Personal history of nicotine dependence: Secondary | ICD-10-CM | POA: Diagnosis not present

## 2020-08-19 DIAGNOSIS — Z85118 Personal history of other malignant neoplasm of bronchus and lung: Secondary | ICD-10-CM | POA: Diagnosis not present

## 2020-08-19 DIAGNOSIS — R0989 Other specified symptoms and signs involving the circulatory and respiratory systems: Secondary | ICD-10-CM | POA: Insufficient documentation

## 2020-08-19 DIAGNOSIS — Z79899 Other long term (current) drug therapy: Secondary | ICD-10-CM | POA: Insufficient documentation

## 2020-08-19 DIAGNOSIS — E039 Hypothyroidism, unspecified: Secondary | ICD-10-CM | POA: Diagnosis not present

## 2020-08-19 DIAGNOSIS — R42 Dizziness and giddiness: Secondary | ICD-10-CM

## 2020-08-19 LAB — URINALYSIS, MICROSCOPIC (REFLEX)

## 2020-08-19 LAB — URINALYSIS, ROUTINE W REFLEX MICROSCOPIC
Bilirubin Urine: NEGATIVE
Glucose, UA: NEGATIVE mg/dL
Hgb urine dipstick: NEGATIVE
Ketones, ur: NEGATIVE mg/dL
Nitrite: NEGATIVE
Protein, ur: NEGATIVE mg/dL
Specific Gravity, Urine: 1.01 (ref 1.005–1.030)
pH: 7.5 (ref 5.0–8.0)

## 2020-08-19 LAB — CBC WITH DIFFERENTIAL/PLATELET
Abs Immature Granulocytes: 0.01 10*3/uL (ref 0.00–0.07)
Basophils Absolute: 0 10*3/uL (ref 0.0–0.1)
Basophils Relative: 0 %
Eosinophils Absolute: 0.2 10*3/uL (ref 0.0–0.5)
Eosinophils Relative: 3 %
HCT: 41.2 % (ref 36.0–46.0)
Hemoglobin: 14.4 g/dL (ref 12.0–15.0)
Immature Granulocytes: 0 %
Lymphocytes Relative: 21 %
Lymphs Abs: 1 10*3/uL (ref 0.7–4.0)
MCH: 32.1 pg (ref 26.0–34.0)
MCHC: 35 g/dL (ref 30.0–36.0)
MCV: 91.8 fL (ref 80.0–100.0)
Monocytes Absolute: 0.6 10*3/uL (ref 0.1–1.0)
Monocytes Relative: 13 %
Neutro Abs: 2.9 10*3/uL (ref 1.7–7.7)
Neutrophils Relative %: 63 %
Platelets: 217 10*3/uL (ref 150–400)
RBC: 4.49 MIL/uL (ref 3.87–5.11)
RDW: 12.5 % (ref 11.5–15.5)
WBC: 4.7 10*3/uL (ref 4.0–10.5)
nRBC: 0 % (ref 0.0–0.2)

## 2020-08-19 LAB — COMPREHENSIVE METABOLIC PANEL
ALT: 14 U/L (ref 0–44)
AST: 10 U/L — ABNORMAL LOW (ref 15–41)
Albumin: 4.7 g/dL (ref 3.5–5.0)
Alkaline Phosphatase: 45 U/L (ref 38–126)
Anion gap: 10 (ref 5–15)
BUN: 13 mg/dL (ref 8–23)
CO2: 26 mmol/L (ref 22–32)
Calcium: 9.3 mg/dL (ref 8.9–10.3)
Chloride: 90 mmol/L — ABNORMAL LOW (ref 98–111)
Creatinine, Ser: 0.43 mg/dL — ABNORMAL LOW (ref 0.44–1.00)
GFR, Estimated: >60 mL/min (ref >60)
Glucose, Bld: 114 mg/dL — ABNORMAL HIGH (ref 70–99)
Potassium: 3.7 mmol/L (ref 3.5–5.1)
Sodium: 126 mmol/L — ABNORMAL LOW (ref 135–145)
Total Bilirubin: 0.3 mg/dL (ref 0.3–1.2)
Total Protein: 7.9 g/dL (ref 6.5–8.1)

## 2020-08-19 LAB — LIPASE, BLOOD: Lipase: 36 U/L (ref 11–51)

## 2020-08-19 MED ORDER — MECLIZINE HCL 25 MG PO TABS
25.0000 mg | ORAL_TABLET | Freq: Once | ORAL | Status: AC
  Administered 2020-08-19: 25 mg via ORAL
  Filled 2020-08-19: qty 1

## 2020-08-19 MED ORDER — SODIUM CHLORIDE 0.9 % IV BOLUS
1000.0000 mL | Freq: Once | INTRAVENOUS | Status: AC
  Administered 2020-08-19: 1000 mL via INTRAVENOUS

## 2020-08-19 NOTE — ED Provider Notes (Signed)
[facility] Provider Note   CSN: [id number] Arrival date & time: 08/19/20  2146     History Chief Complaint  Patient presents with   Dizziness    [name] is a 75 y.o. female.  The history is provided by the patient and medical records.  Dizziness Quality:  Lightheadedness, head spinning and room spinning Severity:  Severe Onset quality:  Sudden Duration:  10 hours Timing:  Constant Progression:  Waxing and waning Chronicity:  New Relieved by:  Nothing Worsened by:  Sitting upright Ineffective treatments:  None tried Associated symptoms: nausea and vomiting   Associated symptoms: no chest pain, no diarrhea, no headaches, no hearing loss, no palpitations, no shortness of breath, no syncope, no tinnitus, no vision changes and no weakness   Risk factors: no hx of vertigo       Past Medical History:  Diagnosis Date   Dyspnea    Glaucoma    Headache    Hypothyroidism    Right lower lobe lung mass    Thyroid condition     Patient Active Problem List   Diagnosis Date Noted   Leukopenia due to antineoplastic chemotherapy ([place]) 06/22/2019   Hyponatremia 05/25/2019   Anemia due to antineoplastic chemotherapy 05/25/2019   Squamous cell carcinoma lung ([place]) 05/07/2019   Thyroid condition     Past Surgical History:  Procedure Laterality Date   BIOPSY  05/11/2019   Procedure: BIOPSY;  Surgeon: Laurin Coder, MD;  Location: [facility];  Service: Pulmonary;;   BRONCHIAL WASHINGS  05/11/2019   Procedure: BRONCHIAL WASHINGS;  Surgeon: Laurin Coder, MD;  Location: [facility];  Service: Pulmonary;;   CATARACT EXTRACTION, BILATERAL     1 was a lens implant   COLONOSCOPY     FINE NEEDLE ASPIRATION  05/11/2019   Procedure: FINE NEEDLE ASPIRATION (FNA) LINEAR;  Surgeon: Laurin Coder, MD;  Location: [facility];  Service: Pulmonary;;   IR IMAGING GUIDED PORT INSERTION  05/27/2019   MEDIASTINOSCOPY N/A 05/21/2019    Procedure: MEDIASTINOSCOPY;  Surgeon: Grace Isaac, MD;  Location: [facility];  Service: Thoracic;  Laterality: N/A;   SHOULDER SURGERY     VIDEO BRONCHOSCOPY WITH ENDOBRONCHIAL ULTRASOUND N/A 05/11/2019   Procedure: VIDEO BRONCHOSCOPY WITH ENDOBRONCHIAL ULTRASOUND;  Surgeon: Laurin Coder, MD;  Location: [facility];  Service: Pulmonary;  Laterality: N/A;   VIDEO BRONCHOSCOPY WITH ENDOBRONCHIAL ULTRASOUND N/A 05/21/2019   Procedure: VIDEO BRONCHOSCOPY WITH ENDOBRONCHIAL ULTRASOUND;  Surgeon: Grace Isaac, MD;  Location: [facility];  Service: Thoracic;  Laterality: N/A;   vocal cord polyp removal     WISDOM TOOTH EXTRACTION       OB History   No obstetric history on file.     History reviewed. No pertinent family history.  Social History   Tobacco Use   Smoking status: Former    Pack years: 0.00    Types: Cigarettes    Quit date: 02/13/2010    Years since quitting: 10.5   Smokeless tobacco: Never   Tobacco comments:    Quit in 2011  Vaping Use   Vaping Use: Former   Devices: used 2-3 weeks while she was quitting cigarettes  Substance Use Topics   Alcohol use: Yes    Comment: very rare, 1 glass of wine maybe every 3-4 weeks   Drug use: No    Home Medications Prior to Admission medications   Medication Sig Start Date End Date Taking? Authorizing Provider  amitriptyline (ELAVIL) 25  MG tablet Take 25 mg by mouth at bedtime. Take 1/2 tablet, total of 12.5 mg daily at bedtime. 06/14/20   [provider]  amoxicillin (AMOXIL) 500 MG capsule Take 4 capsules (2000 mg) one hour prior to dental appointment. 06/13/20   [name], MD  Cholecalciferol (VITAMIN D) 50 MCG (2000 UT) tablet Take 2,000 Units by mouth daily with supper.    [provider]  diphenhydrAMINE (BENADRYL) 25 MG tablet Take 50 mg by mouth daily as needed (allergic reaction). Patient not taking: No sig reported    [provider]  dorzolamide (TRUSOPT) 2 % ophthalmic solution Place 1  drop into both eyes 2 (two) times daily.     [provider]  famotidine (PEPCID) 10 MG tablet Take 10 mg by mouth daily as needed (take with benadryl when stung). Patient not taking: No sig reported    [provider]  gabapentin (NEURONTIN) 300 MG capsule Take 1 capsule by mouth at bedtime. 05/15/20   [provider]  levothyroxine (SYNTHROID) 100 MCG tablet Take 100 mcg by mouth every morning. 03/10/20   [provider]  lidocaine (LIDODERM) 5 % Place 1 patch onto the skin daily. Remove & Discard patch within 12 hours or as directed by MD 01/26/20   [name], MD  lidocaine-prilocaine (EMLA) cream Apply to affected area once 09/29/19   Ennever, Rudell Cobb, MD  LORazepam (ATIVAN) 0.5 MG tablet TAKE 1 TABLET(0.5 MG) BY MOUTH EVERY 6 HOURS AS NEEDED FOR NAUSEA OR VOMITING 06/01/20   [name], MD  ondansetron (ZOFRAN) 8 MG tablet Take 1 tablet (8 mg total) by mouth 2 (two) times daily as needed (Nausea or vomiting). Patient not taking: Reported on 06/01/2020 09/29/19   [name], MD  prochlorperazine (COMPAZINE) 10 MG tablet Take 1 tablet (10 mg total) by mouth every 6 (six) hours as needed (Nausea or vomiting). Patient not taking: Reported on 06/01/2020 09/29/19   [name], MD  sennosides-docusate sodium (SENOKOT-S) 8.6-50 MG tablet Take 2 tablets by mouth daily.    [provider]  travoprost, benzalkonium, (TRAVATAN) 0.004 % ophthalmic solution Place 1 drop into both eyes at bedtime.    [provider]    Allergies    Bee venom, Timolol, Brimonidine tartrate, Codeine, Other, Brimonidine, and Hydrocodone  Review of Systems   Review of Systems  Constitutional:  Positive for fatigue. Negative for chills, diaphoresis and fever.  HENT:  Negative for congestion, hearing loss and tinnitus.   Eyes:  Negative for visual disturbance.  Respiratory:  Negative for cough, chest tightness and shortness of breath.   Cardiovascular:   Negative for chest pain, palpitations, leg swelling and syncope.  Gastrointestinal:  Positive for constipation, nausea and vomiting. Negative for abdominal pain and diarrhea.  Genitourinary:  Negative for dysuria, flank pain and frequency.  Musculoskeletal:  Negative for back pain, neck pain and neck stiffness.  Skin:  Negative for rash and wound.  Neurological:  Positive for dizziness and light-headedness. Negative for syncope, speech difficulty, weakness and headaches.  Psychiatric/Behavioral:  Negative for agitation and confusion.   All other systems reviewed and are negative.  Physical Exam Updated Vital Signs BP (!) 169/108   Pulse 91   Resp 16   Ht 4' 11.5" (1.511 m)   Wt 40.8 kg   SpO2 99%   BMI 17.87 kg/m   Physical Exam Vitals and nursing note reviewed.  Constitutional:      General: She is not  in acute distress.    Appearance: She is well-developed. She is not ill-appearing, toxic-appearing or diaphoretic.  HENT:     Head: Normocephalic and atraumatic.     Mouth/Throat:     Mouth: Mucous membranes are dry.     Pharynx: No oropharyngeal exudate or posterior oropharyngeal erythema.  Eyes:     Extraocular Movements: Extraocular movements intact.     Conjunctiva/sclera: Conjunctivae normal.     Pupils: Pupils are equal, round, and reactive to light.  Neck:     Vascular: Carotid bruit (bilateral) present.  Cardiovascular:     Rate and Rhythm: Normal rate and regular rhythm.     Heart sounds: No murmur heard. Pulmonary:     Effort: Pulmonary effort is normal. No respiratory distress.     Breath sounds: Normal breath sounds.  Abdominal:     Palpations: Abdomen is soft.     Tenderness: There is no abdominal tenderness.  Musculoskeletal:        General: No tenderness.     Cervical back: Neck supple. No tenderness.     Right lower leg: No edema.     Left lower leg: No edema.  Skin:    General: Skin is warm and dry.     Capillary Refill: Capillary refill takes less  than 2 seconds.     Findings: No erythema.  Neurological:     Mental Status: She is alert and oriented to person, place, and time.     Cranial Nerves: No dysarthria or facial asymmetry (R eye ptosis at baseline per pt).     Sensory: No sensory deficit.     Motor: No weakness, tremor, abnormal muscle tone or seizure activity.     Coordination: Finger-Nose-Finger Test normal.     Comments: Gait assessment illicitly deferred due to the dizziness when she just sat up.  Psychiatric:        Mood and Affect: Mood normal.    ED Results / Procedures / Treatments   Labs (all labs ordered are listed, but only abnormal results are displayed) Labs Reviewed  COMPREHENSIVE METABOLIC PANEL - Abnormal; Notable for the following components:      Result Value   Sodium 126 (*)    Chloride 90 (*)    Glucose, Bld 114 (*)    Creatinine, Ser 0.43 (*)    AST 10 (*)    All other components within normal limits  URINALYSIS, ROUTINE W REFLEX MICROSCOPIC - Abnormal; Notable for the following components:   APPearance HAZY (*)    Leukocytes,Ua SMALL (*)    All other components within normal limits  URINALYSIS, MICROSCOPIC (REFLEX) - Abnormal; Notable for the following components:   Bacteria, UA MANY (*)    Non Squamous Epithelial PRESENT (*)    All other components within normal limits  URINE CULTURE  CBC WITH DIFFERENTIAL/PLATELET  LIPASE, BLOOD  TSH    EKG EKG Interpretation  Date/Time:  Saturday August 19 2020 21:56:08 EDT Ventricular Rate:  92 PR Interval:  143 QRS Duration: 84 QT Interval:  387 QTC Calculation: 479 R Axis:   -49 Text Interpretation: Sinus rhythm Left axis deviation Anteroseptal infarct, age indeterminate When compared to prior, more wandering baseline. No sTEMI Confirmed by Antony Blackbird [phone]) on 08/19/2020 10:04:39 PM  Radiology CT Head Wo Contrast  Result Date: 08/19/2020 CLINICAL DATA:  New onset dizziness over the past 10 hours EXAM: CT HEAD WITHOUT CONTRAST TECHNIQUE:  Contiguous axial images were obtained from the base of the skull through the  vertex without intravenous contrast. COMPARISON:  02/22/2020 FINDINGS: Brain: No evidence of acute infarction, hemorrhage, hydrocephalus, extra-axial collection or mass lesion/mass effect. Mild chronic white matter ischemic changes and atrophic changes are noted. Vascular: No hyperdense vessel or unexpected calcification. Skull: Normal. Negative for fracture or focal lesion. Sinuses/Orbits: No acute finding. Other: None. IMPRESSION: Chronic atrophic and ischemic changes without acute abnormality. Electronically Signed   By: Inez Catalina M.D.   On: 08/19/2020 22:59    Procedures Procedures   Medications Ordered in ED Medications  sodium chloride 0.9 % bolus 1,000 mL (1,000 mLs Intravenous New Bag/Given 08/19/20 2257)  meclizine (ANTIVERT) tablet 25 mg (25 mg Oral Given 08/19/20 2258)    ED Course  I have reviewed the triage vital signs and the nursing notes.  Pertinent labs & imaging results that were available during my care of the patient were reviewed by me and considered in my medical decision making (see chart for details).    MDM Rules/Calculators/A&P                          BRACHA FRANKOWSKI is a 75 y.o. female with a past medical history significant for lung cancer status postradiation therapy and chemotherapy currently on immunotherapy, hypothyroidism, and previous Electra abnormality with hyponatremia who presents with 10 hours of dizziness, nausea, vomiting, lightheadedness, and fatigue.  Patient reports that she was doing completely well and normally her last few days and had no recent medication changes.  She says that starting around noon today, she started having dizziness that hit her all of a sudden and has been persistent since.  It does wax and wane in severity but aside from sitting up, she has not found anything seems to provoke it or improve it.  She does not have any history of vertigo to her knowledge  he denies any trauma.  She denies any headache or neck pain whatsoever.  Denies any fevers, chills, neck stiffness, cough, congestion, chest pain, shortness of breath, palpitations, urinary changes or diarrhea.  She does report some chronic constipation for which she takes MiraLAX.  She reports that she has had fatigue and lightheadedness in the setting of hyponatremia in the past but she has never had this dizziness.  She does report that 6 months ago she had a work-up for right-sided Bell's palsy that they had discussed getting MRI for transfer but they did not want to do it at that time.  She denies any recent MRI of her brain.  On exam, lungs clear and chest nontender.  Abdomen nontender.  Patient moving all extremities.  Normal finger-nose-finger testing.  Normal sensation and strength in extremities.  Symmetric smile.  Mild right sided ptosis which she reports is unchanged from please load.  Pupils symmetric and reactive normal extract movements, clear speech.  Patient did get lightheaded and dizzy when sitting up during lung auscultation.  Mucous membranes are dry on exam.  Clinically I do suspect vertigo versus electrolyte imbalance versus stroke.  We will give the patient meclizine and some fluids.  We will also get CT of the head to look for large stroke or bleed however as the symptoms are new today, if symptoms do not improve or there are no other findings, may need to discuss with neurology about possible transfer ED to ED for MRI to rule out acute stroke.  11:13 PM Work-up is begun to return.  As patient expected, she is hyponatremic with a sodium of 126  as well as hypochloremic.  Patient thinks this is probably contributing to her symptoms.  The CT of the head did not show acute stroke but showed some chronic changes.  Urinalysis showed no nitrites and given her lack of urinary symptoms, patient does not think she has UTI.  Patient is only had about 100 cc of her fluids thus far.  She is still  feeling dizzy and lightheaded.  Patient was offered admission at this time due to the hyponatremia likely contributing to her fatigue and dizziness and lightheadedness and during the admission, anticipate getting an MRI to rule out acute stroke however patient wants to wait until the fluids are completed to make sure she is not feeling better before pursuing this.  Patient will be reassessed after fluids are completed and determine if she is still having difficulty with dizziness, lightheadedness, and what she described as some ambulatory difficulty.  If this is still the case, anticipate admission for rehydration and correction of her hyponatremia as well as MRI to rule out stroke.  If she is feeling well, anticipate discharge with prescription for meclizine and instructions to follow-up with a PCP to follow-up on her sodium after rehydration.  Care transferred to oncoming team to await reassessment.   Final Clinical Impression(s) / ED Diagnoses Final diagnoses:  Dizziness  Lightheadedness  Hyponatremia     Clinical Impression: 1. Dizziness   2. Lightheadedness   3. Hyponatremia     Disposition: Care transferred to oncoming team to await reassessment.  This note was prepared with assistance of Systems analyst. Occasional wrong-word or sound-a-like substitutions may have occurred due to the inherent limitations of voice recognition software.     Wynona Duhamel, Gwenyth Allegra, MD 08/19/20 [phone]

## 2020-08-19 NOTE — ED Triage Notes (Signed)
Pt reports gradual onset of dizziness, lightheaded, nausea and HTN today.

## 2020-08-19 NOTE — ED Notes (Signed)
EDP at bedside  

## 2020-08-19 NOTE — ED Notes (Signed)
Pt ambulated to bathroom with standby assisst; gait steady; pt tolerated well

## 2020-08-19 NOTE — ED Notes (Signed)
Pt to CT

## 2020-08-20 LAB — TSH: TSH: 0.042 u[IU]/mL — ABNORMAL LOW (ref 0.350–4.500)

## 2020-08-20 MED ORDER — MECLIZINE HCL 25 MG PO TABS: 25.0000 mg | ORAL_TABLET | Freq: Three times a day (TID) | ORAL | 0 refills | Status: DC | PRN

## 2020-08-20 NOTE — ED Notes (Signed)
EDP at bedside  

## 2020-08-20 NOTE — ED Provider Notes (Signed)
  Physical Exam  BP (!) 184/73   Pulse 81   Resp 17   Ht 4' 11.5" (1.511 m)   Wt 40.8 kg   SpO2 100%   BMI 17.87 kg/m   Physical Exam  ED Course/Procedures     Procedures  MDM   Care assumed from Dr. Sherry Ruffing at shift change.  Patient presenting here with complaints of dizziness that started earlier today.  She describes nausea and spinning/lightheadedness that occurs when she changes position or moves.  She is feeling better after receiving fluids and meclizine.  Her work-up thus far has been unremarkable including head CT and laboratory studies.  She does have a sodium of 126, however this seems to be consistent with her baseline.  She did receive a liter of normal saline which should help to correct that.  Patient was able to ambulate to the bathroom and stated that she felt much better.  At this point, disposition was discussed and patient prefers to go home.  She will be sent home with meclizine and return as needed.        Veryl Speak, MD 08/20/20 [phone]

## 2020-08-20 NOTE — Discharge Instructions (Addendum)
Begin taking meclizine as prescribed as needed for dizziness.  Return to the emergency department if your symptoms worsen or do not improve in the next few days.

## 2020-08-20 NOTE — ED Notes (Addendum)
Pt tolerated PO; pt ambulated again to bathroom, gait steady; pt reports dizziness is better, but still there; EDP aware

## 2020-08-21 LAB — URINE CULTURE: Culture: <10000 — AB

## 2020-08-22 ENCOUNTER — Inpatient Hospital Stay (HOSPITAL_BASED_OUTPATIENT_CLINIC_OR_DEPARTMENT_OTHER): Payer: Medicare Other

## 2020-08-22 ENCOUNTER — Inpatient Hospital Stay: Payer: Medicare Other | Attending: Hematology

## 2020-08-22 ENCOUNTER — Inpatient Hospital Stay: Payer: Medicare Other

## 2020-08-22 ENCOUNTER — Inpatient Hospital Stay (HOSPITAL_BASED_OUTPATIENT_CLINIC_OR_DEPARTMENT_OTHER): Payer: Medicare Other | Admitting: Hematology & Oncology

## 2020-08-22 ENCOUNTER — Other Ambulatory Visit: Payer: Self-pay

## 2020-08-22 DIAGNOSIS — Z5112 Encounter for antineoplastic immunotherapy: Secondary | ICD-10-CM | POA: Insufficient documentation

## 2020-08-22 DIAGNOSIS — E032 Hypothyroidism due to medicaments and other exogenous substances: Secondary | ICD-10-CM

## 2020-08-22 DIAGNOSIS — C3491 Malignant neoplasm of unspecified part of right bronchus or lung: Secondary | ICD-10-CM

## 2020-08-22 DIAGNOSIS — C3490 Malignant neoplasm of unspecified part of unspecified bronchus or lung: Secondary | ICD-10-CM | POA: Diagnosis not present

## 2020-08-22 DIAGNOSIS — C3431 Malignant neoplasm of lower lobe, right bronchus or lung: Secondary | ICD-10-CM | POA: Diagnosis not present

## 2020-08-22 DIAGNOSIS — Z79899 Other long term (current) drug therapy: Secondary | ICD-10-CM | POA: Insufficient documentation

## 2020-08-22 DIAGNOSIS — M810 Age-related osteoporosis without current pathological fracture: Secondary | ICD-10-CM | POA: Insufficient documentation

## 2020-08-22 LAB — CBC WITH DIFFERENTIAL (CANCER CENTER ONLY)
Abs Immature Granulocytes: 0.02 10*3/uL (ref 0.00–0.07)
Basophils Absolute: 0 10*3/uL (ref 0.0–0.1)
Basophils Relative: 0 %
Eosinophils Absolute: 0.2 10*3/uL (ref 0.0–0.5)
Eosinophils Relative: 3 %
HCT: 37.7 % (ref 36.0–46.0)
Hemoglobin: 13.1 g/dL (ref 12.0–15.0)
Immature Granulocytes: 0 %
Lymphocytes Relative: 13 %
Lymphs Abs: 0.8 10*3/uL (ref 0.7–4.0)
MCH: 32.2 pg (ref 26.0–34.0)
MCHC: 34.7 g/dL (ref 30.0–36.0)
MCV: 92.6 fL (ref 80.0–100.0)
Monocytes Absolute: 0.6 10*3/uL (ref 0.1–1.0)
Monocytes Relative: 10 %
Neutro Abs: 4.6 10*3/uL (ref 1.7–7.7)
Neutrophils Relative %: 74 %
Platelet Count: 206 10*3/uL (ref 150–400)
RBC: 4.07 MIL/uL (ref 3.87–5.11)
RDW: 12.5 % (ref 11.5–15.5)
WBC Count: 6.2 10*3/uL (ref 4.0–10.5)
nRBC: 0 % (ref 0.0–0.2)

## 2020-08-22 LAB — CMP (CANCER CENTER ONLY)
ALT: 13 U/L (ref 0–44)
AST: 8 U/L — ABNORMAL LOW (ref 15–41)
Albumin: 4.5 g/dL (ref 3.5–5.0)
Alkaline Phosphatase: 43 U/L (ref 38–126)
Anion gap: 8 (ref 5–15)
BUN: 18 mg/dL (ref 8–23)
CO2: 25 mmol/L (ref 22–32)
Calcium: 9.7 mg/dL (ref 8.9–10.3)
Chloride: 96 mmol/L — ABNORMAL LOW (ref 98–111)
Creatinine: 0.4 mg/dL — ABNORMAL LOW (ref 0.44–1.00)
GFR, Estimated: >60 mL/min (ref >60)
Glucose, Bld: 112 mg/dL — ABNORMAL HIGH (ref 70–99)
Potassium: 4.1 mmol/L (ref 3.5–5.1)
Sodium: 129 mmol/L — ABNORMAL LOW (ref 135–145)
Total Bilirubin: 0.4 mg/dL (ref 0.3–1.2)
Total Protein: 6.8 g/dL (ref 6.5–8.1)

## 2020-08-22 LAB — LACTATE DEHYDROGENASE: LDH: 160 U/L (ref 98–192)

## 2020-08-22 MED ORDER — SODIUM CHLORIDE 0.9 % IV SOLN
Freq: Once | INTRAVENOUS | Status: AC
Start: 2020-08-22 — End: 2020-08-22
  Filled 2020-08-22: qty 250

## 2020-08-22 MED ORDER — DENOSUMAB 120 MG/1.7ML ~~LOC~~ SOLN
120.0000 mg | Freq: Once | SUBCUTANEOUS | Status: AC
  Administered 2020-08-22: 120 mg via SUBCUTANEOUS

## 2020-08-22 MED ORDER — MEGESTROL ACETATE 625 MG/5ML PO SUSP: 625.0000 mg | Freq: Every day | ORAL | 2 refills | Status: DC

## 2020-08-22 MED ORDER — SODIUM CHLORIDE 0.9% FLUSH
10.0000 mL | INTRAVENOUS | Status: DC | PRN
  Administered 2020-08-22: 10 mL
  Filled 2020-08-22: qty 10

## 2020-08-22 MED ORDER — SODIUM CHLORIDE 0.9 % IV SOLN
1500.0000 mg | Freq: Once | INTRAVENOUS | Status: AC
  Administered 2020-08-22: 1500 mg via INTRAVENOUS
  Filled 2020-08-22: qty 30

## 2020-08-22 MED ORDER — HEPARIN SOD (PORK) LOCK FLUSH 100 UNIT/ML IV SOLN
500.0000 [IU] | Freq: Once | INTRAVENOUS | Status: AC | PRN
  Administered 2020-08-22: 500 [IU]
  Filled 2020-08-22: qty 5

## 2020-08-22 NOTE — Patient Instructions (Signed)
Implanted Port Home Guide An implanted port is a device that is placed under the skin. It is usually placed in the chest. The device can be used to give IV medicine, to take blood, or for dialysis. You may have an implanted port if: You need IV medicine that would be irritating to the small veins in your hands or arms. You need IV medicines, such as antibiotics, for a long period of time. You need IV nutrition for a long period of time. You need dialysis. When you have a port, your health care provider can choose to use the port instead of veins in your arms for these procedures. You may have fewer limitations when using a port than you would if you used other types of long-term IVs, and you will likely be able to return to normal activities afteryour incision heals. An implanted port has two main parts: Reservoir. The reservoir is the part where a needle is inserted to give medicines or draw blood. The reservoir is round. After it is placed, it appears as a small, raised area under your skin. Catheter. The catheter is a thin, flexible tube that connects the reservoir to a vein. Medicine that is inserted into the reservoir goes into the catheter and then into the vein. How is my port accessed? To access your port: A numbing cream may be placed on the skin over the port site. Your health care provider will put on a mask and sterile gloves. The skin over your port will be cleaned carefully with a germ-killing soap and allowed to dry. Your health care provider will gently pinch the port and insert a needle into it. Your health care provider will check for a blood return to make sure the port is in the vein and is not clogged. If your port needs to remain accessed to get medicine continuously (constant infusion), your health care provider will place a clear bandage (dressing) over the needle site. The dressing and needle will need to be changed every week, or as told by your health care provider. What  is flushing? Flushing helps keep the port from getting clogged. Follow instructions from your health care provider about how and when to flush the port. Ports are usually flushed with saline solution or a medicine called heparin. The need for flushing will depend on how the port is used: If the port is only used from time to time to give medicines or draw blood, the port may need to be flushed: Before and after medicines have been given. Before and after blood has been drawn. As part of routine maintenance. Flushing may be recommended every 4-6 weeks. If a constant infusion is running, the port may not need to be flushed. Throw away any syringes in a disposal container that is meant for sharp items (sharps container). You can buy a sharps container from a pharmacy, or you can make one by using an empty hard plastic bottle with a cover. How long will my port stay implanted? The port can stay in for as long as your health care provider thinks it is needed. When it is time for the port to come out, a surgery will be done to remove it. The surgery will be similar to the procedure that was done to putthe port in. Follow these instructions at home:  Flush your port as told by your health care provider. If you need an infusion over several days, follow instructions from your health care provider about how to take   care of your port site. Make sure you: Wash your hands with soap and water before you change your dressing. If soap and water are not available, use alcohol-based hand sanitizer. Change your dressing as told by your health care provider. Place any used dressings or infusion bags into a plastic bag. Throw that bag in the trash. Keep the dressing that covers the needle clean and dry. Do not get it wet. Do not use scissors or sharp objects near the tube. Keep the tube clamped, unless it is being used. Check your port site every day for signs of infection. Check for: Redness, swelling, or  pain. Fluid or blood. Pus or a bad smell. Protect the skin around the port site. Avoid wearing bra straps that rub or irritate the site. Protect the skin around your port from seat belts. Place a soft pad over your chest if needed. Bathe or shower as told by your health care provider. The site may get wet as long as you are not actively receiving an infusion. Return to your normal activities as told by your health care provider. Ask your health care provider what activities are safe for you. Carry a medical alert card or wear a medical alert bracelet at all times. This will let health care providers know that you have an implanted port in case of an emergency. Get help right away if: You have redness, swelling, or pain at the port site. You have fluid or blood coming from your port site. You have pus or a bad smell coming from the port site. You have a fever. Summary Implanted ports are usually placed in the chest for long-term IV access. Follow instructions from your health care provider about flushing the port and changing bandages (dressings). Take care of the area around your port by avoiding clothing that puts pressure on the area, and by watching for signs of infection. Protect the skin around your port from seat belts. Place a soft pad over your chest if needed. Get help right away if you have a fever or you have redness, swelling, pain, drainage, or a bad smell at the port site. This information is not intended to replace advice given to you by your health care provider. Make sure you discuss any questions you have with your healthcare provider. Document Revised: 06/14/2019 Document Reviewed: 06/14/2019 Elsevier Patient Education  2022 [employer]  

## 2020-08-22 NOTE — Progress Notes (Signed)
Okay to treat with HR 116 today per Dr. Marin Olp.

## 2020-08-22 NOTE — Progress Notes (Signed)
Hematology and Oncology Follow Up Visit  [name] [id number] 1945/04/22 75 y.o. 08/22/2020   Principle Diagnosis:  Stage IIIA (T4N0M0) squamous cell carcinoma the right lower lung   Past Therapy: Patient completed definitive chemoradiation therapy with weekly carboplatinum /Taxol - completed on 07/16/2019   Current Therapy:        Durvalumab -- maintenance -- s/p cycle # 10   Interim History:  [name] is here today for follow-up and treatment.  The problem right now is that she does not have much of an appetite.  Some of this might be secondary to her being constipated.  She has been followed by gastroenterology.  She apparently has been set up for an MRI of the abdomen later on this week.  She is on MiraLAX now.  She is going to the bathroom.  Her weight is down to 90 pounds.  We will try her on Megace ES.  Maybe this will help with her appetite.  She does not complain of any cough.  There is no wheezing.  She has had no bleeding.  There is no urinary issues.  She has had no fever.  Overall, I would have to say that her performance status is by ECOG 2.    Medications:  Allergies as of 08/22/2020       Reactions   Bee Venom Swelling   Timolol Nausea Only   Brimonidine Tartrate Other (See Comments)   Codeine Other (See Comments)   Other Other (See Comments)   Brimonidine Nausea Only   Hydrocodone Itching, Rash, Other (See Comments)        Medication List        Accurate as of August 22, 2020  2:23 PM. If you have any questions, ask your nurse or doctor.          amitriptyline 25 MG tablet Commonly known as: ELAVIL Take 25 mg by mouth at bedtime. Take 1/2 tablet, total of 12.5 mg daily at bedtime.   amoxicillin 500 MG capsule Commonly known as: AMOXIL Take 4 capsules (2000 mg) one hour prior to dental appointment.   diphenhydrAMINE 25 MG tablet Commonly known as: BENADRYL Take 50 mg by mouth daily as needed (allergic reaction).   dorzolamide 2 %  ophthalmic solution Commonly known as: TRUSOPT Place 1 drop into both eyes 2 (two) times daily.   famotidine 10 MG tablet Commonly known as: PEPCID Take 10 mg by mouth daily as needed (take with benadryl when stung).   gabapentin 300 MG capsule Commonly known as: NEURONTIN Take 1 capsule by mouth at bedtime.   levothyroxine 100 MCG tablet Commonly known as: SYNTHROID Take 100 mcg by mouth every morning.   lidocaine 5 % Commonly known as: LIDODERM Place 1 patch onto the skin daily. Remove & Discard patch within 12 hours or as directed by MD   lidocaine-prilocaine cream Commonly known as: EMLA Apply to affected area once   LORazepam 0.5 MG tablet Commonly known as: ATIVAN TAKE 1 TABLET(0.5 MG) BY MOUTH EVERY 6 HOURS AS NEEDED FOR NAUSEA OR VOMITING   meclizine 25 MG tablet Commonly known as: ANTIVERT Take 1 tablet (25 mg total) by mouth 3 (three) times daily as needed for dizziness.   megestrol 625 MG/5ML suspension Commonly known as: MEGACE ES Take 5 mLs (625 mg total) by mouth daily. Started by: [name], MD   ondansetron 8 MG tablet Commonly known as: Zofran Take 1 tablet (8 mg total) by mouth 2 (two) times daily  as needed (Nausea or vomiting).   prochlorperazine 10 MG tablet Commonly known as: COMPAZINE Take 1 tablet (10 mg total) by mouth every 6 (six) hours as needed (Nausea or vomiting).   sennosides-docusate sodium 8.6-50 MG tablet Commonly known as: SENOKOT-S Take 2 tablets by mouth daily.   travoprost (benzalkonium) 0.004 % ophthalmic solution Commonly known as: TRAVATAN Place 1 drop into both eyes at bedtime.   Vitamin D 50 MCG (2000 UT) tablet Take 2,000 Units by mouth daily with supper.        Allergies:  Allergies  Allergen Reactions   Bee Venom Swelling   Timolol Nausea Only   Brimonidine Tartrate Other (See Comments)   Codeine Other (See Comments)   Other Other (See Comments)   Brimonidine Nausea Only   Hydrocodone Itching,  Rash and Other (See Comments)    Past Medical History, Surgical history, Social history, and Family History were reviewed and updated.  Review of Systems: All other 10 point review of systems is negative.   Physical Exam:  vitals were not taken for this visit.   Wt Readings from Last 3 Encounters:  08/19/20 90 lb (40.8 kg)  07/21/20 95 lb (43.1 kg)  06/22/20 97 lb (44 kg)    Ocular: Sclerae unicteric, pupils equal, round and reactive to light Ear-nose-throat: Oropharynx clear, dentition fair Lymphatic: No cervical or supraclavicular adenopathy Lungs no rales or rhonchi, good excursion bilaterally Heart regular rate and rhythm, no murmur appreciated Abd soft, nontender, positive bowel sounds MSK no focal spinal tenderness, no joint edema Neuro: non-focal, well-oriented, appropriate affect Breasts: Deferred   Lab Results  Component Value Date   WBC 6.2 08/22/2020   HGB 13.1 08/22/2020   HCT 37.7 08/22/2020   MCV 92.6 08/22/2020   PLT 206 08/22/2020   Lab Results  Component Value Date   FERRITIN 311 (H) 05/25/2019   IRON 22 (L) 05/25/2019   TIBC 288 05/25/2019   UIBC 265 05/25/2019   IRONPCTSAT 8 (L) 05/25/2019   Lab Results  Component Value Date   RBC 4.07 08/22/2020   No results found for: KPAFRELGTCHN, LAMBDASER, KAPLAMBRATIO No results found for: IGGSERUM, IGA, IGMSERUM No results found for: TOTALPROTELP, ALBUMINELP, A1GS, A2GS, BETS, BETA2SER, GAMS, MSPIKE, SPEI   Chemistry      Component Value Date/Time   NA 129 (L) 08/22/2020 1255   K 4.1 08/22/2020 1255   CL 96 (L) 08/22/2020 1255   CO2 25 08/22/2020 1255   BUN 18 08/22/2020 1255   CREATININE 0.40 (L) 08/22/2020 1255      Component Value Date/Time   CALCIUM 9.7 08/22/2020 1255   ALKPHOS 43 08/22/2020 1255   AST 8 (L) 08/22/2020 1255   ALT 13 08/22/2020 1255   BILITOT 0.4 08/22/2020 1255       Impression and Plan: [name] is a very nice 75 yo caucasian female with locally advanced stage  IIIa non-small cell carcinoma the right lower lung.      She received chemotherapy and radiation therapy with a very nice response.    She now is on maintenance Durvalumab.   She has 1 more cycle of durvalumab after this 1..  I really do not think that the Durvalumab is causing the issues.  Typically we have to watch out for diarrhea with Durvalumab.  We will go ahead and get her back in 4 weeks.  Hopefully, the insurance will cover the Megace ES.   [name], MD 7/12/20222:23 PM

## 2020-08-22 NOTE — Patient Instructions (Addendum)
Durvalumab injection What is this medication? DURVALUMAB (dur VAL ue mab) is a monoclonal antibody. It is used to treat lungcancer. This medicine may be used for other purposes; ask your health care provider orpharmacist if you have questions. COMMON BRAND NAME(S): IMFINZI What should I tell my care team before I take this medication? They need to know if you have any of these conditions: autoimmune diseases like Crohn's disease, ulcerative colitis, or lupus have had or planning to have an allogeneic stem cell transplant (uses someone else's stem cells) history of organ transplant history of radiation to the chest nervous system problems like myasthenia gravis or Guillain-Barre syndrome an unusual or allergic reaction to durvalumab, other medicines, foods, dyes, or preservatives pregnant or trying to get pregnant breast-feeding How should I use this medication? This medicine is for infusion into a vein. It is given by a health careprofessional in a hospital or clinic setting. A special MedGuide will be given to you before each treatment. Be sure to readthis information carefully each time. Talk to your pediatrician regarding the use of this medicine in children.Special care may be needed. Overdosage: If you think you have taken too much of this medicine contact apoison control center or emergency room at once. NOTE: This medicine is only for you. Do not share this medicine with others. What if I miss a dose? It is important not to miss your dose. Call your doctor or health careprofessional if you are unable to keep an appointment. What may interact with this medication? Interactions have not been studied. This list may not describe all possible interactions. Give your health care provider a list of all the medicines, herbs, non-prescription drugs, or dietary supplements you use. Also tell them if you smoke, drink alcohol, or use illegaldrugs. Some items may interact with your medicine. What  should I watch for while using this medication? This drug may make you feel generally unwell. Continue your course of treatmenteven though you feel ill unless your doctor tells you to stop. You may need blood work done while you are taking this medicine. Do not become pregnant while taking this medicine or for 3 months after stopping it. Women should inform their doctor if they wish to become pregnant or think they might be pregnant. There is a potential for serious side effects to an unborn child. Talk to your health care professional or pharmacist for more information. Do not breast-feed an infant while taking this medicine orfor 3 months after stopping it. What side effects may I notice from receiving this medication? Side effects that you should report to your doctor or health care professionalas soon as possible: allergic reactions like skin rash, itching or hives, swelling of the face, lips, or tongue black, tarry stools bloody or watery diarrhea breathing problems change in emotions or moods change in sex drive changes in vision chest pain or chest tightness chills confusion cough facial flushing fever headache signs and symptoms of high blood sugar such as dizziness; dry mouth; dry skin; fruity breath; nausea; stomach pain; increased hunger or thirst; increased urination signs and symptoms of liver injury like dark yellow or brown urine; general ill feeling or flu-like symptoms; light-colored stools; loss of appetite; nausea; right upper belly pain; unusually weak or tired; yellowing of the eyes or skin stomach pain trouble passing urine or change in the amount of urine weight gain or weight loss Side effects that usually do not require medical attention (report these toyour doctor or health care professional if they  continue or are bothersome): bone pain constipation loss of appetite muscle pain nausea swelling of the ankles, feet, hands tiredness This list may not describe  all possible side effects. Call your doctor for medical advice about side effects. You may report side effects to FDA at1-800-FDA-1088. Where should I keep my medication? This drug is given in a hospital or clinic and will not be stored at home. NOTE: This sheet is a summary. It may not cover all possible information. If you have questions about this medicine, talk to your doctor, pharmacist, orhealth care provider.  2022 Elsevier/Gold Standard (2019-04-08 13:01:29) Denosumab injection What is this medication? DENOSUMAB (den oh sue mab) slows bone breakdown. Prolia is used to treat osteoporosis in women after menopause and in men, and in people who are taking corticosteroids for 6 months or more. Delton See is used to treat a high calcium level due to cancer and to prevent bone fractures and other bone problems caused by multiple myeloma or cancer bone metastases. Delton See is also used totreat giant cell tumor of the bone. This medicine may be used for other purposes; ask your health care provider orpharmacist if you have questions. COMMON BRAND NAME(S): Prolia, XGEVA What should I tell my care team before I take this medication? They need to know if you have any of these conditions: dental disease having surgery or tooth extraction infection kidney disease low levels of calcium or Vitamin D in the blood malnutrition on hemodialysis skin conditions or sensitivity thyroid or parathyroid disease an unusual reaction to denosumab, other medicines, foods, dyes, or preservatives pregnant or trying to get pregnant breast-feeding How should I use this medication? This medicine is for injection under the skin. It is given by a health careprofessional in a hospital or clinic setting. A special MedGuide will be given to you before each treatment. Be sure to readthis information carefully each time. For Prolia, talk to your pediatrician regarding the use of this medicine in children. Special care may be  needed. For Delton See, talk to your pediatrician regarding the use of this medicine in children. While this drug may be prescribed for children as young as 13 years for selected conditions,precautions do apply. Overdosage: If you think you have taken too much of this medicine contact apoison control center or emergency room at once. NOTE: This medicine is only for you. Do not share this medicine with others. What if I miss a dose? It is important not to miss your dose. Call your doctor or health careprofessional if you are unable to keep an appointment. What may interact with this medication? Do not take this medicine with any of the following medications: other medicines containing denosumab This medicine may also interact with the following medications: medicines that lower your chance of fighting infection steroid medicines like prednisone or cortisone This list may not describe all possible interactions. Give your health care provider a list of all the medicines, herbs, non-prescription drugs, or dietary supplements you use. Also tell them if you smoke, drink alcohol, or use illegaldrugs. Some items may interact with your medicine. What should I watch for while using this medication? Visit your doctor or health care professional for regular checks on your progress. Your doctor or health care professional may order blood tests andother tests to see how you are doing. Call your doctor or health care professional for advice if you get a fever, chills or sore throat, or other symptoms of a cold or flu. Do not treat yourself. This drug may  decrease your body's ability to fight infection. Try toavoid being around people who are sick. You should make sure you get enough calcium and vitamin D while you are taking this medicine, unless your doctor tells you not to. Discuss the foods you eatand the vitamins you take with your health care professional. See your dentist regularly. Brush and floss your teeth as  directed. Before youhave any dental work done, tell your dentist you are receiving this medicine. Do not become pregnant while taking this medicine or for 5 months after stopping it. Talk with your doctor or health care professional about your birth control options while taking this medicine. Women should inform their doctor if they wish to become pregnant or think they might be pregnant. There is a potential for serious side effects to an unborn child. Talk to your health careprofessional or pharmacist for more information. What side effects may I notice from receiving this medication? Side effects that you should report to your doctor or health care professionalas soon as possible: allergic reactions like skin rash, itching or hives, swelling of the face, lips, or tongue bone pain breathing problems dizziness jaw pain, especially after dental work redness, blistering, peeling of the skin signs and symptoms of infection like fever or chills; cough; sore throat; pain or trouble passing urine signs of low calcium like fast heartbeat, muscle cramps or muscle pain; pain, tingling, numbness in the hands or feet; seizures unusual bleeding or bruising unusually weak or tired Side effects that usually do not require medical attention (report to yourdoctor or health care professional if they continue or are bothersome): constipation diarrhea headache joint pain loss of appetite muscle pain runny nose tiredness upset stomach This list may not describe all possible side effects. Call your doctor for medical advice about side effects. You may report side effects to FDA at1-800-FDA-1088. Where should I keep my medication? This medicine is only given in a clinic, doctor's office, or other health caresetting and will not be stored at home. NOTE: This sheet is a summary. It may not cover all possible information. If you have questions about this medicine, talk to your doctor, pharmacist, orhealth care  provider.  2022 Elsevier/Gold Standard (2017-06-06 16:10:44)

## 2020-08-23 ENCOUNTER — Encounter: Payer: Self-pay | Admitting: *Deleted

## 2020-08-23 ENCOUNTER — Telehealth: Payer: Self-pay

## 2020-08-23 DIAGNOSIS — R630 Anorexia: Secondary | ICD-10-CM | POA: Insufficient documentation

## 2020-08-23 LAB — TSH: TSH: 0.084 u[IU]/mL — ABNORMAL LOW (ref 0.308–3.960)

## 2020-08-24 ENCOUNTER — Other Ambulatory Visit: Payer: Self-pay | Admitting: *Deleted

## 2020-08-24 DIAGNOSIS — D701 Agranulocytosis secondary to cancer chemotherapy: Secondary | ICD-10-CM

## 2020-08-24 DIAGNOSIS — T451X5A Adverse effect of antineoplastic and immunosuppressive drugs, initial encounter: Secondary | ICD-10-CM

## 2020-08-24 DIAGNOSIS — C3491 Malignant neoplasm of unspecified part of right bronchus or lung: Secondary | ICD-10-CM

## 2020-08-24 MED ORDER — AMOXICILLIN 500 MG PO CAPS: ORAL_CAPSULE | ORAL | 0 refills | Status: DC

## 2020-08-25 ENCOUNTER — Ambulatory Visit
Admission: RE | Admit: 2020-08-25 | Discharge: 2020-08-25 | Disposition: A | Discharge status: Home / Self Care | Payer: Medicare Other | Source: Ambulatory Visit | Attending: Gastroenterology | Admitting: Gastroenterology

## 2020-08-25 ENCOUNTER — Other Ambulatory Visit: Payer: Self-pay | Admitting: Hematology & Oncology

## 2020-08-25 ENCOUNTER — Other Ambulatory Visit: Payer: Self-pay | Admitting: *Deleted

## 2020-08-25 DIAGNOSIS — D701 Agranulocytosis secondary to cancer chemotherapy: Secondary | ICD-10-CM

## 2020-08-25 DIAGNOSIS — R634 Abnormal weight loss: Secondary | ICD-10-CM

## 2020-08-25 DIAGNOSIS — C3490 Malignant neoplasm of unspecified part of unspecified bronchus or lung: Secondary | ICD-10-CM | POA: Diagnosis not present

## 2020-08-25 DIAGNOSIS — J439 Emphysema, unspecified: Secondary | ICD-10-CM | POA: Diagnosis not present

## 2020-08-25 DIAGNOSIS — C3491 Malignant neoplasm of unspecified part of right bronchus or lung: Secondary | ICD-10-CM

## 2020-08-25 DIAGNOSIS — I700 Atherosclerosis of aorta: Secondary | ICD-10-CM | POA: Diagnosis not present

## 2020-08-25 DIAGNOSIS — T451X5A Adverse effect of antineoplastic and immunosuppressive drugs, initial encounter: Secondary | ICD-10-CM

## 2020-08-25 MED ORDER — MEGESTROL ACETATE 400 MG/10ML PO SUSP: 400.0000 mg | Freq: Every day | ORAL | 0 refills | Status: DC

## 2020-08-25 MED ORDER — IOPAMIDOL (ISOVUE-300) INJECTION 61%
100.0000 mL | Freq: Once | INTRAVENOUS | Status: AC | PRN
  Administered 2020-08-25: 100 mL via INTRAVENOUS

## 2020-08-28 DIAGNOSIS — M47816 Spondylosis without myelopathy or radiculopathy, lumbar region: Secondary | ICD-10-CM | POA: Diagnosis not present

## 2020-08-31 DIAGNOSIS — Z681 Body mass index (BMI) 19 or less, adult: Secondary | ICD-10-CM | POA: Diagnosis not present

## 2020-08-31 DIAGNOSIS — Z01419 Encounter for gynecological examination (general) (routine) without abnormal findings: Secondary | ICD-10-CM | POA: Diagnosis not present

## 2020-09-04 ENCOUNTER — Ambulatory Visit: Payer: Medicare Other | Attending: Internal Medicine

## 2020-09-04 DIAGNOSIS — Z23 Encounter for immunization: Secondary | ICD-10-CM

## 2020-09-04 NOTE — Progress Notes (Signed)
   Covid-19 Vaccination Clinic  Name:  MARYKATHERINE SHERWOOD    MRN: [id number] DOB: Jan 03, 1946  09/04/2020  Ms. Meares was observed post Covid-19 immunization for 15 minutes without incident. She was provided with Vaccine Information Sheet and instruction to access the V-Safe system.   Ms. Wile was instructed to call 911 with any severe reactions post vaccine: Difficulty breathing  Swelling of face and throat  A fast heartbeat  A bad rash all over body  Dizziness and weakness

## 2020-09-08 ENCOUNTER — Encounter: Payer: Self-pay | Admitting: Hematology & Oncology

## 2020-09-08 ENCOUNTER — Other Ambulatory Visit (HOSPITAL_BASED_OUTPATIENT_CLINIC_OR_DEPARTMENT_OTHER): Payer: Self-pay

## 2020-09-08 ENCOUNTER — Encounter: Payer: Self-pay | Admitting: Hematology

## 2020-09-08 DIAGNOSIS — K219 Gastro-esophageal reflux disease without esophagitis: Secondary | ICD-10-CM | POA: Diagnosis not present

## 2020-09-08 DIAGNOSIS — R634 Abnormal weight loss: Secondary | ICD-10-CM | POA: Diagnosis not present

## 2020-09-08 DIAGNOSIS — R6881 Early satiety: Secondary | ICD-10-CM | POA: Diagnosis not present

## 2020-09-08 DIAGNOSIS — K5900 Constipation, unspecified: Secondary | ICD-10-CM | POA: Diagnosis not present

## 2020-09-08 MED ORDER — COVID-19 MRNA VAC-TRIS(PFIZER) 30 MCG/0.3ML IM SUSP
INTRAMUSCULAR | 0 refills | Status: DC
  Filled 2020-09-08: qty 0.3, 1d supply, fill #0

## 2020-09-13 DIAGNOSIS — M47814 Spondylosis without myelopathy or radiculopathy, thoracic region: Secondary | ICD-10-CM | POA: Diagnosis not present

## 2020-09-13 DIAGNOSIS — G8929 Other chronic pain: Secondary | ICD-10-CM | POA: Diagnosis not present

## 2020-09-13 DIAGNOSIS — M546 Pain in thoracic spine: Secondary | ICD-10-CM | POA: Diagnosis not present

## 2020-09-19 DIAGNOSIS — M546 Pain in thoracic spine: Secondary | ICD-10-CM | POA: Diagnosis not present

## 2020-09-19 DIAGNOSIS — M47814 Spondylosis without myelopathy or radiculopathy, thoracic region: Secondary | ICD-10-CM | POA: Diagnosis not present

## 2020-09-19 DIAGNOSIS — Z85118 Personal history of other malignant neoplasm of bronchus and lung: Secondary | ICD-10-CM | POA: Diagnosis not present

## 2020-09-19 DIAGNOSIS — M5136 Other intervertebral disc degeneration, lumbar region: Secondary | ICD-10-CM | POA: Diagnosis not present

## 2020-09-19 DIAGNOSIS — G8929 Other chronic pain: Secondary | ICD-10-CM | POA: Diagnosis not present

## 2020-09-25 ENCOUNTER — Inpatient Hospital Stay: Payer: Medicare Other

## 2020-09-25 ENCOUNTER — Other Ambulatory Visit: Payer: Self-pay

## 2020-09-25 ENCOUNTER — Inpatient Hospital Stay: Payer: Medicare Other | Attending: Hematology

## 2020-09-25 ENCOUNTER — Telehealth: Payer: Self-pay

## 2020-09-25 ENCOUNTER — Encounter: Payer: Self-pay | Admitting: Hematology & Oncology

## 2020-09-25 ENCOUNTER — Inpatient Hospital Stay (HOSPITAL_BASED_OUTPATIENT_CLINIC_OR_DEPARTMENT_OTHER): Payer: Medicare Other | Admitting: Hematology & Oncology

## 2020-09-25 VITALS — BP 137/55 | HR 86 | Temp 98.5°F | Resp 17 | Wt 88.0 lb

## 2020-09-25 DIAGNOSIS — C3491 Malignant neoplasm of unspecified part of right bronchus or lung: Secondary | ICD-10-CM

## 2020-09-25 DIAGNOSIS — C3490 Malignant neoplasm of unspecified part of unspecified bronchus or lung: Secondary | ICD-10-CM

## 2020-09-25 DIAGNOSIS — Z5112 Encounter for antineoplastic immunotherapy: Secondary | ICD-10-CM | POA: Insufficient documentation

## 2020-09-25 DIAGNOSIS — E079 Disorder of thyroid, unspecified: Secondary | ICD-10-CM | POA: Diagnosis not present

## 2020-09-25 DIAGNOSIS — C3431 Malignant neoplasm of lower lobe, right bronchus or lung: Secondary | ICD-10-CM | POA: Insufficient documentation

## 2020-09-25 LAB — CBC WITH DIFFERENTIAL (CANCER CENTER ONLY)
Abs Immature Granulocytes: 0.02 10*3/uL (ref 0.00–0.07)
Basophils Absolute: 0 10*3/uL (ref 0.0–0.1)
Basophils Relative: 1 %
Eosinophils Absolute: 0.2 10*3/uL (ref 0.0–0.5)
Eosinophils Relative: 3 %
HCT: 37.1 % (ref 36.0–46.0)
Hemoglobin: 12.8 g/dL (ref 12.0–15.0)
Immature Granulocytes: 0 %
Lymphocytes Relative: 19 %
Lymphs Abs: 0.8 10*3/uL (ref 0.7–4.0)
MCH: 32.6 pg (ref 26.0–34.0)
MCHC: 34.5 g/dL (ref 30.0–36.0)
MCV: 94.4 fL (ref 80.0–100.0)
Monocytes Absolute: 0.5 10*3/uL (ref 0.1–1.0)
Monocytes Relative: 12 %
Neutro Abs: 3 10*3/uL (ref 1.7–7.7)
Neutrophils Relative %: 65 %
Platelet Count: 248 10*3/uL (ref 150–400)
RBC: 3.93 MIL/uL (ref 3.87–5.11)
RDW: 12.7 % (ref 11.5–15.5)
WBC Count: 4.5 10*3/uL (ref 4.0–10.5)
nRBC: 0 % (ref 0.0–0.2)

## 2020-09-25 LAB — CMP (CANCER CENTER ONLY)
ALT: 13 U/L (ref 0–44)
AST: 8 U/L — ABNORMAL LOW (ref 15–41)
Albumin: 4.1 g/dL (ref 3.5–5.0)
Alkaline Phosphatase: 37 U/L — ABNORMAL LOW (ref 38–126)
Anion gap: 8 (ref 5–15)
BUN: 14 mg/dL (ref 8–23)
CO2: 25 mmol/L (ref 22–32)
Calcium: 9 mg/dL (ref 8.9–10.3)
Chloride: 98 mmol/L (ref 98–111)
Creatinine: 0.6 mg/dL (ref 0.44–1.00)
GFR, Estimated: >60 mL/min (ref >60)
Glucose, Bld: 121 mg/dL — ABNORMAL HIGH (ref 70–99)
Potassium: 4 mmol/L (ref 3.5–5.1)
Sodium: 131 mmol/L — ABNORMAL LOW (ref 135–145)
Total Bilirubin: 0.5 mg/dL (ref 0.3–1.2)
Total Protein: 6.1 g/dL — ABNORMAL LOW (ref 6.5–8.1)

## 2020-09-25 LAB — LACTATE DEHYDROGENASE: LDH: 138 U/L (ref 98–192)

## 2020-09-25 MED ORDER — SODIUM CHLORIDE 0.9% FLUSH
10.0000 mL | INTRAVENOUS | Status: DC | PRN
  Administered 2020-09-25: 10 mL

## 2020-09-25 MED ORDER — HEPARIN SOD (PORK) LOCK FLUSH 100 UNIT/ML IV SOLN
500.0000 [IU] | Freq: Once | INTRAVENOUS | Status: AC | PRN
  Administered 2020-09-25: 500 [IU]

## 2020-09-25 MED ORDER — SODIUM CHLORIDE 0.9 % IV SOLN
1500.0000 mg | Freq: Once | INTRAVENOUS | Status: AC
  Administered 2020-09-25: 1500 mg via INTRAVENOUS
  Filled 2020-09-25: qty 30

## 2020-09-25 MED ORDER — SODIUM CHLORIDE 0.9 % IV SOLN: Freq: Once | INTRAVENOUS | Status: AC

## 2020-09-25 NOTE — Progress Notes (Signed)
Hematology and Oncology Follow Up Visit  [name] [id number] [birth year] 75 y.o. 09/25/2020   Principle Diagnosis:  Stage IIIA (T4N0M0) squamous cell carcinoma the right lower lung   Past Therapy: Patient completed definitive chemoradiation therapy with weekly carboplatinum /Taxol - completed on 07/16/2019   Current Therapy:        Durvalumab -- maintenance -- s/p cycle # 11   Interim History:  Ms. Weidemann is here today for follow-up and treatment.  This we are last treatment of Durvalumab.  I am so happy for her.  Her weight still has not gone up.  Her weight today is 88 pounds.  She is on Megace ES.  She says this is helped.  She says she is eating.  Hopefully, her weight will start coming up.  She has had no nausea or vomiting.  There is been no diarrhea.  Is no cough or shortness of breath.  She has had no bleeding.  There is no fever.  She has had no leg swelling.  Overall, her performance status is ECOG 1.    Medications:  Allergies as of 09/25/2020       Reactions   Bee Venom Swelling   Timolol Nausea Only   Brimonidine Tartrate Other (See Comments)   Codeine Other (See Comments)   Other Other (See Comments)   Brimonidine Nausea Only   Hydrocodone Itching, Rash, Other (See Comments)        Medication List        Accurate as of September 25, 2020 12:47 PM. If you have any questions, ask your nurse or doctor.          STOP taking these medications    amitriptyline 25 MG tablet Commonly known as: ELAVIL Stopped by: [name], MD   gabapentin 300 MG capsule Commonly known as: NEURONTIN Stopped by: [name], MD   LORazepam 0.5 MG tablet Commonly known as: ATIVAN Stopped by: [name], MD   PEG-3350/Electrolytes 236 g Solr Stopped by: [name], MD   sennosides-docusate sodium 8.6-50 MG tablet Commonly known as: SENOKOT-S Stopped by: [name], MD       TAKE these medications    amoxicillin 500 MG  capsule Commonly known as: AMOXIL TAKE 4 CAPSULES(2000 MG) BY MOUTH 1 HOUR BEFORE DENTAL APPOINTMENT   Benefiber Powd Take by mouth.   diphenhydrAMINE 25 MG tablet Commonly known as: BENADRYL Take 50 mg by mouth daily as needed (allergic reaction).   dorzolamide 2 % ophthalmic solution Commonly known as: TRUSOPT Place 1 drop into both eyes 2 (two) times daily.   durvalumab 120 MG/2.4ML Soln injection Commonly known as: IMFINZI Inject into the vein.   famotidine 10 MG tablet Commonly known as: PEPCID Take 10 mg by mouth daily as needed (take with benadryl when stung).   levothyroxine 100 MCG tablet Commonly known as: SYNTHROID Take 100 mcg by mouth every morning.   lidocaine 5 % Commonly known as: LIDODERM Place 1 patch onto the skin daily. Remove & Discard patch within 12 hours or as directed by MD   lidocaine-prilocaine cream Commonly known as: EMLA Apply to affected area once   meclizine 25 MG tablet Commonly known as: ANTIVERT Take 1 tablet (25 mg total) by mouth 3 (three) times daily as needed for dizziness.   megestrol 400 MG/10ML suspension Commonly known as: MEGACE Take 10 mLs (400 mg total) by mouth daily.   methocarbamol 500 MG tablet Commonly known as: ROBAXIN Take 500  mg by mouth every 6 (six) hours as needed.   ondansetron 8 MG tablet Commonly known as: Zofran Take 1 tablet (8 mg total) by mouth 2 (two) times daily as needed (Nausea or vomiting).   pantoprazole 40 MG tablet Commonly known as: PROTONIX Take 40 mg by mouth every morning.   Pfizer-BioNT COVID-19 Vac-TriS Susp injection Generic drug: COVID-19 mRNA Vac-TriS (Pfizer) Inject into the muscle.   polyethylene glycol powder 17 GM/SCOOP powder Commonly known as: GLYCOLAX/MIRALAX Take by mouth.   prochlorperazine 10 MG tablet Commonly known as: COMPAZINE Take 1 tablet (10 mg total) by mouth every 6 (six) hours as needed (Nausea or vomiting).   travoprost (benzalkonium) 0.004 %  ophthalmic solution Commonly known as: TRAVATAN Place 1 drop into both eyes at bedtime.   Vitamin D 50 MCG (2000 UT) tablet Take 2,000 Units by mouth daily with supper.        Allergies:  Allergies  Allergen Reactions   Bee Venom Swelling   Timolol Nausea Only   Brimonidine Tartrate Other (See Comments)   Codeine Other (See Comments)   Other Other (See Comments)   Brimonidine Nausea Only   Hydrocodone Itching, Rash and Other (See Comments)    Past Medical History, Surgical history, Social history, and Family History were reviewed and updated.  Review of Systems: All other 10 point review of systems is negative.   Physical Exam:  weight is 88 lb (39.9 kg). Her oral temperature is 98.5 F (36.9 C). Her blood pressure is 137/55 (abnormal) and her pulse is 86. Her respiration is 17 and oxygen saturation is 100%.   Wt Readings from Last 3 Encounters:  09/25/20 88 lb (39.9 kg)  08/19/20 90 lb (40.8 kg)  07/21/20 95 lb (43.1 kg)    Ocular: Sclerae unicteric, pupils equal, round and reactive to light Ear-nose-throat: Oropharynx clear, dentition fair Lymphatic: No cervical or supraclavicular adenopathy Lungs no rales or rhonchi, good excursion bilaterally Heart regular rate and rhythm, no murmur appreciated Abd soft, nontender, positive bowel sounds MSK no focal spinal tenderness, no joint edema Neuro: non-focal, well-oriented, appropriate affect Breasts: Deferred   Lab Results  Component Value Date   WBC 4.5 09/25/2020   HGB 12.8 09/25/2020   HCT 37.1 09/25/2020   MCV 94.4 09/25/2020   PLT 248 09/25/2020   Lab Results  Component Value Date   FERRITIN 311 (H) 05/25/2019   IRON 22 (L) 05/25/2019   TIBC 288 05/25/2019   UIBC 265 05/25/2019   IRONPCTSAT 8 (L) 05/25/2019   Lab Results  Component Value Date   RBC 3.93 09/25/2020   No results found for: KPAFRELGTCHN, LAMBDASER, KAPLAMBRATIO No results found for: IGGSERUM, IGA, IGMSERUM No results found for:  Ronnald Ramp, A1GS, A2GS, BETS, BETA2SER, GAMS, MSPIKE, SPEI   Chemistry      Component Value Date/Time   NA 131 (L) 09/25/2020 1125   K 4.0 09/25/2020 1125   CL 98 09/25/2020 1125   CO2 25 09/25/2020 1125   BUN 14 09/25/2020 1125   CREATININE 0.60 09/25/2020 1125      Component Value Date/Time   CALCIUM 9.0 09/25/2020 1125   ALKPHOS 37 (L) 09/25/2020 1125   AST 8 (L) 09/25/2020 1125   ALT 13 09/25/2020 1125   BILITOT 0.5 09/25/2020 1125       Impression and Plan: Ms. Stange is a very nice 75 yo caucasian female with locally advanced stage IIIa non-small cell carcinoma the right lower lung.      She received  chemotherapy and radiation therapy with a very nice response.    She now is on maintenance Durvalumab.  She will complete this today.  Plan for a follow-up PET scan in about 4 weeks or so.  I know that she also can have a risk of recurrence.  Hopefully, she will be cured.  I am just happy that she looks so good.  I feel confident that her weight eventually will start coming up.  I will see her back in 6 weeks.   [name], MD 8/15/202212:47 PM

## 2020-09-25 NOTE — Telephone Encounter (Signed)
Appts made per 09/25/20 los, [pt to gain a new sch in chemo and at [employer]

## 2020-09-25 NOTE — Patient Instructions (Signed)
Implanted Port Home Guide An implanted port is a device that is placed under the skin. It is usually placed in the chest. The device can be used to give IV medicine, to take blood, or for dialysis. You may have an implanted port if: You need IV medicine that would be irritating to the small veins in your hands or arms. You need IV medicines, such as antibiotics, for a long period of time. You need IV nutrition for a long period of time. You need dialysis. When you have a port, your health care provider can choose to use the port instead of veins in your arms for these procedures. You may have fewer limitations when using a port than you would if you used other types of long-term IVs, and you will likely be able to return to normal activities afteryour incision heals. An implanted port has two main parts: Reservoir. The reservoir is the part where a needle is inserted to give medicines or draw blood. The reservoir is round. After it is placed, it appears as a small, raised area under your skin. Catheter. The catheter is a thin, flexible tube that connects the reservoir to a vein. Medicine that is inserted into the reservoir goes into the catheter and then into the vein. How is my port accessed? To access your port: A numbing cream may be placed on the skin over the port site. Your health care provider will put on a mask and sterile gloves. The skin over your port will be cleaned carefully with a germ-killing soap and allowed to dry. Your health care provider will gently pinch the port and insert a needle into it. Your health care provider will check for a blood return to make sure the port is in the vein and is not clogged. If your port needs to remain accessed to get medicine continuously (constant infusion), your health care provider will place a clear bandage (dressing) over the needle site. The dressing and needle will need to be changed every week, or as told by your health care provider. What  is flushing? Flushing helps keep the port from getting clogged. Follow instructions from your health care provider about how and when to flush the port. Ports are usually flushed with saline solution or a medicine called heparin. The need for flushing will depend on how the port is used: If the port is only used from time to time to give medicines or draw blood, the port may need to be flushed: Before and after medicines have been given. Before and after blood has been drawn. As part of routine maintenance. Flushing may be recommended every 4-6 weeks. If a constant infusion is running, the port may not need to be flushed. Throw away any syringes in a disposal container that is meant for sharp items (sharps container). You can buy a sharps container from a pharmacy, or you can make one by using an empty hard plastic bottle with a cover. How long will my port stay implanted? The port can stay in for as long as your health care provider thinks it is needed. When it is time for the port to come out, a surgery will be done to remove it. The surgery will be similar to the procedure that was done to putthe port in. Follow these instructions at home:  Flush your port as told by your health care provider. If you need an infusion over several days, follow instructions from your health care provider about how to take   care of your port site. Make sure you: Wash your hands with soap and water before you change your dressing. If soap and water are not available, use alcohol-based hand sanitizer. Change your dressing as told by your health care provider. Place any used dressings or infusion bags into a plastic bag. Throw that bag in the trash. Keep the dressing that covers the needle clean and dry. Do not get it wet. Do not use scissors or sharp objects near the tube. Keep the tube clamped, unless it is being used. Check your port site every day for signs of infection. Check for: Redness, swelling, or  pain. Fluid or blood. Pus or a bad smell. Protect the skin around the port site. Avoid wearing bra straps that rub or irritate the site. Protect the skin around your port from seat belts. Place a soft pad over your chest if needed. Bathe or shower as told by your health care provider. The site may get wet as long as you are not actively receiving an infusion. Return to your normal activities as told by your health care provider. Ask your health care provider what activities are safe for you. Carry a medical alert card or wear a medical alert bracelet at all times. This will let health care providers know that you have an implanted port in case of an emergency. Get help right away if: You have redness, swelling, or pain at the port site. You have fluid or blood coming from your port site. You have pus or a bad smell coming from the port site. You have a fever. Summary Implanted ports are usually placed in the chest for long-term IV access. Follow instructions from your health care provider about flushing the port and changing bandages (dressings). Take care of the area around your port by avoiding clothing that puts pressure on the area, and by watching for signs of infection. Protect the skin around your port from seat belts. Place a soft pad over your chest if needed. Get help right away if you have a fever or you have redness, swelling, pain, drainage, or a bad smell at the port site. This information is not intended to replace advice given to you by your health care provider. Make sure you discuss any questions you have with your healthcare provider. Document Revised: 06/14/2019 Document Reviewed: 06/14/2019 Elsevier Patient Education  2022 [employer]  

## 2020-09-25 NOTE — Patient Instructions (Signed)
[facility] [place]  Discharge Instructions: Thank you for choosing [facility] to provide your oncology and hematology care.   If you have a lab appointment with the [facility], please go directly to the [facility] and check in at the registration area.  Wear comfortable clothing and clothing appropriate for easy access to any Portacath or PICC line.   We strive to give you quality time with your provider. You may need to reschedule your appointment if you arrive late (15 or more minutes).  Arriving late affects you and other patients whose appointments are after yours.  Also, if you miss three or more appointments without notifying the office, you may be dismissed from the clinic at the provider's discretion.      For prescription refill requests, have your pharmacy contact our office and allow 72 hours for refills to be completed.    Today you received the following chemotherapy and/or immunotherapy agents Imfinzi       To help prevent nausea and vomiting after your treatment, we encourage you to take your nausea medication as directed.  BELOW ARE SYMPTOMS THAT SHOULD BE REPORTED IMMEDIATELY: *FEVER GREATER THAN 100.4 F (38 C) OR HIGHER *CHILLS OR SWEATING *NAUSEA AND VOMITING THAT IS NOT CONTROLLED WITH YOUR NAUSEA MEDICATION *UNUSUAL SHORTNESS OF BREATH *UNUSUAL BRUISING OR BLEEDING *URINARY PROBLEMS (pain or burning when urinating, or frequent urination) *BOWEL PROBLEMS (unusual diarrhea, constipation, pain near the anus) TENDERNESS IN MOUTH AND THROAT WITH OR WITHOUT PRESENCE OF ULCERS (sore throat, sores in mouth, or a toothache) UNUSUAL RASH, SWELLING OR PAIN  UNUSUAL VAGINAL DISCHARGE OR ITCHING   Items with * indicate a potential emergency and should be followed up as soon as possible or go to the Emergency Department if any problems should occur.  Please show the CHEMOTHERAPY ALERT CARD or IMMUNOTHERAPY ALERT CARD at check-in to the  Emergency Department and triage nurse. Should you have questions after your visit or need to cancel or reschedule your appointment, please contact [facility]  [phone] and follow the prompts.  Office hours are 8:00 a.m. to 4:30 p.m. Monday - Friday. Please note that voicemails left after 4:00 p.m. may not be returned until the following business day.  We are closed weekends and major holidays. You have access to a nurse at all times for urgent questions. Please call the main number to the clinic [phone] and follow the prompts.  For any non-urgent questions, you may also contact your provider using MyChart. We now offer e-Visits for anyone 59 and older to request care online for non-urgent symptoms. For details visit [email or web address].   Also download the MyChart app! Go to the app store, search "MyChart", open the app, select [facility], and log in with your MyChart username and password.  Due to Covid, a mask is required upon entering the hospital/clinic. If you do not have a mask, one will be given to you upon arrival. For doctor visits, patients may have 1 support person aged 14 or older with them. For treatment visits, patients cannot have anyone with them due to current Covid guidelines and our immunocompromised population.

## 2020-10-11 DIAGNOSIS — R634 Abnormal weight loss: Secondary | ICD-10-CM | POA: Diagnosis not present

## 2020-10-11 DIAGNOSIS — K5900 Constipation, unspecified: Secondary | ICD-10-CM | POA: Diagnosis not present

## 2020-10-11 DIAGNOSIS — K219 Gastro-esophageal reflux disease without esophagitis: Secondary | ICD-10-CM | POA: Diagnosis not present

## 2020-10-26 DIAGNOSIS — E039 Hypothyroidism, unspecified: Secondary | ICD-10-CM | POA: Diagnosis not present

## 2020-10-26 DIAGNOSIS — I700 Atherosclerosis of aorta: Secondary | ICD-10-CM | POA: Diagnosis not present

## 2020-10-26 DIAGNOSIS — E559 Vitamin D deficiency, unspecified: Secondary | ICD-10-CM | POA: Diagnosis not present

## 2020-11-01 DIAGNOSIS — E039 Hypothyroidism, unspecified: Secondary | ICD-10-CM | POA: Diagnosis not present

## 2020-11-01 DIAGNOSIS — J439 Emphysema, unspecified: Secondary | ICD-10-CM | POA: Diagnosis not present

## 2020-11-01 DIAGNOSIS — Z23 Encounter for immunization: Secondary | ICD-10-CM | POA: Diagnosis not present

## 2020-11-01 DIAGNOSIS — Z0000 Encounter for general adult medical examination without abnormal findings: Secondary | ICD-10-CM | POA: Diagnosis not present

## 2020-11-10 ENCOUNTER — Encounter: Payer: Self-pay | Admitting: Hematology & Oncology

## 2020-11-10 ENCOUNTER — Other Ambulatory Visit: Payer: Self-pay

## 2020-11-10 ENCOUNTER — Inpatient Hospital Stay (HOSPITAL_BASED_OUTPATIENT_CLINIC_OR_DEPARTMENT_OTHER): Payer: Medicare Other | Admitting: Hematology & Oncology

## 2020-11-10 ENCOUNTER — Inpatient Hospital Stay: Payer: Medicare Other

## 2020-11-10 ENCOUNTER — Inpatient Hospital Stay: Payer: Medicare Other | Attending: Hematology

## 2020-11-10 VITALS — BP 153/71 | HR 96 | Temp 97.9°F | Resp 17 | Wt 90.0 lb

## 2020-11-10 DIAGNOSIS — Z923 Personal history of irradiation: Secondary | ICD-10-CM | POA: Diagnosis not present

## 2020-11-10 DIAGNOSIS — E079 Disorder of thyroid, unspecified: Secondary | ICD-10-CM

## 2020-11-10 DIAGNOSIS — C3491 Malignant neoplasm of unspecified part of right bronchus or lung: Secondary | ICD-10-CM | POA: Diagnosis not present

## 2020-11-10 DIAGNOSIS — C3431 Malignant neoplasm of lower lobe, right bronchus or lung: Secondary | ICD-10-CM | POA: Insufficient documentation

## 2020-11-10 DIAGNOSIS — Z79899 Other long term (current) drug therapy: Secondary | ICD-10-CM | POA: Insufficient documentation

## 2020-11-10 DIAGNOSIS — Z9221 Personal history of antineoplastic chemotherapy: Secondary | ICD-10-CM | POA: Diagnosis not present

## 2020-11-10 LAB — CBC WITH DIFFERENTIAL (CANCER CENTER ONLY)
Abs Immature Granulocytes: 0.02 10*3/uL (ref 0.00–0.07)
Basophils Absolute: 0 10*3/uL (ref 0.0–0.1)
Basophils Relative: 1 %
Eosinophils Absolute: 0.3 10*3/uL (ref 0.0–0.5)
Eosinophils Relative: 6 %
HCT: 37.2 % (ref 36.0–46.0)
Hemoglobin: 12.9 g/dL (ref 12.0–15.0)
Immature Granulocytes: 0 %
Lymphocytes Relative: 19 %
Lymphs Abs: 1 10*3/uL (ref 0.7–4.0)
MCH: 31.9 pg (ref 26.0–34.0)
MCHC: 34.7 g/dL (ref 30.0–36.0)
MCV: 91.9 fL (ref 80.0–100.0)
Monocytes Absolute: 0.6 10*3/uL (ref 0.1–1.0)
Monocytes Relative: 12 %
Neutro Abs: 3.3 10*3/uL (ref 1.7–7.7)
Neutrophils Relative %: 62 %
Platelet Count: 262 10*3/uL (ref 150–400)
RBC: 4.05 MIL/uL (ref 3.87–5.11)
RDW: 11.9 % (ref 11.5–15.5)
WBC Count: 5.3 10*3/uL (ref 4.0–10.5)
nRBC: 0 % (ref 0.0–0.2)

## 2020-11-10 LAB — CMP (CANCER CENTER ONLY)
ALT: 13 U/L (ref 0–44)
AST: 8 U/L — ABNORMAL LOW (ref 15–41)
Albumin: 4.4 g/dL (ref 3.5–5.0)
Alkaline Phosphatase: 45 U/L (ref 38–126)
Anion gap: 7 (ref 5–15)
BUN: 15 mg/dL (ref 8–23)
CO2: 27 mmol/L (ref 22–32)
Calcium: 9.4 mg/dL (ref 8.9–10.3)
Chloride: 95 mmol/L — ABNORMAL LOW (ref 98–111)
Creatinine: 0.48 mg/dL (ref 0.44–1.00)
GFR, Estimated: >60 mL/min (ref >60)
Glucose, Bld: 92 mg/dL (ref 70–99)
Potassium: 3.9 mmol/L (ref 3.5–5.1)
Sodium: 129 mmol/L — ABNORMAL LOW (ref 135–145)
Total Bilirubin: 0.5 mg/dL (ref 0.3–1.2)
Total Protein: 7 g/dL (ref 6.5–8.1)

## 2020-11-10 LAB — LACTATE DEHYDROGENASE: LDH: 152 U/L (ref 98–192)

## 2020-11-10 NOTE — Progress Notes (Signed)
Hematology and Oncology Follow Up Visit  [name] [id number] 1945-12-28 75 y.o. 11/10/2020   Principle Diagnosis:  Stage IIIA (T4N0M0) squamous cell carcinoma the right lower lung   Past Therapy: Patient completed definitive chemoradiation therapy with weekly carboplatinum /Taxol - completed on 07/16/2019   Current Therapy:        Durvalumab -- maintenance -- s/p cycle # 12 =--completed on 09/25/2020   Interim History:  [name] is here today for follow-up.  She now is off all of her therapy.  She really did a nice job.  Unfortunately, she has not yet had her PET scan.  This will be done next week.  Her weight is going up.  I am happy about this.  She says she is hungry.  Hopefully, her weight will continue to go up.  She has had no problems with shortness of breath.  She has little bit of a cough.  It tends to be a dry cough.  She has had no change in bowel or bladder habits.  There is been no bleeding.  She has had no rashes.  She has had no leg swelling.  She has had no headache.  She said her family doctor has adjusted her thyroid medication.  Overall, I would say her performance status is ECOG 1.      Medications:  Allergies as of 11/10/2020       Reactions   Bee Venom Swelling   Timolol Nausea Only   Brimonidine Tartrate Other (See Comments)   Codeine Other (See Comments)   Other Other (See Comments)   Brimonidine Nausea Only   Hydrocodone Itching, Rash, Other (See Comments)        Medication List        Accurate as of November 10, 2020 12:14 PM. If you have any questions, ask your nurse or doctor.          STOP taking these medications    meclizine 25 MG tablet Commonly known as: ANTIVERT Stopped by: [name], MD   megestrol 400 MG/10ML suspension Commonly known as: MEGACE Stopped by: [name], MD   methocarbamol 500 MG tablet Commonly known as: ROBAXIN Stopped by: [name], MD   ondansetron 8 MG  tablet Commonly known as: Zofran Stopped by: [name], MD   prochlorperazine 10 MG tablet Commonly known as: COMPAZINE Stopped by: [name], MD       TAKE these medications    amoxicillin 500 MG capsule Commonly known as: AMOXIL TAKE 4 CAPSULES(2000 MG) BY MOUTH 1 HOUR BEFORE DENTAL APPOINTMENT   Benefiber Powd Take by mouth.   diphenhydrAMINE 25 MG tablet Commonly known as: BENADRYL Take 50 mg by mouth daily as needed (allergic reaction). What changed: Another medication with the same name was removed. Continue taking this medication, and follow the directions you see here. Changed by: [name], MD   dorzolamide 2 % ophthalmic solution Commonly known as: TRUSOPT Place 1 drop into both eyes 2 (two) times daily.   durvalumab 120 MG/2.4ML Soln injection Commonly known as: IMFINZI Inject into the vein.   famotidine 40 MG tablet Commonly known as: PEPCID Take 40 mg by mouth at bedtime. What changed: Another medication with the same name was removed. Continue taking this medication, and follow the directions you see here. Changed by: [name], MD   levothyroxine 88 MCG tablet Commonly known as: SYNTHROID Take 88 mcg by mouth every morning. What changed: Another medication  with the same name was removed. Continue taking this medication, and follow the directions you see here. Changed by: [name], MD   lidocaine 5 % Commonly known as: LIDODERM Place 1 patch onto the skin daily. Remove & Discard patch within 12 hours or as directed by MD   lidocaine-prilocaine cream Commonly known as: EMLA Apply to affected area once   pantoprazole 40 MG tablet Commonly known as: PROTONIX Take 40 mg by mouth every morning.   Pfizer-BioNT COVID-19 Vac-TriS Susp injection Generic drug: COVID-19 mRNA Vac-TriS (Pfizer) Inject into the muscle.   polyethylene glycol powder 17 GM/SCOOP powder Commonly known as: GLYCOLAX/MIRALAX Take by mouth.    travoprost (benzalkonium) 0.004 % ophthalmic solution Commonly known as: TRAVATAN Place 1 drop into both eyes at bedtime.   Vitamin D 50 MCG (2000 UT) tablet Take 2,000 Units by mouth daily with supper. What changed: Another medication with the same name was removed. Continue taking this medication, and follow the directions you see here. Changed by: [name], MD        Allergies:  Allergies  Allergen Reactions   Bee Venom Swelling   Timolol Nausea Only   Brimonidine Tartrate Other (See Comments)   Codeine Other (See Comments)   Other Other (See Comments)   Brimonidine Nausea Only   Hydrocodone Itching, Rash and Other (See Comments)    Past Medical History, Surgical history, Social history, and Family History were reviewed and updated.  Review of Systems: Review of Systems  Constitutional: Negative.   Eyes: Negative.   Respiratory:  Positive for cough.   Cardiovascular: Negative.   Gastrointestinal: Negative.   Genitourinary: Negative.   Musculoskeletal: Negative.   Skin: Negative.   Neurological: Negative.   Endo/Heme/Allergies: Negative.   Psychiatric/Behavioral: Negative.      Physical Exam:  weight is 90 lb (40.8 kg). Her oral temperature is 97.9 F (36.6 C). Her blood pressure is 153/71 (abnormal) and her pulse is 96. Her respiration is 17 and oxygen saturation is 96%.   Wt Readings from Last 3 Encounters:  11/10/20 90 lb (40.8 kg)  09/25/20 88 lb (39.9 kg)  08/19/20 90 lb (40.8 kg)    Physical Exam Vitals reviewed.  HENT:     Head: Normocephalic and atraumatic.  Eyes:     Pupils: Pupils are equal, round, and reactive to light.  Cardiovascular:     Rate and Rhythm: Normal rate and regular rhythm.     Heart sounds: Normal heart sounds.  Pulmonary:     Effort: Pulmonary effort is normal.     Breath sounds: Normal breath sounds.  Abdominal:     General: Bowel sounds are normal.     Palpations: Abdomen is soft.  Musculoskeletal:         General: No tenderness or deformity. Normal range of motion.     Cervical back: Normal range of motion.  Lymphadenopathy:     Cervical: No cervical adenopathy.  Skin:    General: Skin is warm and dry.     Findings: No erythema or rash.  Neurological:     Mental Status: She is alert and oriented to person, place, and time.  Psychiatric:        Behavior: Behavior normal.        Thought Content: Thought content normal.        Judgment: Judgment normal.      Lab Results  Component Value Date   WBC 5.3 11/10/2020   HGB 12.9 11/10/2020  HCT 37.2 11/10/2020   MCV 91.9 11/10/2020   PLT 262 11/10/2020   Lab Results  Component Value Date   FERRITIN 311 (H) 05/25/2019   IRON 22 (L) 05/25/2019   TIBC 288 05/25/2019   UIBC 265 05/25/2019   IRONPCTSAT 8 (L) 05/25/2019   Lab Results  Component Value Date   RBC 4.05 11/10/2020   No results found for: KPAFRELGTCHN, LAMBDASER, KAPLAMBRATIO No results found for: IGGSERUM, IGA, IGMSERUM No results found for: TOTALPROTELP, ALBUMINELP, Helaine Chess, GAMS, MSPIKE, SPEI   Chemistry      Component Value Date/Time   NA 129 (L) 11/10/2020 1011   K 3.9 11/10/2020 1011   CL 95 (L) 11/10/2020 1011   CO2 27 11/10/2020 1011   BUN 15 11/10/2020 1011   CREATININE 0.48 11/10/2020 1011      Component Value Date/Time   CALCIUM 9.4 11/10/2020 1011   ALKPHOS 45 11/10/2020 1011   AST 8 (L) 11/10/2020 1011   ALT 13 11/10/2020 1011   BILITOT 0.5 11/10/2020 1011       Impression and Plan: Ms. Coletta is a very nice 75 yo caucasian female with locally advanced stage IIIa non-small cell carcinoma the right lower lung.      She received chemotherapy and radiation therapy with a very nice response.  We then went ahead and treat her with immunotherapy with Durvalumab.  Hopefully, the PET scan will show that she is in remission.  If all looks good on the PET scan, we will plan to get her back in November.  If all looks good in  November, then we will then plan to get her through the holiday season.  Am glad she is gaining weight.  I am glad that her appetite is coming back.    [name], MD 9/30/202212:14 PM

## 2020-11-10 NOTE — Patient Instructions (Signed)

## 2020-11-13 LAB — TSH: TSH: 0.154 u[IU]/mL — ABNORMAL LOW (ref 0.308–3.960)

## 2020-11-15 ENCOUNTER — Ambulatory Visit (HOSPITAL_COMMUNITY)
Admission: RE | Admit: 2020-11-15 | Discharge: 2020-11-15 | Disposition: A | Discharge status: Home / Self Care | Payer: Medicare Other | Source: Ambulatory Visit | Attending: Hematology & Oncology | Admitting: Hematology & Oncology

## 2020-11-15 ENCOUNTER — Other Ambulatory Visit: Payer: Self-pay

## 2020-11-15 DIAGNOSIS — C3490 Malignant neoplasm of unspecified part of unspecified bronchus or lung: Secondary | ICD-10-CM | POA: Diagnosis not present

## 2020-11-15 DIAGNOSIS — J90 Pleural effusion, not elsewhere classified: Secondary | ICD-10-CM | POA: Diagnosis not present

## 2020-11-15 DIAGNOSIS — C3491 Malignant neoplasm of unspecified part of right bronchus or lung: Secondary | ICD-10-CM

## 2020-11-15 DIAGNOSIS — S2241XD Multiple fractures of ribs, right side, subsequent encounter for fracture with routine healing: Secondary | ICD-10-CM | POA: Diagnosis not present

## 2020-11-15 DIAGNOSIS — Z131 Encounter for screening for diabetes mellitus: Secondary | ICD-10-CM | POA: Diagnosis not present

## 2020-11-15 DIAGNOSIS — J432 Centrilobular emphysema: Secondary | ICD-10-CM | POA: Diagnosis not present

## 2020-11-15 LAB — GLUCOSE, CAPILLARY: Glucose-Capillary: 113 mg/dL — ABNORMAL HIGH (ref 70–99)

## 2020-11-15 MED ORDER — FLUDEOXYGLUCOSE F - 18 (FDG) INJECTION
5.0000 | Freq: Once | INTRAVENOUS | Status: AC | PRN
  Administered 2020-11-15: 5 via INTRAVENOUS

## 2020-11-16 ENCOUNTER — Telehealth: Payer: Self-pay | Admitting: *Deleted

## 2020-11-16 NOTE — Telephone Encounter (Signed)
As noted below by Dr. Marin Olp, I informed the patient that the PET scan looks great and there is no active cancer. She verbalized understanding.

## 2020-11-16 NOTE — Telephone Encounter (Signed)
-----   Message from [name], MD sent at 11/16/2020  4:58 PM EDT ----- Call - the PET scan looks good!!  No active cancer!!  [name]

## 2020-11-17 ENCOUNTER — Telehealth: Payer: Self-pay | Admitting: *Deleted

## 2020-11-17 NOTE — Telephone Encounter (Signed)
Message received from patient requesting PET scan to be faxed to Dr. Lona Kettle.  PET scan faxed per pt.'s request and pt notified.

## 2020-12-07 DIAGNOSIS — I10 Essential (primary) hypertension: Secondary | ICD-10-CM | POA: Diagnosis not present

## 2020-12-07 DIAGNOSIS — R059 Cough, unspecified: Secondary | ICD-10-CM | POA: Diagnosis not present

## 2020-12-19 ENCOUNTER — Ambulatory Visit: Payer: Medicare Other | Attending: Internal Medicine

## 2020-12-19 DIAGNOSIS — Z23 Encounter for immunization: Secondary | ICD-10-CM

## 2020-12-19 NOTE — Progress Notes (Deleted)
cov

## 2020-12-19 NOTE — Progress Notes (Signed)
   Covid-19 Vaccination Clinic  Name:  [name]    MRN: [id number] DOB: [birth year]  12/19/2020  [name] was observed post Covid-19 immunization for 15 minutes without incident. She was provided with Vaccine Information Sheet and instruction to access the V-Safe system.   [name] was instructed to call 911 with any severe reactions post vaccine: Difficulty breathing  Swelling of face and throat  A fast heartbeat  A bad rash all over body  Dizziness and weakness   Immunizations Administered     Name Date Dose VIS Date Route   Pfizer Covid-19 Vaccine Bivalent Booster 12/19/2020 10:24 AM 0.3 mL 10/11/2020 Intramuscular   Manufacturer: [place]   Lot: [id number]   [place]: [phone]

## 2020-12-20 ENCOUNTER — Encounter: Payer: Self-pay | Admitting: Hematology & Oncology

## 2020-12-20 ENCOUNTER — Other Ambulatory Visit: Payer: Self-pay

## 2020-12-20 ENCOUNTER — Inpatient Hospital Stay: Payer: Medicare Other | Attending: Hematology

## 2020-12-20 ENCOUNTER — Inpatient Hospital Stay: Payer: Medicare Other

## 2020-12-20 ENCOUNTER — Inpatient Hospital Stay (HOSPITAL_BASED_OUTPATIENT_CLINIC_OR_DEPARTMENT_OTHER): Payer: Medicare Other | Admitting: Hematology & Oncology

## 2020-12-20 VITALS — BP 168/66 | HR 99 | Temp 98.4°F | Resp 17 | Wt 87.8 lb

## 2020-12-20 DIAGNOSIS — C3431 Malignant neoplasm of lower lobe, right bronchus or lung: Secondary | ICD-10-CM | POA: Diagnosis not present

## 2020-12-20 DIAGNOSIS — C3491 Malignant neoplasm of unspecified part of right bronchus or lung: Secondary | ICD-10-CM | POA: Diagnosis not present

## 2020-12-20 DIAGNOSIS — Z9221 Personal history of antineoplastic chemotherapy: Secondary | ICD-10-CM | POA: Insufficient documentation

## 2020-12-20 DIAGNOSIS — Z923 Personal history of irradiation: Secondary | ICD-10-CM | POA: Insufficient documentation

## 2020-12-20 DIAGNOSIS — C3490 Malignant neoplasm of unspecified part of unspecified bronchus or lung: Secondary | ICD-10-CM

## 2020-12-20 DIAGNOSIS — R059 Cough, unspecified: Secondary | ICD-10-CM | POA: Diagnosis not present

## 2020-12-20 LAB — CMP (CANCER CENTER ONLY)
ALT: 62 U/L — ABNORMAL HIGH (ref 0–44)
AST: 15 U/L (ref 15–41)
Albumin: 4.3 g/dL (ref 3.5–5.0)
Alkaline Phosphatase: 67 U/L (ref 38–126)
Anion gap: 8 (ref 5–15)
BUN: 15 mg/dL (ref 8–23)
CO2: 26 mmol/L (ref 22–32)
Calcium: 9.9 mg/dL (ref 8.9–10.3)
Chloride: 99 mmol/L (ref 98–111)
Creatinine: 0.47 mg/dL (ref 0.44–1.00)
GFR, Estimated: >60 mL/min (ref >60)
Glucose, Bld: 112 mg/dL — ABNORMAL HIGH (ref 70–99)
Potassium: 3.9 mmol/L (ref 3.5–5.1)
Sodium: 133 mmol/L — ABNORMAL LOW (ref 135–145)
Total Bilirubin: 0.3 mg/dL (ref 0.3–1.2)
Total Protein: 6.8 g/dL (ref 6.5–8.1)

## 2020-12-20 LAB — CBC WITH DIFFERENTIAL (CANCER CENTER ONLY)
Abs Immature Granulocytes: 0.01 10*3/uL (ref 0.00–0.07)
Basophils Absolute: 0 10*3/uL (ref 0.0–0.1)
Basophils Relative: 1 %
Eosinophils Absolute: 0.4 10*3/uL (ref 0.0–0.5)
Eosinophils Relative: 8 %
HCT: 37 % (ref 36.0–46.0)
Hemoglobin: 12.5 g/dL (ref 12.0–15.0)
Immature Granulocytes: 0 %
Lymphocytes Relative: 16 %
Lymphs Abs: 0.8 10*3/uL (ref 0.7–4.0)
MCH: 31.6 pg (ref 26.0–34.0)
MCHC: 33.8 g/dL (ref 30.0–36.0)
MCV: 93.7 fL (ref 80.0–100.0)
Monocytes Absolute: 0.5 10*3/uL (ref 0.1–1.0)
Monocytes Relative: 10 %
Neutro Abs: 3.2 10*3/uL (ref 1.7–7.7)
Neutrophils Relative %: 65 %
Platelet Count: 222 10*3/uL (ref 150–400)
RBC: 3.95 MIL/uL (ref 3.87–5.11)
RDW: 12.5 % (ref 11.5–15.5)
WBC Count: 4.9 10*3/uL (ref 4.0–10.5)
nRBC: 0 % (ref 0.0–0.2)

## 2020-12-20 LAB — LACTATE DEHYDROGENASE: LDH: 167 U/L (ref 98–192)

## 2020-12-20 MED ORDER — SULFAMETHOXAZOLE-TRIMETHOPRIM 800-160 MG PO TABS: 1.0000 | ORAL_TABLET | Freq: Two times a day (BID) | ORAL | 0 refills | Status: DC

## 2020-12-20 MED ORDER — HEPARIN SOD (PORK) LOCK FLUSH 100 UNIT/ML IV SOLN
500.0000 [IU] | Freq: Once | INTRAVENOUS | Status: AC
  Administered 2020-12-20: 500 [IU] via INTRAVENOUS

## 2020-12-20 MED ORDER — SODIUM CHLORIDE 0.9% FLUSH
10.0000 mL | INTRAVENOUS | Status: DC | PRN
  Administered 2020-12-20: 10 mL via INTRAVENOUS

## 2020-12-20 MED ORDER — METHYLPREDNISOLONE 4 MG PO TBPK: ORAL_TABLET | ORAL | 0 refills | Status: DC

## 2020-12-20 NOTE — Patient Instructions (Signed)

## 2020-12-20 NOTE — Progress Notes (Signed)
Hematology and Oncology Follow Up Visit  [name] [id number] [birth year] 75 y.o. 12/20/2020   Principle Diagnosis:  Stage IIIA (T4N0M0) squamous cell carcinoma the right lower lung   Past Therapy: Patient completed definitive chemoradiation therapy with weekly carboplatinum /Taxol - completed on 07/16/2019   Current Therapy:        Durvalumab -- maintenance -- s/p cycle # 12 =--completed on 09/25/2020   Interim History:  [name] is here today for follow-up.  She feels okay although she is not sleeping all that well.  I told her to try some melatonin to see if this might help a little bit.  Patient also has this cough.  She is coughing a little bit of purulent sputum.  She may have little bit of tracheobronchitis.  We will try her on some antibiotic and some steroid.   She had a PET scan done back in October.  Thankfully, the PET scan did not show any evidence of recurrent disease.  She still has the right lung mass but this has very low level of activity.  She is still having issues with her weight.  I told her her weight will go back up.  She is very petite to begin with.  She has had no fever.  She has had no rashes.  There has been no leg swelling.  She has had no obvious change in bowel or bladder habits.  She has had no bleeding.  There is no headache.  Overall, I would say her performance status is ECOG 1.       Medications:  Allergies as of 12/20/2020       Reactions   Bee Venom Swelling   Timolol Nausea Only   Brimonidine Tartrate Other (See Comments)   Codeine Other (See Comments)   Other Other (See Comments)   Brimonidine Nausea Only   Hydrocodone Itching, Rash, Other (See Comments)        Medication List        Accurate as of December 20, 2020  4:28 PM. If you have any questions, ask your nurse or doctor.          STOP taking these medications    Pfizer-BioNT COVID-19 Vac-TriS Susp injection Generic drug: COVID-19 mRNA Vac-TriS  Therapist, music) Stopped by: [name], MD       TAKE these medications    albuterol 108 (90 Base) MCG/ACT inhaler Commonly known as: VENTOLIN HFA SMARTSIG:1-2 Puff(s) By Mouth Every 4 Hours PRN   amoxicillin 500 MG capsule Commonly known as: AMOXIL TAKE 4 CAPSULES(2000 MG) BY MOUTH 1 HOUR BEFORE DENTAL APPOINTMENT   Benefiber Powd Take by mouth daily.   diphenhydrAMINE 25 MG tablet Commonly known as: BENADRYL Take 50 mg by mouth daily as needed (allergic reaction).   dorzolamide 2 % ophthalmic solution Commonly known as: TRUSOPT Place 1 drop into both eyes 2 (two) times daily.   durvalumab 120 MG/2.4ML Soln injection Commonly known as: IMFINZI Inject into the vein.   famotidine 40 MG tablet Commonly known as: PEPCID Take 40 mg by mouth at bedtime.   irbesartan 150 MG tablet Commonly known as: AVAPRO Take 150 mg by mouth daily.   levothyroxine 88 MCG tablet Commonly known as: SYNTHROID Take 88 mcg by mouth every morning.   lidocaine 5 % Commonly known as: LIDODERM Place 1 patch onto the skin daily. Remove & Discard patch within 12 hours or as directed by MD   lidocaine-prilocaine cream Commonly known as: EMLA Apply to affected  area once   pantoprazole 40 MG tablet Commonly known as: PROTONIX Take 40 mg by mouth every morning.   polyethylene glycol powder 17 GM/SCOOP powder Commonly known as: GLYCOLAX/MIRALAX Take by mouth.   Spiriva Respimat 2.5 MCG/ACT Aers Generic drug: Tiotropium Bromide Monohydrate SMARTSIG:2 Puff(s) Via Inhaler Daily   travoprost (benzalkonium) 0.004 % ophthalmic solution Commonly known as: TRAVATAN Place 1 drop into both eyes at bedtime.   Vitamin D 50 MCG (2000 UT) tablet Take 2,000 Units by mouth daily with supper.        Allergies:  Allergies  Allergen Reactions   Bee Venom Swelling   Timolol Nausea Only   Brimonidine Tartrate Other (See Comments)   Codeine Other (See Comments)   Other Other (See Comments)    Brimonidine Nausea Only   Hydrocodone Itching, Rash and Other (See Comments)    Past Medical History, Surgical history, Social history, and Family History were reviewed and updated.  Review of Systems: Review of Systems  Constitutional: Negative.   Eyes: Negative.   Respiratory:  Positive for cough.   Cardiovascular: Negative.   Gastrointestinal: Negative.   Genitourinary: Negative.   Musculoskeletal: Negative.   Skin: Negative.   Neurological: Negative.   Endo/Heme/Allergies: Negative.   Psychiatric/Behavioral: Negative.      Physical Exam:  weight is 87 lb 12.8 oz (39.8 kg). Her oral temperature is 98.4 F (36.9 C). Her blood pressure is 168/66 (abnormal) and her pulse is 99. Her respiration is 17 and oxygen saturation is 97%.   Wt Readings from Last 3 Encounters:  12/20/20 87 lb 12.8 oz (39.8 kg)  11/10/20 90 lb (40.8 kg)  09/25/20 88 lb (39.9 kg)    Physical Exam Vitals reviewed.  HENT:     Head: Normocephalic and atraumatic.  Eyes:     Pupils: Pupils are equal, round, and reactive to light.  Cardiovascular:     Rate and Rhythm: Normal rate and regular rhythm.     Heart sounds: Normal heart sounds.  Pulmonary:     Effort: Pulmonary effort is normal.     Breath sounds: Normal breath sounds.  Abdominal:     General: Bowel sounds are normal.     Palpations: Abdomen is soft.  Musculoskeletal:        General: No tenderness or deformity. Normal range of motion.     Cervical back: Normal range of motion.  Lymphadenopathy:     Cervical: No cervical adenopathy.  Skin:    General: Skin is warm and dry.     Findings: No erythema or rash.  Neurological:     Mental Status: She is alert and oriented to person, place, and time.  Psychiatric:        Behavior: Behavior normal.        Thought Content: Thought content normal.        Judgment: Judgment normal.      Lab Results  Component Value Date   WBC 4.9 12/20/2020   HGB 12.5 12/20/2020   HCT 37.0 12/20/2020    MCV 93.7 12/20/2020   PLT 222 12/20/2020   Lab Results  Component Value Date   FERRITIN 311 (H) 05/25/2019   IRON 22 (L) 05/25/2019   TIBC 288 05/25/2019   UIBC 265 05/25/2019   IRONPCTSAT 8 (L) 05/25/2019   Lab Results  Component Value Date   RBC 3.95 12/20/2020   No results found for: KPAFRELGTCHN, LAMBDASER, KAPLAMBRATIO No results found for: IGGSERUM, IGA, IGMSERUM No results found for: TOTALPROTELP, ALBUMINELP, A1GS,  Nelida Meuse, SPEI   Chemistry      Component Value Date/Time   NA 133 (L) 12/20/2020 1509   K 3.9 12/20/2020 1509   CL 99 12/20/2020 1509   CO2 26 12/20/2020 1509   BUN 15 12/20/2020 1509   CREATININE 0.47 12/20/2020 1509      Component Value Date/Time   CALCIUM 9.9 12/20/2020 1509   ALKPHOS 67 12/20/2020 1509   AST 15 12/20/2020 1509   ALT 62 (H) 12/20/2020 1509   BILITOT 0.3 12/20/2020 1509       Impression and Plan: Ms. Landeck is a very nice 75 yo caucasian female with locally advanced stage IIIa non-small cell carcinoma the right lower lung.      She received chemotherapy and radiation therapy with a very nice response.  We then went ahead and treat her with immunotherapy with Durvalumab.  I feel confident that the PET scan shows that she is still in remission.  I think she will always have this lung mass.  I do not see that we have do another scan on her probably until next February or March.  I will send in some Bactrim and a Medrol Dosepak to see this may help with this cough that she has.  I told her to try melatonin.  If this does not work, then we can certainly try something prescription.  She would like to avoid this if possible.  We will get her back after the holidays.  I know she will enjoy Thanksgiving and Christmas with her family.   [name], MD 11/9/20224:28 PM

## 2020-12-21 DIAGNOSIS — D225 Melanocytic nevi of trunk: Secondary | ICD-10-CM | POA: Diagnosis not present

## 2020-12-21 DIAGNOSIS — L578 Other skin changes due to chronic exposure to nonionizing radiation: Secondary | ICD-10-CM | POA: Diagnosis not present

## 2020-12-21 DIAGNOSIS — L821 Other seborrheic keratosis: Secondary | ICD-10-CM | POA: Diagnosis not present

## 2020-12-21 DIAGNOSIS — I10 Essential (primary) hypertension: Secondary | ICD-10-CM | POA: Diagnosis not present

## 2020-12-21 DIAGNOSIS — L814 Other melanin hyperpigmentation: Secondary | ICD-10-CM | POA: Diagnosis not present

## 2020-12-25 DIAGNOSIS — Z961 Presence of intraocular lens: Secondary | ICD-10-CM | POA: Diagnosis not present

## 2020-12-25 DIAGNOSIS — H43813 Vitreous degeneration, bilateral: Secondary | ICD-10-CM | POA: Diagnosis not present

## 2020-12-25 DIAGNOSIS — H401131 Primary open-angle glaucoma, bilateral, mild stage: Secondary | ICD-10-CM | POA: Diagnosis not present

## 2021-01-08 ENCOUNTER — Other Ambulatory Visit (HOSPITAL_BASED_OUTPATIENT_CLINIC_OR_DEPARTMENT_OTHER): Payer: Self-pay

## 2021-01-08 MED ORDER — PFIZER COVID-19 VAC BIVALENT 30 MCG/0.3ML IM SUSP
INTRAMUSCULAR | 0 refills | Status: DC
  Filled 2021-01-08: qty 0.3, 1d supply, fill #0

## 2021-01-31 DIAGNOSIS — E039 Hypothyroidism, unspecified: Secondary | ICD-10-CM | POA: Diagnosis not present

## 2021-02-19 ENCOUNTER — Encounter: Payer: Self-pay | Admitting: Hematology & Oncology

## 2021-02-19 ENCOUNTER — Other Ambulatory Visit: Payer: Self-pay

## 2021-02-19 ENCOUNTER — Inpatient Hospital Stay: Payer: Medicare Other

## 2021-02-19 ENCOUNTER — Inpatient Hospital Stay: Payer: Medicare Other | Attending: Hematology

## 2021-02-19 ENCOUNTER — Inpatient Hospital Stay (HOSPITAL_BASED_OUTPATIENT_CLINIC_OR_DEPARTMENT_OTHER): Payer: Medicare Other | Admitting: Hematology & Oncology

## 2021-02-19 ENCOUNTER — Other Ambulatory Visit: Payer: Self-pay | Admitting: Family

## 2021-02-19 VITALS — BP 125/56 | HR 81 | Temp 97.6°F | Resp 17 | Wt 92.0 lb

## 2021-02-19 DIAGNOSIS — C3491 Malignant neoplasm of unspecified part of right bronchus or lung: Secondary | ICD-10-CM

## 2021-02-19 DIAGNOSIS — C3431 Malignant neoplasm of lower lobe, right bronchus or lung: Secondary | ICD-10-CM | POA: Insufficient documentation

## 2021-02-19 LAB — CBC WITH DIFFERENTIAL (CANCER CENTER ONLY)
Abs Immature Granulocytes: 0.01 10*3/uL (ref 0.00–0.07)
Basophils Absolute: 0 10*3/uL (ref 0.0–0.1)
Basophils Relative: 1 %
Eosinophils Absolute: 0.2 10*3/uL (ref 0.0–0.5)
Eosinophils Relative: 3 %
HCT: 35.4 % — ABNORMAL LOW (ref 36.0–46.0)
Hemoglobin: 12.1 g/dL (ref 12.0–15.0)
Immature Granulocytes: 0 %
Lymphocytes Relative: 22 %
Lymphs Abs: 1.1 10*3/uL (ref 0.7–4.0)
MCH: 31.6 pg (ref 26.0–34.0)
MCHC: 34.2 g/dL (ref 30.0–36.0)
MCV: 92.4 fL (ref 80.0–100.0)
Monocytes Absolute: 0.6 10*3/uL (ref 0.1–1.0)
Monocytes Relative: 13 %
Neutro Abs: 3.1 10*3/uL (ref 1.7–7.7)
Neutrophils Relative %: 61 %
Platelet Count: 245 10*3/uL (ref 150–400)
RBC: 3.83 MIL/uL — ABNORMAL LOW (ref 3.87–5.11)
RDW: 13 % (ref 11.5–15.5)
WBC Count: 5 10*3/uL (ref 4.0–10.5)
nRBC: 0 % (ref 0.0–0.2)

## 2021-02-19 LAB — CMP (CANCER CENTER ONLY)
ALT: 12 U/L (ref 0–44)
AST: 7 U/L — ABNORMAL LOW (ref 15–41)
Albumin: 4.3 g/dL (ref 3.5–5.0)
Alkaline Phosphatase: 56 U/L (ref 38–126)
Anion gap: 7 (ref 5–15)
BUN: 16 mg/dL (ref 8–23)
CO2: 26 mmol/L (ref 22–32)
Calcium: 9.7 mg/dL (ref 8.9–10.3)
Chloride: 98 mmol/L (ref 98–111)
Creatinine: 0.49 mg/dL (ref 0.44–1.00)
GFR, Estimated: >60 mL/min (ref >60)
Glucose, Bld: 84 mg/dL (ref 70–99)
Potassium: 3.9 mmol/L (ref 3.5–5.1)
Sodium: 131 mmol/L — ABNORMAL LOW (ref 135–145)
Total Bilirubin: 0.5 mg/dL (ref 0.3–1.2)
Total Protein: 6.7 g/dL (ref 6.5–8.1)

## 2021-02-19 LAB — LACTATE DEHYDROGENASE: LDH: 137 U/L (ref 98–192)

## 2021-02-19 MED ORDER — HEPARIN SOD (PORK) LOCK FLUSH 100 UNIT/ML IV SOLN
500.0000 [IU] | Freq: Once | INTRAVENOUS | Status: AC
  Administered 2021-02-19: 500 [IU] via INTRAVENOUS

## 2021-02-19 MED ORDER — SODIUM CHLORIDE 0.9% FLUSH
10.0000 mL | Freq: Once | INTRAVENOUS | Status: AC
  Administered 2021-02-19: 10 mL via INTRAVENOUS

## 2021-02-19 NOTE — Patient Instructions (Signed)

## 2021-02-19 NOTE — Progress Notes (Signed)
Hematology and Oncology Follow Up Visit  [name] [id number] 10/18/1945 76 y.o. 02/19/2021   Principle Diagnosis:  Stage IIIA (T4N0M0) squamous cell carcinoma the right lower lung   Past Therapy: Patient completed definitive chemoradiation therapy with weekly carboplatinum /Taxol - completed on 07/16/2019   Current Therapy:        Durvalumab -- maintenance -- s/p cycle # 12 =--completed on 09/25/2020   Interim History:  [name] is here today for follow-up.  She had a wonderful holiday season.  She is with her family.  Her cough seems to be doing a lot better.  We put her on some antibiotic and steroid.  Both of these really seem to help quite a bit.  She still has a little bit of a cough but not nearly as bad.  She has had no problems with fever.  She has had no issues with COVID.  Her appetite has been quite good.  She ate quite a bit over the holiday season.  She has had no nausea or vomiting.  She needs MiraLAX for constipation.  There is been no leg swelling.  She has had no rashes.  Overall, her performance status is ECOG 1.       Medications:  Allergies as of 02/19/2021       Reactions   Bee Venom Swelling   Timolol Nausea Only   Brimonidine Tartrate Other (See Comments)   Codeine Other (See Comments)   Other Other (See Comments)   Brimonidine Nausea Only   Hydrocodone Itching, Rash, Other (See Comments)        Medication List        Accurate as of February 19, 2021 11:25 AM. If you have any questions, ask your nurse or doctor.          STOP taking these medications    amoxicillin 500 MG capsule Commonly known as: AMOXIL Stopped by: [name], MD   famotidine 40 MG tablet Commonly known as: PEPCID Stopped by: [name], MD   methylPREDNISolone 4 MG Tbpk tablet Commonly known as: MEDROL DOSEPAK Stopped by: [name], MD   sulfamethoxazole-trimethoprim 800-160 MG tablet Commonly known as: BACTRIM DS Stopped by: [name], MD       TAKE these medications    albuterol 108 (90 Base) MCG/ACT inhaler Commonly known as: VENTOLIN HFA SMARTSIG:1-2 Puff(s) By Mouth Every 4 Hours PRN   Benefiber Powd Take by mouth daily.   diphenhydrAMINE 25 MG tablet Commonly known as: BENADRYL Take 50 mg by mouth daily as needed (allergic reaction).   dorzolamide 2 % ophthalmic solution Commonly known as: TRUSOPT Place 1 drop into both eyes 2 (two) times daily.   durvalumab 120 MG/2.4ML Soln injection Commonly known as: IMFINZI Inject into the vein.   irbesartan 150 MG tablet Commonly known as: AVAPRO Take 150 mg by mouth daily.   levothyroxine 88 MCG tablet Commonly known as: SYNTHROID Take 88 mcg by mouth every morning.   lidocaine 5 % Commonly known as: LIDODERM Place 1 patch onto the skin daily. Remove & Discard patch within 12 hours or as directed by MD   lidocaine-prilocaine cream Commonly known as: EMLA Apply to affected area once   pantoprazole 40 MG tablet Commonly known as: PROTONIX Take 40 mg by mouth every morning.   Pfizer COVID-19 Vac Bivalent injection Generic drug: COVID-19 mRNA bivalent vaccine [employer]rapist, music) Inject into the muscle.   polyethylene glycol powder 17 GM/SCOOP powder Commonly known as: GLYCOLAX/MIRALAX  Take by mouth.   Spiriva Respimat 2.5 MCG/ACT Aers Generic drug: Tiotropium Bromide Monohydrate SMARTSIG:2 Puff(s) Via Inhaler Daily   travoprost (benzalkonium) 0.004 % ophthalmic solution Commonly known as: TRAVATAN Place 1 drop into both eyes at bedtime.   Vitamin D 50 MCG (2000 UT) tablet Take 2,000 Units by mouth daily with supper.        Allergies:  Allergies  Allergen Reactions   Bee Venom Swelling   Timolol Nausea Only   Brimonidine Tartrate Other (See Comments)   Codeine Other (See Comments)   Other Other (See Comments)   Brimonidine Nausea Only   Hydrocodone Itching, Rash and Other (See Comments)    Past Medical History, Surgical history,  Social history, and Family History were reviewed and updated.  Review of Systems: Review of Systems  Constitutional: Negative.   Eyes: Negative.   Respiratory:  Positive for cough.   Cardiovascular: Negative.   Gastrointestinal: Negative.   Genitourinary: Negative.   Musculoskeletal: Negative.   Skin: Negative.   Neurological: Negative.   Endo/Heme/Allergies: Negative.   Psychiatric/Behavioral: Negative.      Physical Exam:  weight is 92 lb (41.7 kg). Her oral temperature is 97.6 F (36.4 C). Her blood pressure is 125/56 (abnormal) and her pulse is 81. Her respiration is 17 and oxygen saturation is 100%.   Wt Readings from Last 3 Encounters:  02/19/21 92 lb (41.7 kg)  12/20/20 87 lb 12.8 oz (39.8 kg)  11/10/20 90 lb (40.8 kg)    Physical Exam Vitals reviewed.  HENT:     Head: Normocephalic and atraumatic.  Eyes:     Pupils: Pupils are equal, round, and reactive to light.  Cardiovascular:     Rate and Rhythm: Normal rate and regular rhythm.     Heart sounds: Normal heart sounds.  Pulmonary:     Effort: Pulmonary effort is normal.     Breath sounds: Normal breath sounds.  Abdominal:     General: Bowel sounds are normal.     Palpations: Abdomen is soft.  Musculoskeletal:        General: No tenderness or deformity. Normal range of motion.     Cervical back: Normal range of motion.  Lymphadenopathy:     Cervical: No cervical adenopathy.  Skin:    General: Skin is warm and dry.     Findings: No erythema or rash.  Neurological:     Mental Status: She is alert and oriented to person, place, and time.  Psychiatric:        Behavior: Behavior normal.        Thought Content: Thought content normal.        Judgment: Judgment normal.      Lab Results  Component Value Date   WBC 5.0 02/19/2021   HGB 12.1 02/19/2021   HCT 35.4 (L) 02/19/2021   MCV 92.4 02/19/2021   PLT 245 02/19/2021   Lab Results  Component Value Date   FERRITIN 311 (H) 05/25/2019   IRON 22 (L)  05/25/2019   TIBC 288 05/25/2019   UIBC 265 05/25/2019   IRONPCTSAT 8 (L) 05/25/2019   Lab Results  Component Value Date   RBC 3.83 (L) 02/19/2021   No results found for: KPAFRELGTCHN, LAMBDASER, KAPLAMBRATIO No results found for: IGGSERUM, IGA, IGMSERUM No results found for: TOTALPROTELP, ALBUMINELP, A1GS, A2GS, BETS, BETA2SER, GAMS, MSPIKE, SPEI   Chemistry      Component Value Date/Time   NA 131 (L) 02/19/2021 1022   K 3.9 02/19/2021 1022  CL 98 02/19/2021 1022   CO2 26 02/19/2021 1022   BUN 16 02/19/2021 1022   CREATININE 0.49 02/19/2021 1022      Component Value Date/Time   CALCIUM 9.7 02/19/2021 1022   ALKPHOS 56 02/19/2021 1022   AST 7 (L) 02/19/2021 1022   ALT 12 02/19/2021 1022   BILITOT 0.5 02/19/2021 1022       Impression and Plan: [name] is a very nice 76 yo caucasian female with locally advanced stage IIIa non-small cell carcinoma the right lower lung.      She received chemotherapy and radiation therapy with a very nice response.  We then went ahead and treat her with immunotherapy with Durvalumab.  We will set her up with another PET scan.  We will do this the end of February.  I think this will be a good interval from her last 1.  Hopefully, she will be in remission.  Hopefully, she will have been cured.  I will see her back in early March.  We will make sure her Port-A-Cath is flushed.   [name], MD 1/9/202311:25 AM

## 2021-02-28 ENCOUNTER — Other Ambulatory Visit: Payer: Self-pay | Admitting: *Deleted

## 2021-02-28 ENCOUNTER — Encounter: Payer: Self-pay | Admitting: *Deleted

## 2021-02-28 DIAGNOSIS — C3491 Malignant neoplasm of unspecified part of right bronchus or lung: Secondary | ICD-10-CM

## 2021-02-28 MED ORDER — AMOXICILLIN 500 MG PO CAPS: 2000.0000 mg | ORAL_CAPSULE | Freq: Once | ORAL | 3 refills | Status: DC | PRN

## 2021-03-26 ENCOUNTER — Encounter: Payer: Self-pay | Admitting: Hematology

## 2021-03-26 ENCOUNTER — Encounter: Payer: Self-pay | Admitting: Hematology & Oncology

## 2021-03-26 NOTE — Telephone Encounter (Signed)
Erroneous encounter

## 2021-04-09 ENCOUNTER — Ambulatory Visit (HOSPITAL_COMMUNITY)
Admission: RE | Admit: 2021-04-09 | Discharge: 2021-04-09 | Disposition: A | Discharge status: Home / Self Care | Payer: Medicare Other | Source: Ambulatory Visit | Attending: Hematology & Oncology | Admitting: Hematology & Oncology

## 2021-04-09 ENCOUNTER — Other Ambulatory Visit: Payer: Self-pay

## 2021-04-09 DIAGNOSIS — C3490 Malignant neoplasm of unspecified part of unspecified bronchus or lung: Secondary | ICD-10-CM | POA: Diagnosis not present

## 2021-04-09 DIAGNOSIS — C3491 Malignant neoplasm of unspecified part of right bronchus or lung: Secondary | ICD-10-CM | POA: Insufficient documentation

## 2021-04-09 DIAGNOSIS — J90 Pleural effusion, not elsewhere classified: Secondary | ICD-10-CM | POA: Insufficient documentation

## 2021-04-09 DIAGNOSIS — J929 Pleural plaque without asbestos: Secondary | ICD-10-CM | POA: Diagnosis not present

## 2021-04-09 DIAGNOSIS — J432 Centrilobular emphysema: Secondary | ICD-10-CM | POA: Diagnosis not present

## 2021-04-09 LAB — GLUCOSE, CAPILLARY: Glucose-Capillary: 83 mg/dL (ref 70–99)

## 2021-04-09 MED ORDER — FLUDEOXYGLUCOSE F - 18 (FDG) INJECTION
4.5000 | Freq: Once | INTRAVENOUS | Status: AC | PRN
  Administered 2021-04-09: 4.92 via INTRAVENOUS

## 2021-04-10 ENCOUNTER — Telehealth: Payer: Self-pay

## 2021-04-10 NOTE — Telephone Encounter (Signed)
Called and left message with patient and sent mychart message regarding PET results.

## 2021-04-10 NOTE — Telephone Encounter (Signed)
-----   Message from [name], MD sent at 04/10/2021 12:08 PM EST ----- Call and please let her know that the PET scan does not show any obvious recurrent cancer.  Great job.  [name]

## 2021-04-30 ENCOUNTER — Inpatient Hospital Stay: Payer: Medicare Other | Admitting: Hematology & Oncology

## 2021-04-30 ENCOUNTER — Encounter: Payer: Self-pay | Admitting: Hematology & Oncology

## 2021-04-30 ENCOUNTER — Inpatient Hospital Stay: Payer: Medicare Other

## 2021-04-30 ENCOUNTER — Inpatient Hospital Stay: Payer: Medicare Other | Attending: Hematology

## 2021-04-30 ENCOUNTER — Other Ambulatory Visit: Payer: Self-pay

## 2021-04-30 VITALS — BP 118/81 | HR 77 | Temp 97.8°F | Resp 18 | Ht 59.06 in | Wt 97.0 lb

## 2021-04-30 DIAGNOSIS — C3490 Malignant neoplasm of unspecified part of unspecified bronchus or lung: Secondary | ICD-10-CM | POA: Diagnosis not present

## 2021-04-30 DIAGNOSIS — Z9221 Personal history of antineoplastic chemotherapy: Secondary | ICD-10-CM | POA: Diagnosis not present

## 2021-04-30 DIAGNOSIS — Z923 Personal history of irradiation: Secondary | ICD-10-CM | POA: Diagnosis not present

## 2021-04-30 DIAGNOSIS — C3491 Malignant neoplasm of unspecified part of right bronchus or lung: Secondary | ICD-10-CM

## 2021-04-30 DIAGNOSIS — C3431 Malignant neoplasm of lower lobe, right bronchus or lung: Secondary | ICD-10-CM | POA: Insufficient documentation

## 2021-04-30 LAB — CMP (CANCER CENTER ONLY)
ALT: 12 U/L (ref 0–44)
AST: 9 U/L — ABNORMAL LOW (ref 15–41)
Albumin: 4.4 g/dL (ref 3.5–5.0)
Alkaline Phosphatase: 49 U/L (ref 38–126)
Anion gap: 7 (ref 5–15)
BUN: 15 mg/dL (ref 8–23)
CO2: 24 mmol/L (ref 22–32)
Calcium: 9.4 mg/dL (ref 8.9–10.3)
Chloride: 97 mmol/L — ABNORMAL LOW (ref 98–111)
Creatinine: 0.46 mg/dL (ref 0.44–1.00)
GFR, Estimated: >60 mL/min (ref >60)
Glucose, Bld: 101 mg/dL — ABNORMAL HIGH (ref 70–99)
Potassium: 4.1 mmol/L (ref 3.5–5.1)
Sodium: 128 mmol/L — ABNORMAL LOW (ref 135–145)
Total Bilirubin: 0.5 mg/dL (ref 0.3–1.2)
Total Protein: 6.9 g/dL (ref 6.5–8.1)

## 2021-04-30 LAB — CBC WITH DIFFERENTIAL (CANCER CENTER ONLY)
Abs Immature Granulocytes: 0.02 10*3/uL (ref 0.00–0.07)
Basophils Absolute: 0 10*3/uL (ref 0.0–0.1)
Basophils Relative: 1 %
Eosinophils Absolute: 0.1 10*3/uL (ref 0.0–0.5)
Eosinophils Relative: 3 %
HCT: 36.7 % (ref 36.0–46.0)
Hemoglobin: 12.5 g/dL (ref 12.0–15.0)
Immature Granulocytes: 0 %
Lymphocytes Relative: 17 %
Lymphs Abs: 0.9 10*3/uL (ref 0.7–4.0)
MCH: 31.5 pg (ref 26.0–34.0)
MCHC: 34.1 g/dL (ref 30.0–36.0)
MCV: 92.4 fL (ref 80.0–100.0)
Monocytes Absolute: 0.5 10*3/uL (ref 0.1–1.0)
Monocytes Relative: 10 %
Neutro Abs: 3.6 10*3/uL (ref 1.7–7.7)
Neutrophils Relative %: 69 %
Platelet Count: 212 10*3/uL (ref 150–400)
RBC: 3.97 MIL/uL (ref 3.87–5.11)
RDW: 12.8 % (ref 11.5–15.5)
WBC Count: 5.2 10*3/uL (ref 4.0–10.5)
nRBC: 0 % (ref 0.0–0.2)

## 2021-04-30 LAB — LACTATE DEHYDROGENASE: LDH: 135 U/L (ref 98–192)

## 2021-04-30 MED ORDER — HEPARIN SOD (PORK) LOCK FLUSH 100 UNIT/ML IV SOLN
500.0000 [IU] | Freq: Once | INTRAVENOUS | Status: AC
  Administered 2021-04-30: 500 [IU] via INTRAVENOUS

## 2021-04-30 MED ORDER — SODIUM CHLORIDE 0.9% FLUSH
10.0000 mL | Freq: Once | INTRAVENOUS | Status: AC
  Administered 2021-04-30: 10 mL via INTRAVENOUS

## 2021-04-30 NOTE — Progress Notes (Signed)
?Hematology and Oncology Follow Up Visit ? ?[name] ?[id number] ?1945-10-15 76 y.o. ?04/30/2021 ? ? ?Principle Diagnosis:  ?Stage IIIA (T4N0M0) squamous cell carcinoma the right lower lung ?  ?Past Therapy: ?Patient completed definitive chemoradiation therapy with weekly carboplatinum /Taxol - completed on 07/16/2019 ?  ?Current Therapy:        ?Durvalumab -- maintenance -- s/p cycle # 12 =--completed on 09/25/2020 ?  ?Interim History:  [name] is here today for follow-up.  So far, she is doing pretty well.  She is ready for the Spring weather. ? ?She had a PET scan done couple weeks ago.  The PET scan, did not show any evidence of any kind of recurrent disease.  There is some areas of slight activity which she had already had with the pleura.  We does have to watch this. ? ?She does have some incompletely healed bony fractures.  These are not malignant.   ? ? ?Thankfully, her breathing is doing pretty well.  She does have underlying COPD.  She is at risk for flareups of bronchitis. ? ?She has had no problems with nausea or vomiting.  She is eating okay.  She is quite petite. ? ?She has had no problems with fever.  She has had no bleeding.  She has had no change in bowel or bladder habits. ? ?Overall, I would say performance status is probably ECOG 1.   ? ?Medications:  ?Allergies as of 04/30/2021   ? ?   Reactions  ? Bee Venom Swelling, Other (See Comments)  ? Timolol Nausea Only  ? Brimonidine Tartrate Other (See Comments)  ? Codeine Other (See Comments)  ? Other Other (See Comments)  ? Brimonidine Nausea Only  ? Hydrocodone Itching, Rash, Other (See Comments)  ? ?  ? ?  ?Medication List  ?  ? ?  ? Accurate as of April 30, 2021 11:30 AM. If you have any questions, ask your nurse or doctor.  ?  ?  ? ?  ? ?albuterol 108 (90 Base) MCG/ACT inhaler ?Commonly known as: VENTOLIN HFA ?SMARTSIG:1-2 Puff(s) By Mouth Every 4 Hours PRN ?  ?amoxicillin 500 MG capsule ?Commonly known as: AMOXIL ?Take 4 capsules (2,000  mg total) by mouth once as needed for up to 1 dose. Take one hour before dental appointment ?  ?Benefiber Powd ?Take by mouth daily. ?  ?diphenhydrAMINE 25 MG tablet ?Commonly known as: BENADRYL ?Take 50 mg by mouth daily as needed (allergic reaction). ?  ?dorzolamide 2 % ophthalmic solution ?Commonly known as: TRUSOPT ?Place 1 drop into both eyes 2 (two) times daily. ?  ?durvalumab 120 MG/2.4ML Soln injection ?Commonly known as: IMFINZI ?Inject into the vein. ?  ?irbesartan 150 MG tablet ?Commonly known as: AVAPRO ?Take 150 mg by mouth daily. ?  ?levothyroxine 88 MCG tablet ?Commonly known as: SYNTHROID ?Take 88 mcg by mouth every morning. ?  ?lidocaine 5 % ?Commonly known as: LIDODERM ?Place 1 patch onto the skin daily. Remove & Discard patch within 12 hours or as directed by MD ?  ?lidocaine-prilocaine cream ?Commonly known as: EMLA ?Apply to affected area once ?  ?pantoprazole 40 MG tablet ?Commonly known as: PROTONIX ?Take 40 mg by mouth every morning. ?  ?Pfizer COVID-19 Vac Bivalent injection ?Generic drug: COVID-19 mRNA bivalent vaccine [employer]) ?Inject into the muscle. ?  ?polyethylene glycol powder 17 GM/SCOOP powder ?Commonly known as: GLYCOLAX/MIRALAX ?Take by mouth. ?  ?Spiriva Respimat 2.5 MCG/ACT Aers ?Generic drug: Tiotropium Bromide Monohydrate ?SMARTSIG:2 Puff(s) Via  Inhaler Daily ?  ?travoprost (benzalkonium) 0.004 % ophthalmic solution ?Commonly known as: TRAVATAN ?Place 1 drop into both eyes at bedtime. ?  ?Vitamin D 50 MCG (2000 UT) tablet ?Take 2,000 Units by mouth daily with supper. ?  ? ?  ? ? ?Allergies:  ?Allergies  ?Allergen Reactions  ? Bee Venom Swelling and Other (See Comments)  ? Timolol Nausea Only  ? Brimonidine Tartrate Other (See Comments)  ? Codeine Other (See Comments)  ? Other Other (See Comments)  ? Brimonidine Nausea Only  ? Hydrocodone Itching, Rash and Other (See Comments)  ? ? ?Past Medical History, Surgical history, Social history, and Family History were reviewed and  updated. ? ?Review of Systems: ?Review of Systems  ?Constitutional: Negative.   ?Eyes: Negative.   ?Respiratory:  Positive for cough.   ?Cardiovascular: Negative.   ?Gastrointestinal: Negative.   ?Genitourinary: Negative.   ?Musculoskeletal: Negative.   ?Skin: Negative.   ?Neurological: Negative.   ?Endo/Heme/Allergies: Negative.   ?Psychiatric/Behavioral: Negative.    ? ? ?Physical Exam: ? height is 4' 11.06" (1.5 m) and weight is 97 lb (44 kg). Her oral temperature is 97.8 ?F (36.6 ?C). Her blood pressure is 118/81 and her pulse is 77. Her respiration is 18 and oxygen saturation is 100%.  ? ?Wt Readings from Last 3 Encounters:  ?04/30/21 97 lb (44 kg)  ?02/19/21 92 lb (41.7 kg)  ?12/20/20 87 lb 12.8 oz (39.8 kg)  ? ? ?Physical Exam ?Vitals reviewed.  ?HENT:  ?   Head: Normocephalic and atraumatic.  ?Eyes:  ?   Pupils: Pupils are equal, round, and reactive to light.  ?Cardiovascular:  ?   Rate and Rhythm: Normal rate and regular rhythm.  ?   Heart sounds: Normal heart sounds.  ?Pulmonary:  ?   Effort: Pulmonary effort is normal.  ?   Breath sounds: Normal breath sounds.  ?Abdominal:  ?   General: Bowel sounds are normal.  ?   Palpations: Abdomen is soft.  ?Musculoskeletal:     ?   General: No tenderness or deformity. Normal range of motion.  ?   Cervical back: Normal range of motion.  ?Lymphadenopathy:  ?   Cervical: No cervical adenopathy.  ?Skin: ?   General: Skin is warm and dry.  ?   Findings: No erythema or rash.  ?Neurological:  ?   Mental Status: She is alert and oriented to person, place, and time.  ?Psychiatric:     ?   Behavior: Behavior normal.     ?   Thought Content: Thought content normal.     ?   Judgment: Judgment normal.  ? ?  ? ?Lab Results  ?Component Value Date  ? WBC 5.2 04/30/2021  ? HGB 12.5 04/30/2021  ? HCT 36.7 04/30/2021  ? MCV 92.4 04/30/2021  ? PLT 212 04/30/2021  ? ?Lab Results  ?Component Value Date  ? FERRITIN 311 (H) 05/25/2019  ? IRON 22 (L) 05/25/2019  ? TIBC 288 05/25/2019  ?  UIBC 265 05/25/2019  ? IRONPCTSAT 8 (L) 05/25/2019  ? ?Lab Results  ?Component Value Date  ? RBC 3.97 04/30/2021  ? ?No results found for: KPAFRELGTCHN, LAMBDASER, KAPLAMBRATIO ?No results found for: IGGSERUM, IGA, IGMSERUM ?No results found for: TOTALPROTELP, ALBUMINELP, A1GS, A2GS, BETS, BETA2SER, GAMS, MSPIKE, SPEI ?  Chemistry   ?   ?Component Value Date/Time  ? NA 128 (L) 04/30/2021 1020  ? K 4.1 04/30/2021 1020  ? CL 97 (L) 04/30/2021 1020  ? CO2 24  04/30/2021 1020  ? BUN 15 04/30/2021 1020  ? CREATININE 0.46 04/30/2021 1020  ?    ?Component Value Date/Time  ? CALCIUM 9.4 04/30/2021 1020  ? ALKPHOS 49 04/30/2021 1020  ? AST 9 (L) 04/30/2021 1020  ? ALT 12 04/30/2021 1020  ? BILITOT 0.5 04/30/2021 1020  ?  ? ? ? ?Impression and Plan: Ms. Pardi is a very nice 76 yo caucasian female with locally advanced stage IIIa non-small cell carcinoma the right lower lung.     ? ?She received chemotherapy and radiation therapy with a very nice response.  We then went ahead and treat her with immunotherapy with Durvalumab. ? ?We will set her up with another PET scan.  We will do this in June.  I think this would be a good time to do this..  She does have a Port-A-Cath in.  We had to make sure that we get this flushed in 6 weeks.   ? ?[name], MD ?3/20/202311:30 AM ? ?

## 2021-04-30 NOTE — Patient Instructions (Signed)

## 2021-04-30 NOTE — Addendum Note (Signed)
Addended by: Tyler Aas A on: 04/30/2021 11:36 AM ? ? Modules accepted: Orders ? ?

## 2021-05-28 DIAGNOSIS — Z03818 Encounter for observation for suspected exposure to other biological agents ruled out: Secondary | ICD-10-CM | POA: Diagnosis not present

## 2021-05-28 DIAGNOSIS — B349 Viral infection, unspecified: Secondary | ICD-10-CM | POA: Diagnosis not present

## 2021-06-01 ENCOUNTER — Other Ambulatory Visit: Payer: Self-pay

## 2021-06-01 ENCOUNTER — Emergency Department (HOSPITAL_BASED_OUTPATIENT_CLINIC_OR_DEPARTMENT_OTHER): Payer: Medicare Other

## 2021-06-01 ENCOUNTER — Encounter (HOSPITAL_BASED_OUTPATIENT_CLINIC_OR_DEPARTMENT_OTHER): Payer: Self-pay

## 2021-06-01 ENCOUNTER — Observation Stay (HOSPITAL_BASED_OUTPATIENT_CLINIC_OR_DEPARTMENT_OTHER)
Admission: EM | Admit: 2021-06-01 | Discharge: 2021-06-05 | Disposition: A | Discharge status: Home / Self Care | Payer: Medicare Other | Attending: Internal Medicine | Admitting: Internal Medicine

## 2021-06-01 DIAGNOSIS — Z87891 Personal history of nicotine dependence: Secondary | ICD-10-CM | POA: Insufficient documentation

## 2021-06-01 DIAGNOSIS — E039 Hypothyroidism, unspecified: Secondary | ICD-10-CM | POA: Diagnosis present

## 2021-06-01 DIAGNOSIS — J441 Chronic obstructive pulmonary disease with (acute) exacerbation: Principal | ICD-10-CM | POA: Diagnosis present

## 2021-06-01 DIAGNOSIS — I2583 Coronary atherosclerosis due to lipid rich plaque: Secondary | ICD-10-CM | POA: Diagnosis not present

## 2021-06-01 DIAGNOSIS — Z681 Body mass index (BMI) 19 or less, adult: Secondary | ICD-10-CM | POA: Diagnosis not present

## 2021-06-01 DIAGNOSIS — E440 Moderate protein-calorie malnutrition: Secondary | ICD-10-CM | POA: Diagnosis present

## 2021-06-01 DIAGNOSIS — R0602 Shortness of breath: Secondary | ICD-10-CM

## 2021-06-01 DIAGNOSIS — Z79899 Other long term (current) drug therapy: Secondary | ICD-10-CM | POA: Insufficient documentation

## 2021-06-01 DIAGNOSIS — Z8249 Family history of ischemic heart disease and other diseases of the circulatory system: Secondary | ICD-10-CM | POA: Diagnosis not present

## 2021-06-01 DIAGNOSIS — R9431 Abnormal electrocardiogram [ECG] [EKG]: Secondary | ICD-10-CM | POA: Diagnosis not present

## 2021-06-01 DIAGNOSIS — R059 Cough, unspecified: Secondary | ICD-10-CM | POA: Diagnosis not present

## 2021-06-01 DIAGNOSIS — R778 Other specified abnormalities of plasma proteins: Secondary | ICD-10-CM | POA: Diagnosis present

## 2021-06-01 DIAGNOSIS — I700 Atherosclerosis of aorta: Secondary | ICD-10-CM | POA: Diagnosis not present

## 2021-06-01 DIAGNOSIS — I5181 Takotsubo syndrome: Secondary | ICD-10-CM | POA: Diagnosis not present

## 2021-06-01 DIAGNOSIS — H409 Unspecified glaucoma: Secondary | ICD-10-CM | POA: Diagnosis present

## 2021-06-01 DIAGNOSIS — Z20822 Contact with and (suspected) exposure to covid-19: Secondary | ICD-10-CM | POA: Insufficient documentation

## 2021-06-01 DIAGNOSIS — Z85118 Personal history of other malignant neoplasm of bronchus and lung: Secondary | ICD-10-CM | POA: Diagnosis not present

## 2021-06-01 DIAGNOSIS — J449 Chronic obstructive pulmonary disease, unspecified: Secondary | ICD-10-CM | POA: Diagnosis present

## 2021-06-01 DIAGNOSIS — S2231XA Fracture of one rib, right side, initial encounter for closed fracture: Secondary | ICD-10-CM | POA: Diagnosis not present

## 2021-06-01 DIAGNOSIS — C3431 Malignant neoplasm of lower lobe, right bronchus or lung: Secondary | ICD-10-CM | POA: Diagnosis not present

## 2021-06-01 DIAGNOSIS — C3490 Malignant neoplasm of unspecified part of unspecified bronchus or lung: Secondary | ICD-10-CM | POA: Diagnosis present

## 2021-06-01 DIAGNOSIS — Z923 Personal history of irradiation: Secondary | ICD-10-CM | POA: Insufficient documentation

## 2021-06-01 DIAGNOSIS — E861 Hypovolemia: Secondary | ICD-10-CM | POA: Insufficient documentation

## 2021-06-01 DIAGNOSIS — Z9221 Personal history of antineoplastic chemotherapy: Secondary | ICD-10-CM | POA: Diagnosis not present

## 2021-06-01 DIAGNOSIS — I2510 Atherosclerotic heart disease of native coronary artery without angina pectoris: Secondary | ICD-10-CM | POA: Diagnosis not present

## 2021-06-01 DIAGNOSIS — E222 Syndrome of inappropriate secretion of antidiuretic hormone: Secondary | ICD-10-CM | POA: Insufficient documentation

## 2021-06-01 DIAGNOSIS — E871 Hypo-osmolality and hyponatremia: Secondary | ICD-10-CM | POA: Diagnosis not present

## 2021-06-01 DIAGNOSIS — E860 Dehydration: Secondary | ICD-10-CM | POA: Diagnosis present

## 2021-06-01 LAB — RESP PANEL BY RT-PCR (FLU A&B, COVID) ARPGX2
Influenza A by PCR: NEGATIVE
Influenza B by PCR: NEGATIVE
SARS Coronavirus 2 by RT PCR: NEGATIVE

## 2021-06-01 LAB — OSMOLALITY: Osmolality: 248 mOsm/kg — CL (ref 275–295)

## 2021-06-01 LAB — BLOOD GAS, VENOUS
Acid-Base Excess: 1.5 mmol/L (ref 0.0–2.0)
Bicarbonate: 25.9 mmol/L (ref 20.0–28.0)
O2 Saturation: 63.9 %
Patient temperature: 37
pCO2, Ven: 39 mmHg — ABNORMAL LOW (ref 44–60)
pH, Ven: 7.43 (ref 7.25–7.43)
pO2, Ven: 34 mmHg (ref 32–45)

## 2021-06-01 LAB — TSH: TSH: 0.685 u[IU]/mL (ref 0.350–4.500)

## 2021-06-01 LAB — CBC WITH DIFFERENTIAL/PLATELET
Abs Immature Granulocytes: 0.03 10*3/uL (ref 0.00–0.07)
Basophils Absolute: 0 10*3/uL (ref 0.0–0.1)
Basophils Relative: 0 %
Eosinophils Absolute: 0 10*3/uL (ref 0.0–0.5)
Eosinophils Relative: 0 %
HCT: 35.3 % — ABNORMAL LOW (ref 36.0–46.0)
Hemoglobin: 12.7 g/dL (ref 12.0–15.0)
Immature Granulocytes: 0 %
Lymphocytes Relative: 6 %
Lymphs Abs: 0.5 10*3/uL — ABNORMAL LOW (ref 0.7–4.0)
MCH: 30.9 pg (ref 26.0–34.0)
MCHC: 36 g/dL (ref 30.0–36.0)
MCV: 85.9 fL (ref 80.0–100.0)
Monocytes Absolute: 0.8 10*3/uL (ref 0.1–1.0)
Monocytes Relative: 11 %
Neutro Abs: 6.1 10*3/uL (ref 1.7–7.7)
Neutrophils Relative %: 83 %
Platelets: 187 10*3/uL (ref 150–400)
RBC: 4.11 MIL/uL (ref 3.87–5.11)
RDW: 12.3 % (ref 11.5–15.5)
WBC: 7.5 10*3/uL (ref 4.0–10.5)
nRBC: 0 % (ref 0.0–0.2)

## 2021-06-01 LAB — COMPREHENSIVE METABOLIC PANEL
ALT: 19 U/L (ref 0–44)
AST: 20 U/L (ref 15–41)
Albumin: 3.6 g/dL (ref 3.5–5.0)
Alkaline Phosphatase: 52 U/L (ref 38–126)
Anion gap: 9 (ref 5–15)
BUN: 11 mg/dL (ref 8–23)
CO2: 23 mmol/L (ref 22–32)
Calcium: 8.5 mg/dL — ABNORMAL LOW (ref 8.9–10.3)
Chloride: 86 mmol/L — ABNORMAL LOW (ref 98–111)
Creatinine, Ser: 0.32 mg/dL — ABNORMAL LOW (ref 0.44–1.00)
GFR, Estimated: >60 mL/min (ref >60)
Glucose, Bld: 104 mg/dL — ABNORMAL HIGH (ref 70–99)
Potassium: 3.8 mmol/L (ref 3.5–5.1)
Sodium: 118 mmol/L — CL (ref 135–145)
Total Bilirubin: 0.2 mg/dL — ABNORMAL LOW (ref 0.3–1.2)
Total Protein: 6.7 g/dL (ref 6.5–8.1)

## 2021-06-01 LAB — OSMOLALITY, URINE: Osmolality, Ur: 434 mOsm/kg (ref 300–900)

## 2021-06-01 LAB — SODIUM, URINE, RANDOM: Sodium, Ur: 27 mmol/L

## 2021-06-01 LAB — TROPONIN I (HIGH SENSITIVITY)
Troponin I (High Sensitivity): 180 ng/L (ref <18)
Troponin I (High Sensitivity): 171 ng/L (ref <18)

## 2021-06-01 LAB — BRAIN NATRIURETIC PEPTIDE: B Natriuretic Peptide: 869.6 pg/mL — ABNORMAL HIGH (ref 0.0–100.0)

## 2021-06-01 MED ORDER — LEVOTHYROXINE SODIUM 88 MCG PO TABS
88.0000 ug | ORAL_TABLET | Freq: Every day | ORAL | Status: DC
  Administered 2021-06-02 – 2021-06-05 (×4): 88 ug via ORAL
  Filled 2021-06-01 (×4): qty 1

## 2021-06-01 MED ORDER — TRAVOPROST (BAK FREE) 0.004 % OP SOLN
1.0000 [drp] | Freq: Every day | OPHTHALMIC | Status: DC
  Administered 2021-06-01 – 2021-06-04 (×4): 1 [drp] via OPHTHALMIC
  Filled 2021-06-01: qty 2.5

## 2021-06-01 MED ORDER — MOMETASONE FURO-FORMOTEROL FUM 200-5 MCG/ACT IN AERO
2.0000 | INHALATION_SPRAY | Freq: Two times a day (BID) | RESPIRATORY_TRACT | Status: DC
  Administered 2021-06-01 – 2021-06-05 (×8): 2 via RESPIRATORY_TRACT
  Filled 2021-06-01: qty 8.8

## 2021-06-01 MED ORDER — IRBESARTAN 300 MG PO TABS
150.0000 mg | ORAL_TABLET | Freq: Every day | ORAL | Status: DC
  Administered 2021-06-02 – 2021-06-05 (×4): 150 mg via ORAL
  Filled 2021-06-01 (×4): qty 1

## 2021-06-01 MED ORDER — NUTRISOURCE FIBER PO PACK
1.0000 | PACK | Freq: Every day | ORAL | Status: DC
  Administered 2021-06-02 – 2021-06-05 (×4): 1 via ORAL
  Filled 2021-06-01 (×5): qty 1

## 2021-06-01 MED ORDER — DORZOLAMIDE HCL 2 % OP SOLN
1.0000 [drp] | Freq: Two times a day (BID) | OPHTHALMIC | Status: DC
  Administered 2021-06-01 – 2021-06-05 (×8): 1 [drp] via OPHTHALMIC
  Filled 2021-06-01: qty 10

## 2021-06-01 MED ORDER — GUAIFENESIN ER 600 MG PO TB12
1200.0000 mg | ORAL_TABLET | Freq: Two times a day (BID) | ORAL | Status: DC
  Administered 2021-06-01 – 2021-06-02 (×2): 1200 mg via ORAL
  Filled 2021-06-01 (×2): qty 2

## 2021-06-01 MED ORDER — AZITHROMYCIN 250 MG PO TABS
500.0000 mg | ORAL_TABLET | Freq: Every day | ORAL | Status: AC
  Administered 2021-06-02 – 2021-06-05 (×4): 500 mg via ORAL
  Filled 2021-06-01 (×5): qty 2

## 2021-06-01 MED ORDER — SODIUM CHLORIDE 0.9 % IV SOLN: INTRAVENOUS | Status: AC

## 2021-06-01 MED ORDER — SODIUM CHLORIDE 1 G PO TABS
1.0000 g | ORAL_TABLET | Freq: Two times a day (BID) | ORAL | Status: DC
  Administered 2021-06-01 – 2021-06-04 (×6): 1 g via ORAL
  Filled 2021-06-01 (×7): qty 1

## 2021-06-01 MED ORDER — ENSURE ENLIVE PO LIQD
237.0000 mL | Freq: Two times a day (BID) | ORAL | Status: DC
  Administered 2021-06-02 – 2021-06-04 (×2): 237 mL via ORAL

## 2021-06-01 MED ORDER — SODIUM CHLORIDE 1 G PO TABS
1.0000 g | ORAL_TABLET | Freq: Two times a day (BID) | ORAL | Status: DC
  Filled 2021-06-01: qty 1

## 2021-06-01 MED ORDER — MELATONIN 3 MG PO TABS: 3.0000 mg | ORAL_TABLET | Freq: Every evening | ORAL | Status: DC | PRN

## 2021-06-01 MED ORDER — FUROSEMIDE 10 MG/ML IJ SOLN
20.0000 mg | Freq: Once | INTRAMUSCULAR | Status: AC
  Administered 2021-06-01: 20 mg via INTRAVENOUS
  Filled 2021-06-01: qty 2

## 2021-06-01 MED ORDER — BUDESONIDE 0.5 MG/2ML IN SUSP
2.0000 mg | Freq: Two times a day (BID) | RESPIRATORY_TRACT | Status: DC
  Administered 2021-06-01 – 2021-06-02 (×2): 2 mg via RESPIRATORY_TRACT
  Filled 2021-06-01 (×3): qty 8

## 2021-06-01 MED ORDER — BENZONATATE 100 MG PO CAPS
100.0000 mg | ORAL_CAPSULE | Freq: Three times a day (TID) | ORAL | Status: DC
  Administered 2021-06-01 – 2021-06-05 (×10): 100 mg via ORAL
  Filled 2021-06-01 (×11): qty 1

## 2021-06-01 MED ORDER — IPRATROPIUM-ALBUTEROL 0.5-2.5 (3) MG/3ML IN SOLN
3.0000 mL | Freq: Four times a day (QID) | RESPIRATORY_TRACT | Status: DC
  Administered 2021-06-01 – 2021-06-04 (×12): 3 mL via RESPIRATORY_TRACT
  Filled 2021-06-01 (×12): qty 3

## 2021-06-01 MED ORDER — SODIUM CHLORIDE 0.9 % IV SOLN: INTRAVENOUS | Status: DC

## 2021-06-01 MED ORDER — SODIUM CHLORIDE 0.9 % IV SOLN
500.0000 mg | INTRAVENOUS | Status: AC
  Administered 2021-06-01: 500 mg via INTRAVENOUS
  Filled 2021-06-01: qty 5

## 2021-06-01 MED ORDER — ENOXAPARIN SODIUM 40 MG/0.4ML IJ SOSY
40.0000 mg | PREFILLED_SYRINGE | INTRAMUSCULAR | Status: DC
  Administered 2021-06-02: 40 mg via SUBCUTANEOUS
  Filled 2021-06-01: qty 0.4

## 2021-06-01 MED ORDER — PANTOPRAZOLE SODIUM 40 MG PO TBEC
40.0000 mg | DELAYED_RELEASE_TABLET | Freq: Every morning | ORAL | Status: DC
  Administered 2021-06-02 – 2021-06-05 (×4): 40 mg via ORAL
  Filled 2021-06-01 (×4): qty 1

## 2021-06-01 MED ORDER — PREDNISONE 20 MG PO TABS
40.0000 mg | ORAL_TABLET | Freq: Every day | ORAL | Status: DC
  Administered 2021-06-01: 40 mg via ORAL
  Filled 2021-06-01: qty 2

## 2021-06-01 MED ORDER — ALBUTEROL SULFATE (2.5 MG/3ML) 0.083% IN NEBU: 2.5000 mg | INHALATION_SOLUTION | RESPIRATORY_TRACT | Status: DC | PRN

## 2021-06-01 MED ORDER — IOHEXOL 350 MG/ML SOLN
100.0000 mL | Freq: Once | INTRAVENOUS | Status: AC | PRN
  Administered 2021-06-01: 75 mL via INTRAVENOUS

## 2021-06-01 MED ORDER — IPRATROPIUM-ALBUTEROL 0.5-2.5 (3) MG/3ML IN SOLN
3.0000 mL | Freq: Once | RESPIRATORY_TRACT | Status: AC
  Administered 2021-06-01: 3 mL via RESPIRATORY_TRACT
  Filled 2021-06-01: qty 3

## 2021-06-01 NOTE — Progress Notes (Signed)
[facility] night cross cover note: ? ?I was notified by RN regarding patient's request for a sleep aid.  I subsequently placed order for prn melatonin. ? ? ? ?Babs Bertin, DO ?Hospitalist ? ?

## 2021-06-01 NOTE — ED Notes (Signed)
ED Provider at bedside. 

## 2021-06-01 NOTE — ED Provider Notes (Signed)
? ?Emergency Department Provider Note ? ? ?I have reviewed the triage vital signs and the nursing notes. ? ? ?HISTORY ? ?Chief Complaint ?Shortness of Breath ? ? ?HPI ?[name] is a 76 y.o. female with PMH of COPD and right lung cancer in remission presents to the ED with SOB and cough worsening over the last 2 days. No fever. Patient notes a mild productive cough but no hemoptysis. No CP/pressure. No abdominal pain or vomiting. Patient tried albuterol this AM with minimal relief.  ? ? ?Past Medical History:  ?Diagnosis Date  ? Dyspnea   ? Glaucoma   ? Headache   ? Hypothyroidism   ? Right lower lobe lung mass   ? Thyroid condition   ? ? ?Review of Systems ? ?Constitutional: No fever/chills ?Cardiovascular: Denies chest pain. ?Respiratory: Positive shortness of breath and cough.  ?Gastrointestinal: No abdominal pain.  ?Musculoskeletal: Negative for back pain. ?Skin: Negative for rash. ?Neurological: Negative for headaches. ? ? ?____________________________________________ ? ? ?PHYSICAL EXAM: ? ?VITAL SIGNS: ?ED Triage Vitals  ?Enc Vitals Group  ?   BP 06/01/21 1216 (!) 155/84  ?   Pulse Rate 06/01/21 1216 98  ?   Resp 06/01/21 1216 (!) 26  ?   Temp 06/01/21 1215 97.9 ?F (36.6 ?C)  ?   Temp Source 06/01/21 1215 Oral  ?   SpO2 06/01/21 1216 94 %  ?   Weight 06/01/21 1215 96 lb 3.2 oz (43.6 kg)  ?   Height 06/01/21 1215 4' 11.5" (1.511 m)  ? ?Constitutional: Alert and oriented. Well appearing and in no acute distress. ?Eyes: Conjunctivae are normal.  ?Head: Atraumatic. ?Nose: No congestion/rhinnorhea. ?Mouth/Throat: Mucous membranes are moist.   ?Neck: No stridor.   ?Cardiovascular: Normal rate, regular rhythm. Good peripheral circulation. Grossly normal heart sounds.   ?Respiratory: Increased respiratory effort.  No retractions. Lungs with mild end-expiratory wheezing. Good air entry. No rhonchi.  ?Gastrointestinal: Soft and nontender. No distention.  ?Musculoskeletal: No lower extremity tenderness nor edema.  No gross deformities of extremities. ?Neurologic:  Normal speech and language.  ?Skin:  Skin is warm, dry and intact. No rash noted. ? ?____________________________________________ ?  ?LABS ?(all labs ordered are listed, but only abnormal results are displayed) ? ?Labs Reviewed  ?COMPREHENSIVE METABOLIC PANEL - Abnormal; Notable for the following components:  ?    Result Value  ? Sodium 118 (*)   ? Chloride 86 (*)   ? Glucose, Bld 104 (*)   ? Creatinine, Ser 0.32 (*)   ? Calcium 8.5 (*)   ? Total Bilirubin 0.2 (*)   ? All other components within normal limits  ?BRAIN NATRIURETIC PEPTIDE - Abnormal; Notable for the following components:  ? B Natriuretic Peptide 869.6 (*)   ? All other components within normal limits  ?CBC WITH DIFFERENTIAL/PLATELET - Abnormal; Notable for the following components:  ? HCT 35.3 (*)   ? Lymphs Abs 0.5 (*)   ? All other components within normal limits  ?TROPONIN I (HIGH SENSITIVITY) - Abnormal; Notable for the following components:  ? Troponin I (High Sensitivity) 180 (*)   ? All other components within normal limits  ?RESP PANEL BY RT-PCR (FLU A&B, COVID) [id number]  ?TROPONIN I (HIGH SENSITIVITY)  ? ?____________________________________________ ? ?EKG ? ? EKG Interpretation ? ?Date/Time:  Friday June 01 2021 12:18:59 EDT ?Ventricular Rate:  97 ?PR Interval:  151 ?QRS Duration: 104 ?QT Interval:  386 ?QTC Calculation: 491 ?R Axis:   16 ?Text Interpretation: Sinus rhythm Atrial  premature complex Probable left atrial enlargement Abnormal T, consider ischemia, diffuse leads T wave changes new from July 2022 Confirmed by Nanda Quinton [phone]) on 06/01/2021 12:25:23 PM ?  ? ?  ? ? ?____________________________________________ ? ?RADIOLOGY ? ?CT Angio Chest PE W and/or Wo Contrast ? ?Result Date: 06/01/2021 ?CLINICAL DATA:  Cough and shortness of breath. Non-small cell lung carcinoma. * Tracking Code: BO * EXAM: CT ANGIOGRAPHY CHEST WITH CONTRAST TECHNIQUE: Multidetector CT imaging of the chest was  performed using the standard protocol during bolus administration of intravenous contrast. Multiplanar CT image reconstructions and MIPs were obtained to evaluate the vascular anatomy. RADIATION DOSE REDUCTION: This exam was performed according to the departmental dose-optimization program which includes automated exposure control, adjustment of the mA and/or kV according to patient size and/or use of iterative reconstruction technique. CONTRAST:  51mL OMNIPAQUE IOHEXOL 350 MG/ML SOLN COMPARISON:  PET-CT on 04/09/2021 FINDINGS: Cardiovascular: Satisfactory opacification of pulmonary arteries noted, and no pulmonary emboli identified. No evidence of thoracic aortic dissection or aneurysm. Aortic atherosclerotic calcification noted. Mediastinum/Nodes: No masses or pathologically enlarged lymph nodes identified. Lungs/Pleura: Stable post treatment changes in the right paramediastinal lung zone. Moderate to severe centrilobular emphysema again seen. Mass in the superior segment of the right lower lobe measuring 3.8 x 2.0 cm remains stable. Small right pleural effusion is stable. No new or enlarging pulmonary nodules or masses identified. Upper abdomen: No acute findings. Musculoskeletal: No suspicious bone lesions identified. Benign-appearing subacute fractures of the right posterolateral 8th and 9th ribs are again seen. Several other old right rib fracture deformities are noted. Review of the MIP images confirms the above findings. IMPRESSION: No evidence of pulmonary embolism or other acute findings. Stable right lung mass in the superior segment of the right lower lobe. No new or progressive disease identified. Stable small right pleural effusion. Stable subacute fractures of right posterolateral 8th and 9th ribs. Aortic Atherosclerosis (ICD10-I70.0) and Emphysema (ICD10-J43.9). Electronically Signed   By: Marlaine Hind M.D.   On: 06/01/2021 14:18  ? ?DG Chest Portable 1 View ? ?Result Date: 06/01/2021 ?CLINICAL DATA:   Shortness of breath. History of non-small-cell lung cancer EXAM: PORTABLE CHEST 1 VIEW COMPARISON:  04/10/2021 PET. Most recent chest radiograph 01/26/2020 FINDINGS: Right Port-A-Cath tip at superior caval/atrial junction. Numerous leads and wires project over the chest. At least 1 remote right rib fracture. Patient rotated right. Volume loss in the right hemithorax. Normal heart size. Underlying hyperinflation. Trace right pleural fluid and thickening may be slightly increased. No pneumothorax. Treated right infrahilar lung lesion is no longer identified. There is regional interstitial thickening which is likely treatment related. Minimal volume loss at the right lung base. No lobar consolidation. IMPRESSION: Treated right-sided primary bronchogenic carcinoma, with resultant volume loss. Slight increase in trace right pleural fluid and thickening. No acute superimposed process. Electronically Signed   By: Abigail Miyamoto M.D.   On: 06/01/2021 12:37   ? ?____________________________________________ ? ? ?PROCEDURES ? ?Procedure(s) performed:  ? ?.Critical Care ?Performed by: Margette Fast, MD ?Authorized by: Margette Fast, MD  ? ?Critical care provider statement:  ?  Critical care time (minutes):  35 ?  Critical care time was exclusive of:  Separately billable procedures and treating other patients and teaching time ?  Critical care was necessary to treat or prevent imminent or life-threatening deterioration of the following conditions:  Metabolic crisis ?  Critical care was time spent personally by me on the following activities:  Development of treatment plan with patient  or surrogate, discussions with consultants, evaluation of patient's response to treatment, examination of patient, ordering and review of laboratory studies, ordering and review of radiographic studies, ordering and performing treatments and interventions, pulse oximetry, re-evaluation of patient's condition, review of old charts, obtaining history  from patient or surrogate and blood draw for specimens ?  I assumed direction of critical care for this patient from another provider in my specialty: no   ?  Care discussed with: admitting provid

## 2021-06-01 NOTE — H&P (Signed)
?History and Physical  ? ? ?[name] YBR:[id number] DOB: [birth year] DOA: 06/01/2021 ? ?PCP: Lawerance Cruel, MD (Confirm with patient/family/NH records and if not entered, this has to be entered at [facility] point of entry) ?Patient coming from: Home ? ?I have personally briefly reviewed patient's old medical records in [place] ? ?Chief Complaint: Cough, wheezing, shortness of breath. ? ?HPI: [name] is a 76 y.o. female with medical history significant of stage III non-small cell lung cancer right side status post chemo and radiation and immunotherapy, in remission, COPD Gold stage III, chronic hyponatremia, hypothyroidism, glaucoma, presented with new onset of cough wheezing shortness of breath. ? ?Patient has COPD, but only uses Spiriva, no rescue medications.  Her daughter visited her on the weekend, and the daughter had some URI symptoms including congestion, postnasal drip and cough.  Patient herself started to have productive cough and wheezing this Monday, with occasional clear phlegm production, no chest pains.  She also developed a wheezing and exertional dyspnea, but denies any peripheral edema.  She went to see her PCP on Tuesday, and was started on Mucinex with minimal effect.  She also uses over-the-counter NiQil but of little help.  Her oral intake including food and fluid significant decreased for last 4 days due to significant shortness of breath.  She feels very dehydrated but denies any fatigue or muscle aching vertigo or lightheadedness. ? ?ED Course: No tachycardia no hypotension, O2 saturation ranging 93-100% on room air. ? ?CT angiogram negative for PE but chronic right lower lung changes compatible with her history of lung cancer. ? ?Troponin 180> 171.  EKG showed diffused T wave changes on limb and chest leads.  Cardiology aware. ? ?For hyponatremia, patient was given IV Lasix x1. ? ?Review of Systems: As per HPI otherwise 14 point review of systems negative.  ? ? ?Past  Medical History:  ?Diagnosis Date  ? Dyspnea   ? Glaucoma   ? Headache   ? Hypothyroidism   ? Right lower lobe lung mass   ? Thyroid condition   ? ? ?Past Surgical History:  ?Procedure Laterality Date  ? BIOPSY  05/11/2019  ? Procedure: BIOPSY;  Surgeon: Laurin Coder, MD;  Location: [facility];  Service: Pulmonary;;  ? BRONCHIAL WASHINGS  05/11/2019  ? Procedure: BRONCHIAL WASHINGS;  Surgeon: Laurin Coder, MD;  Location: [facility];  Service: Pulmonary;;  ? CATARACT EXTRACTION, BILATERAL    ? 1 was a lens implant  ? COLONOSCOPY    ? FINE NEEDLE ASPIRATION  05/11/2019  ? Procedure: FINE NEEDLE ASPIRATION (FNA) LINEAR;  Surgeon: Laurin Coder, MD;  Location: [facility];  Service: Pulmonary;;  ? IR IMAGING GUIDED PORT INSERTION  05/27/2019  ? MEDIASTINOSCOPY N/A 05/21/2019  ? Procedure: MEDIASTINOSCOPY;  Surgeon: Grace Isaac, MD;  Location: [facility];  Service: Thoracic;  Laterality: N/A;  ? SHOULDER SURGERY    ? VIDEO BRONCHOSCOPY WITH ENDOBRONCHIAL ULTRASOUND N/A 05/11/2019  ? Procedure: VIDEO BRONCHOSCOPY WITH ENDOBRONCHIAL ULTRASOUND;  Surgeon: Laurin Coder, MD;  Location: [facility];  Service: Pulmonary;  Laterality: N/A;  ? VIDEO BRONCHOSCOPY WITH ENDOBRONCHIAL ULTRASOUND N/A 05/21/2019  ? Procedure: VIDEO BRONCHOSCOPY WITH ENDOBRONCHIAL ULTRASOUND;  Surgeon: Grace Isaac, MD;  Location: [facility];  Service: Thoracic;  Laterality: N/A;  ? vocal cord polyp removal    ? WISDOM TOOTH EXTRACTION    ? ? ? reports that she quit smoking about 11 years ago. Her smoking use included cigarettes. She has  never used smokeless tobacco. She reports current alcohol use. She reports that she does not use drugs. ? ?Allergies  ?Allergen Reactions  ? Bee Venom Swelling and Other (See Comments)  ? Timolol Nausea Only  ? Brimonidine Tartrate Other (See Comments)  ? Codeine Other (See Comments)  ? Other Other (See Comments)  ? Brimonidine Nausea Only  ? Hydrocodone Itching, Rash and Other (See Comments)   ? ? ?No family history on file. ? ?Prior to Admission medications   ?Medication Sig Start Date End Date Taking? Authorizing Provider  ?albuterol (VENTOLIN HFA) 108 (90 Base) MCG/ACT inhaler SMARTSIG:1-2 Puff(s) By Mouth Every 4 Hours PRN ?Patient not taking: Reported on 12/20/2020 11/17/20   [provider]  ?amoxicillin (AMOXIL) 500 MG capsule Take 4 capsules (2,000 mg total) by mouth once as needed for up to 1 dose. Take one hour before dental appointment 02/28/21   [name], MD  ?Cholecalciferol (VITAMIN D) 50 MCG (2000 UT) tablet Take 2,000 Units by mouth daily with supper.    [provider]  ?COVID-19 mRNA bivalent vaccine, Pfizer, (PFIZER COVID-19 VAC BIVALENT) injection Inject into the muscle. 12/19/20   Carlyle Basques, MD  ?diphenhydrAMINE (BENADRYL) 25 MG tablet Take 50 mg by mouth daily as needed (allergic reaction). ?Patient not taking: No sig reported    [provider]  ?dorzolamide (TRUSOPT) 2 % ophthalmic solution Place 1 drop into both eyes 2 (two) times daily.     [provider]  ?durvalumab (IMFINZI) 120 MG/2.4ML SOLN injection Inject into the vein. ?Patient not taking: Reported on 12/20/2020    [provider]  ?irbesartan (AVAPRO) 150 MG tablet Take 150 mg by mouth daily. 11/16/20   [provider]  ?levothyroxine (SYNTHROID) 88 MCG tablet Take 88 mcg by mouth every morning. 11/01/20   [provider]  ?lidocaine (LIDODERM) 5 % Place 1 patch onto the skin daily. Remove & Discard patch within 12 hours or as directed by MD ?Patient not taking: Reported on 12/20/2020 01/26/20   [name], MD  ?lidocaine-prilocaine (EMLA) cream Apply to affected area once 09/29/19   [name], MD  ?pantoprazole (PROTONIX) 40 MG tablet Take 40 mg by mouth every morning. 09/08/20   [provider]  ?polyethylene glycol powder (GLYCOLAX/MIRALAX) 17 GM/SCOOP powder Take by mouth.    [provider]  ?SPIRIVA RESPIMAT 2.5  MCG/ACT AERS SMARTSIG:2 Puff(s) Via Inhaler Daily 11/21/20   [provider]  ?travoprost, benzalkonium, (TRAVATAN) 0.004 % ophthalmic solution Place 1 drop into both eyes at bedtime.    [provider]  ?Wheat Dextrin (BENEFIBER) POWD Take by mouth daily.    [provider]  ? ? ?Physical Exam: ?Vitals:  ? 06/01/21 1500 06/01/21 1516 06/01/21 1632 06/01/21 1817  ?BP: (!) 100/54 (!) 107/52 113/64 137/65  ?Pulse: 85 82 90 89  ?Resp: 18 18 (!) 21 20  ?Temp:  98.1 ?F (36.7 ?C)  98.1 ?F (36.7 ?C)  ?TempSrc:  Oral  Oral  ?SpO2: 100% 98% 97% 92%  ?Weight:    42.3 kg  ?Height:    4\' 11"  (1.499 m)  ? ? ?Constitutional: NAD, calm, comfortable ?Vitals:  ? 06/01/21 1500 06/01/21 1516 06/01/21 1632 06/01/21 1817  ?BP: (!) 100/54 (!) 107/52 113/64 137/65  ?Pulse: 85 82 90 89  ?Resp: 18 18 (!) 21 20  ?Temp:  98.1 ?F (36.7 ?C)  98.1 ?F (36.7 ?C)  ?TempSrc:  Oral  Oral  ?SpO2: 100% 98% 97% 92%  ?Weight:  42.3 kg  ?Height:    4\' 11"  (1.499 m)  ? ?Eyes: PERRL, lids and conjunctivae normal ?ENMT: Mucous membranes are dry. Posterior pharynx clear of any exudate or lesions.Normal dentition.  ?Neck: normal, supple, no masses, no thyromegaly ?Respiratory: Diminished breathing sound bilaterally, diffused wheezing, no crackles, increasing breathing effort, talking in broken sentences. No accessory muscle use.  ?Cardiovascular: Regular rate and rhythm, no murmurs / rubs / gallops. No extremity edema. 2+ pedal pulses. No carotid bruits.  ?Abdomen: no tenderness, no masses palpated. No hepatosplenomegaly. Bowel sounds positive.  ?Musculoskeletal: no clubbing / cyanosis. No joint deformity upper and lower extremities. Good ROM, no contractures. Normal muscle tone.  ?Skin: no rashes, lesions, ulcers. No induration ?Neurologic: CN 2-12 grossly intact. Sensation intact, DTR normal. Strength 5/5 in all 4.  ?Psychiatric: Normal judgment and insight. Alert and oriented x 3. Normal mood.  ? ? ? ?Labs on Admission: I have  personally reviewed following labs and imaging studies ? ?CBC: ?Recent Labs  ?Lab 06/01/21 ?1242  ?WBC 7.5  ?NEUTROABS 6.1  ?HGB 12.7  ?HCT 35.3*  ?MCV 85.9  ?PLT 187  ? ?Basic Metabolic Panel: ?Recent Labs  ?L

## 2021-06-01 NOTE — ED Triage Notes (Signed)
Patient here POV from Home. ? ?Endorses SOB associated with Mildly/Moderately Productive Cough. Present and worsening since Monday.  ? ?No Pain. No Fevers. Mild Nausea. No Emesis. No Known Fevers. No Current Chemotherapy or Radiation Therapy. ? ?SOB in Triage. A&Ox4. GCS 15. BIB Wheelchair. ?

## 2021-06-01 NOTE — ED Notes (Signed)
Patient self ambulated herself to bathroom with out assistance  ?

## 2021-06-01 NOTE — ED Notes (Signed)
Attempted to give report to receiving nurse at [facility], unable to give report. Will call back ?

## 2021-06-01 NOTE — Progress Notes (Signed)
Plan of Care Note for accepted transfer ? ? ?Patient: [name] MRN: [id number]   DOA: 06/01/2021 ? ?Facility requesting transfer: [facility].  ?Requesting Provider: Nanda Quinton, MD. ?Reason for transfer: Hyponatremia and elevated troponin. ?Facility course: 75 year old female with a past medical history of COPD, right lung cancer in remission, glaucoma, hypothyroidism, leukopenia and anemia related to chemotherapy who presented to the emergency department with complaints of dyspnea and cough.  Work-up is significant for a sodium of 118 mmol/L.  She has some ST segment changes on EKG and her troponin level was 180 ng/L.  Cardiology requested transfer to [facility].   ? ?Plan of care: ?The patient is accepted for admission to Progressive unit, at [facility]. ? ?Author: ?Reubin Milan, MD ?06/01/2021 ? ?Check [email or web address] for on-call coverage. ? ?Nursing staff, Please call [place] number on Amion as soon as patient's arrival, so appropriate admitting provider can evaluate the pt. ?

## 2021-06-02 ENCOUNTER — Inpatient Hospital Stay (HOSPITAL_COMMUNITY): Payer: Medicare Other

## 2021-06-02 DIAGNOSIS — R778 Other specified abnormalities of plasma proteins: Secondary | ICD-10-CM | POA: Diagnosis not present

## 2021-06-02 DIAGNOSIS — J441 Chronic obstructive pulmonary disease with (acute) exacerbation: Principal | ICD-10-CM

## 2021-06-02 DIAGNOSIS — E440 Moderate protein-calorie malnutrition: Secondary | ICD-10-CM | POA: Diagnosis not present

## 2021-06-02 DIAGNOSIS — R0602 Shortness of breath: Secondary | ICD-10-CM

## 2021-06-02 DIAGNOSIS — E871 Hypo-osmolality and hyponatremia: Secondary | ICD-10-CM | POA: Diagnosis not present

## 2021-06-02 DIAGNOSIS — I2510 Atherosclerotic heart disease of native coronary artery without angina pectoris: Secondary | ICD-10-CM | POA: Diagnosis not present

## 2021-06-02 DIAGNOSIS — C3431 Malignant neoplasm of lower lobe, right bronchus or lung: Secondary | ICD-10-CM | POA: Diagnosis not present

## 2021-06-02 DIAGNOSIS — E039 Hypothyroidism, unspecified: Secondary | ICD-10-CM | POA: Diagnosis present

## 2021-06-02 LAB — BASIC METABOLIC PANEL
Anion gap: 8 (ref 5–15)
BUN: 7 mg/dL — ABNORMAL LOW (ref 8–23)
CO2: 22 mmol/L (ref 22–32)
Calcium: 8 mg/dL — ABNORMAL LOW (ref 8.9–10.3)
Chloride: 94 mmol/L — ABNORMAL LOW (ref 98–111)
Creatinine, Ser: 0.43 mg/dL — ABNORMAL LOW (ref 0.44–1.00)
GFR, Estimated: >60 mL/min (ref >60)
Glucose, Bld: 123 mg/dL — ABNORMAL HIGH (ref 70–99)
Potassium: 3.6 mmol/L (ref 3.5–5.1)
Sodium: 124 mmol/L — ABNORMAL LOW (ref 135–145)

## 2021-06-02 LAB — ECHOCARDIOGRAM COMPLETE
Area-P 1/2: 4.36 cm2
Calc EF: 57.3 %
Height: 59 in
S' Lateral: 2 cm
Single Plane A2C EF: 57.2 %
Single Plane A4C EF: 58.1 %
Weight: 1512 oz

## 2021-06-02 MED ORDER — GUAIFENESIN-DM 100-10 MG/5ML PO SYRP
5.0000 mL | ORAL_SOLUTION | ORAL | Status: DC | PRN
  Administered 2021-06-02 – 2021-06-05 (×2): 5 mL via ORAL
  Filled 2021-06-02 (×2): qty 5

## 2021-06-02 MED ORDER — METHYLPREDNISOLONE SODIUM SUCC 40 MG IJ SOLR
40.0000 mg | Freq: Every day | INTRAMUSCULAR | Status: DC
  Administered 2021-06-03 – 2021-06-04 (×2): 40 mg via INTRAVENOUS
  Filled 2021-06-02 (×2): qty 1

## 2021-06-02 MED ORDER — LORAZEPAM 0.5 MG PO TABS
0.5000 mg | ORAL_TABLET | Freq: Every day | ORAL | Status: DC
  Administered 2021-06-02 – 2021-06-04 (×3): 0.5 mg via ORAL
  Filled 2021-06-02 (×3): qty 1

## 2021-06-02 MED ORDER — CHLORHEXIDINE GLUCONATE CLOTH 2 % EX PADS
6.0000 | MEDICATED_PAD | Freq: Every day | CUTANEOUS | Status: DC
  Administered 2021-06-02 – 2021-06-05 (×3): 6 via TOPICAL

## 2021-06-02 MED ORDER — SALINE SPRAY 0.65 % NA SOLN
1.0000 | NASAL | Status: DC | PRN
  Filled 2021-06-02: qty 44

## 2021-06-02 MED ORDER — OXYMETAZOLINE HCL 0.05 % NA SOLN
1.0000 | Freq: Two times a day (BID) | NASAL | Status: DC
  Filled 2021-06-02: qty 30

## 2021-06-02 MED ORDER — FLUTICASONE PROPIONATE 50 MCG/ACT NA SUSP
2.0000 | Freq: Two times a day (BID) | NASAL | Status: DC
  Administered 2021-06-02 – 2021-06-05 (×3): 2 via NASAL
  Filled 2021-06-02: qty 16

## 2021-06-02 MED ORDER — ENOXAPARIN SODIUM 30 MG/0.3ML IJ SOSY
30.0000 mg | PREFILLED_SYRINGE | INTRAMUSCULAR | Status: DC
  Administered 2021-06-03 – 2021-06-05 (×3): 30 mg via SUBCUTANEOUS
  Filled 2021-06-02 (×3): qty 0.3

## 2021-06-02 NOTE — Progress Notes (Signed)
CCMD notified RN about a widen QRS/ VTACH 5 beat rhythm. RN assessed patient. Patient mentioned to RN that she had a coughing spell during that time. Patient states "feels fine and does not need anything". ?

## 2021-06-02 NOTE — Assessment & Plan Note (Addendum)
Hypovolemic hypo-osmolar hyponatremia ?Likely component of SIADH ? ?Patient received short course of IV fluids and then transitioned to salt tablets during her hospitalization. ?Her diet was liberated but added fluid restriction.  ? ?At the time of her discharge her serum Na is 133. Renal function with serum Na at 133, K 4,2 and serum bicarbonate at 27. ?Plan to follow up renal function as outpatient.  ?

## 2021-06-02 NOTE — Assessment & Plan Note (Signed)
Continue with levothyroxine  

## 2021-06-02 NOTE — Hospital Course (Addendum)
[name] was admitted to the hospital with the working diagnosis of COPD exacerbation ? ?76 yo female with the past medical history of stage 3 non small cell lung cancer, (on remission), COPD, hyponatremia, and hypothyroidism, who presented with dyspnea and wheezing. Reported 4 days of wheezing, dyspnea and cough, and poor oral intake. Positive sick contact at home. On her initial physical examination her blood pressure was 100/73, HR 85, RR 18, 02 saturation 100%, dry mucous membranes, heart with S1 and S2 present with no gallops or murmurs, lungs with decreased breath sounds bilaterally, diffuse wheezing, no rales, abdomen soft and not distended, no lower extremity edema.  ? ?Na 118, K 3,8 CL 86, bicarbonate 23, glucose 104, bun 11 cr 0,32 ?BNP 869  ?High sensitive troponin 180  ?Wbc 7,5 hgb 12,7 plt 187 ?Sars covid 19 negative  ? ?Chest radiograph with hyperinflation, scarring on right upper lobe with traction of mediastinum, volume loss.  ? ?CT chest with no pulmonary embolism, stable right lung mass, in the superior segment of the right lower lobe. Stable small right pleural effusion. Right rib fractures 8 and 9 posterior lateral.  ? ?EKG 97 bpm, normal axis, normal intervals, sinus rhythm with no significant ST segment changes, diffuse T wave inversions.  ? ?Patient was placed on bronchodilator therapy, inhaled and systemic corticosteroids with improvement of her symptoms.  ?Echocardiogram with LV preserved systolic function but with new wall motion abnormalities.  ?Consulted cardiology with recommendations of medical therapy and follow up as outpatient. ? ?Patient will continue with oral prednisone for 3 more days, follow up as outpatient.  ?

## 2021-06-02 NOTE — Progress Notes (Signed)
?Progress Note ? ? ?Patient: [name] ION:[id number] DOB: 05-31-1945 DOA: 06/01/2021     1 ?DOS: the patient was seen and examined on 06/02/2021 ?  ?Brief hospital course: ?[name] was admitted to the hospital with the working diagnosis of COPD exacerbation ? ?76 yo female with the past medical history of stage 3 non small cell lung cancer, (on remission), COPD, hyponatremia, and hypothyroidism, who presented with dyspnea and wheezing. Reported 4 days of wheezing, dyspnea and cough, and poor oral intake. Positive sick contact at home. On her initial physical examination her blood pressure was 100/73, HR 85, RR 18, 02 saturation 100%, dry mucous membranes, heart with S1 and S2 present with no gallops or murmurs, lungs with decreased breath sounds bilaterally, diffuse wheezing, no rales, abdomen soft and not distended, no lower extremity edema.  ? ?Na 118, K 3,8 CL 86, bicarbonate 23, glucose 104, bun 11 cr 0,32 ?BNP 869  ?High sensitive troponin 180  ?Wbc 7,5 hgb 12,7 plt 187 ?Sars covid 19 negative  ? ?Chest radiograph with hyperinflation, scarring on right upper lobe with traction of mediastinum, volume loss.  ? ?CT chest with no pulmonary embolism, stable right lung mass, in the superior segment of the right lower lobe. Stable small right pleural effusion. Right rib fractures 8 and 9 posterior lateral.  ? ?EKG 97 bpm, normal axis, normal intervals, sinus rhythm with no significant ST segment changes, diffuse T wave inversions.  ? ? ? ?Assessment and Plan: ?* COPD with acute exacerbation ([name]) ?Acute hypoxemic and hypercapnic respiratory failure.  ? ?Mild improvement in dyspnea but not back to her baseline ?02 saturation is 90% on room air. ?Elevated troponin due to COPD exacerbation.  ? ?Plan to continue aggressive bronchodilator therapy with duoneb, as needed and scheduled. ?Inhaled corticosteroids ?Airway clearing techniques with flutter valve and incentive spirometer. ?Azithromycin for airway  inflammation for 5 days.  ?Out of bed to chair tid with meals, PT and OT ?Nutrition consultation  ?Follow up on echocardiogram.  ? ?Add fonase, affrin and nasal saline for sinus congestion.  ? ?Hyponatremia ?Hypovolemic hyponatremia ? ?Na is up to 124 with K at 3,6 and serum bicarbonate at 22. Serum cr at 0,43. ? ?Continue close follow up on electrolytes, holding IV fluids for now. ?Unrestricted diet and follow up with nutrition recommendations.  ? ? ?Hypothyroidism ?Continue with levothyroxine  ? ?Lung cancer ([name]) ?Currently on remission sp radiation and chemotherapy.  ? ?Protein-calorie malnutrition, moderate ([name]) ?Continue with nutritional supplements ?Follow up with nutrition recommendations.  ? ? ? ? ?  ? ?Subjective: Patient with mild improvement in dyspnea but not back to baseline  ? ?Physical Exam: ?Vitals:  ? 06/02/21 0817 06/02/21 0824 06/02/21 0937 06/02/21 1403  ?BP:   (!) 110/47 (!) 108/54  ?Pulse: 86  94 84  ?Resp: 18  18 18   ?Temp:    97.6 ?F (36.4 ?C)  ?TempSrc:    Oral  ?SpO2: 100% 100% 95% 98%  ?Weight:      ?Height:      ? ?Neurology awake and alert ?ENT with mild pallor ?Cardiovascular with S1 and S2 present and rhythmic with no gallops or murmurs\ ?Respiratory with prolonged expiratory phase and scattered rhonchi, no wheezing ?Abdomen not distended ?No lower extremity edema  ?Data Reviewed: ? ? ? ?Family Communication: I spoke with patient's daughter at the bedside, we talked in detail about patient's condition, plan of care and prognosis and all questions were addressed. ? ? ?Disposition: ?Status is: Inpatient ?  Remains inpatient appropriate because: respiratory failure  ? Planned Discharge Destination: Home ? ?Author: ?Tawni Millers, MD ?06/02/2021 3:52 PM ? ?For on call review [email or web address].  ?

## 2021-06-02 NOTE — Progress Notes (Signed)
Echocardiogram not completed due to personal care. Will re-attempt at another time.   [name]

## 2021-06-02 NOTE — Assessment & Plan Note (Addendum)
?  Patient was admitted to the medical ward, she was placed on aggressive bronchodilatory therapy, inhaled and systemic corticosteroids with improvement in her symptoms.  ? ?Airway clearing techniques with flutter valve and incentive spirometer. ?Azithromycin for airway inflammation during her hospitalization.  ? ?Added fonase and nasal saline for sinus congestion.  ? ?At the time of her discharge her oxygenation is 98% on room air. ?Plan to continue with bronchodilator therapy and 3 more days of oral prednisone. ?If recurrent exacerbations to consider adding a inhaled corticosteroid.  ? ?

## 2021-06-02 NOTE — Progress Notes (Signed)
?  Echocardiogram ?2D Echocardiogram has been performed. ? ?[name] ?06/02/2021, 2:00 PM ?

## 2021-06-02 NOTE — Assessment & Plan Note (Signed)
Continue with nutritional supplements ?Follow up with nutrition recommendations.  ?

## 2021-06-02 NOTE — Assessment & Plan Note (Signed)
Currently on remission sp radiation and chemotherapy.  ?

## 2021-06-02 NOTE — Plan of Care (Signed)
?  Problem: Education: ?Goal: Knowledge of disease or condition will improve ?Outcome: Progressing ?  ?Problem: Activity: ?Goal: Ability to tolerate increased activity will improve ?Outcome: Progressing ?  ?Problem: Respiratory: ?Goal: Ability to maintain adequate ventilation will improve ?Outcome: Progressing ?  ?Problem: Health Behavior/Discharge Planning: ?Goal: Ability to manage health-related needs will improve ?Outcome: Progressing ?  ?

## 2021-06-03 DIAGNOSIS — E871 Hypo-osmolality and hyponatremia: Secondary | ICD-10-CM | POA: Diagnosis not present

## 2021-06-03 DIAGNOSIS — I2510 Atherosclerotic heart disease of native coronary artery without angina pectoris: Secondary | ICD-10-CM | POA: Diagnosis not present

## 2021-06-03 DIAGNOSIS — J441 Chronic obstructive pulmonary disease with (acute) exacerbation: Secondary | ICD-10-CM | POA: Diagnosis not present

## 2021-06-03 DIAGNOSIS — E440 Moderate protein-calorie malnutrition: Secondary | ICD-10-CM | POA: Diagnosis not present

## 2021-06-03 DIAGNOSIS — C3431 Malignant neoplasm of lower lobe, right bronchus or lung: Secondary | ICD-10-CM | POA: Diagnosis not present

## 2021-06-03 LAB — BASIC METABOLIC PANEL
Anion gap: 6 (ref 5–15)
BUN: 10 mg/dL (ref 8–23)
CO2: 25 mmol/L (ref 22–32)
Calcium: 8.5 mg/dL — ABNORMAL LOW (ref 8.9–10.3)
Chloride: 102 mmol/L (ref 98–111)
Creatinine, Ser: 0.49 mg/dL (ref 0.44–1.00)
GFR, Estimated: >60 mL/min (ref >60)
Glucose, Bld: 90 mg/dL (ref 70–99)
Potassium: 3.5 mmol/L (ref 3.5–5.1)
Sodium: 133 mmol/L — ABNORMAL LOW (ref 135–145)

## 2021-06-03 NOTE — Assessment & Plan Note (Addendum)
Suspected CAD.  ? ?Echocardiogram with preserved LV systolic function with EF 55 to 60% with moderate hypokinesis of the left ventricle apical segment, inferior segment and anterior segment. Preserved RV systolic function with RVSP 32.6 mmHg.  ?No significant valvular disease.  ? ?EKG with no ischemic changes. ?High sensitive troponin 180 and 171.  ?Patient chest pain free.  ? ?Considering new wall motion abnormalities on the left ventricle patient may need ischemic workup.  ? ?Consulted cardiology ? ? ? ?

## 2021-06-03 NOTE — Progress Notes (Signed)
Occupational Therapy Evaluation ?Patient Details ?Name: [name] ?MRN: [id number] ?DOB: [birth year] ?Today's Date: 06/03/2021 ? ?Clinical Impression: Pt is typically independent in ADL and mobility. She does the IADL inside (cooks/cleans etc). Today she is tired, but willing to work with OT. She completes bed mobility and transfers at mod I level, UB dressing with set up, LB dressing with min guard EOB. She ambulated with min guard to toilet, performed peri care and sink level grooming min guard. Pt on RA throughout and SpO2 >94%. Pt fatigued which is not normal for her, OT will follow acutely with focus on energy conservation and maximizing safety and independence in ADL and functional transfers. Please take energy conservation handout next session. Do not anticipate the need for post-acute OT at this time.  ? ? ? 06/03/21 1300  ?OT Visit Information  ?Last OT Received On 06/03/21  ?Assistance Needed +1  ?History of Present Illness [name] is a 76 yo female admitted 06/01/21 with wheezing, dyspnea, cough, and poor oral intake. Admitted with hyponatremia, hypovolemia and COPD exacerbation. Positive sick contact at home. CT angiogram negative for PE. PMH includes stage 3 non small cell lung cancer, (on remission), COPD.  ?Precautions  ?Precautions Fall  ?Restrictions  ?Weight Bearing Restrictions No  ?Home Living  ?Family/patient expects to be discharged to: Private residence  ?Living Arrangements Spouse/significant other  ?Available Help at Discharge Family;Available PRN/intermittently  ?Type of Home House  ?Home Access Level entry  ?Home Layout Laundry or work area in basement;Two level  ?Alternate Level Stairs-Number of Steps 1 flight  ?Bathroom Shower/Tub Walk-in shower;Tub/shower unit  ?Bathroom Toilet Standard  ?Home Equipment Cane - single point;Rolling Walker (2 wheels)  ?Prior Function  ?Prior Level of Function  Independent/Modified Independent  ?Mobility Comments Independent, drives,  cooks/cleans, no falls at home  ?ADLs Comments independent  ?Communication  ?Communication No difficulties  ?Pain Assessment  ?Pain Assessment No/denies pain  ?Cognition  ?Arousal/Alertness Awake/alert  ?Behavior During Therapy [facility] for tasks assessed/performed  ?Overall Cognitive Status Within Functional Limits for tasks assessed  ?Upper Extremity Assessment  ?Upper Extremity Assessment Generalized weakness  ?Lower Extremity Assessment  ?Lower Extremity Assessment Defer to PT evaluation  ?Cervical / Trunk Assessment  ?Cervical / Trunk Assessment Kyphotic  ?Vision- History  ?Patient Visual Report No change from baseline  ?Vision- Assessment  ?Vision Assessment? No apparent visual deficits  ?ADL  ?Overall ADL's  Needs assistance/impaired  ?Eating/Feeding Independent  ?Grooming Min guard;Standing;Wash/dry hands;Wash/dry face;Oral care  ?Grooming Details (indicate cue type and reason) decreased activity tolerance for standing grooming  ?Upper Body Bathing Min guard;Standing  ?Lower Body Bathing Min guard;Sit to/from stand  ?Upper Body Dressing  Set up;Sitting  ?Upper Body Dressing Details (indicate cue type and reason) extra gown like robe  ?Lower Body Dressing Min guard;Sit to/from stand  ?Lower Body Dressing Details (indicate cue type and reason) able to perform figure 4 - for socks  ?Surveyor, minerals guard;Ambulation  ?Toileting- Music therapist guard;Sit to/from stand  ?Functional mobility during ADLs Min guard  ?General ADL Comments several coughing fits, decreased activity tolerance, SpO2 >95% on RA throughout session  ?Bed Mobility  ?Overal bed mobility Modified Independent  ?General bed mobility comments HOB elevated, no assist.  ?Transfers  ?Overall transfer level Modified independent  ?Equipment used None  ?General transfer comment used grab bars since they were available  ?Balance  ?Overall balance assessment Needs assistance  ?Sitting-balance support Feet supported;No upper extremity  supported  ?Sitting balance-Leahy Scale Good  ?  Standing balance support During functional activity  ?Standing balance-Leahy Scale Fair  ?General Comments  ?General comments (skin integrity, edema, etc.) VSS on RA  ?OT - End of Session  ?Equipment Utilized During Treatment Gait belt  ?Activity Tolerance Patient tolerated treatment well  ?Patient left in bed;with call bell/phone within reach  ?Nurse Communication Mobility status  ?OT Assessment  ?OT Recommendation/Assessment Patient needs continued OT Services  ?OT Visit Diagnosis Muscle weakness (generalized) (M62.81);Other (comment) ?(cardiopulmonary status limiting activity)  ?OT Problem List Cardiopulmonary status limiting activity;Decreased activity tolerance;Impaired balance (sitting and/or standing)  ?OT Plan  ?OT Frequency (ACUTE ONLY) Min 2X/week  ?OT Treatment/Interventions (ACUTE ONLY) Self-care/ADL training;Energy conservation;Therapeutic activities;Patient/family education;Balance training  ?AM-PAC OT "6 Clicks" Daily Activity Outcome Measure (Version 2)  ?Help from another person eating meals? 4  ?Help from another person taking care of personal grooming? 3  ?Help from another person toileting, which includes using toliet, bedpan, or urinal? 3  ?Help from another person bathing (including washing, rinsing, drying)? 3  ?Help from another person to put on and taking off regular upper body clothing? 4  ?Help from another person to put on and taking off regular lower body clothing? 3  ?6 Click Score 20  ?Progressive Mobility  ?What is the highest level of mobility based on the progressive mobility assessment? Level 5 (Walks with assist in room/hall) - Balance while stepping forward/back and can walk in room with assist - Complete  ?Activity Ambulated with assistance in room  ?OT Recommendation  ?Recommendations for Other Services PT consult ?(mobility tech team)  ?Follow Up Recommendations No OT follow up  ?Assistance recommended at discharge PRN  ?Patient  can return home with the following Assistance with cooking/housework;A little help with bathing/dressing/bathroom;Help with stairs or ramp for entrance  ?Functional Status Assessent Patient has had a recent decline in their functional status and demonstrates the ability to make significant improvements in function in a reasonable and predictable amount of time.  ?OT Equipment BSC/3in1  ?Individuals Consulted  ?Consulted and Agree with Results and Recommendations Patient  ?Acute Rehab OT Goals  ?Patient Stated Goal get energy back  ?OT Goal Formulation With patient  ?Time For Goal Achievement 06/17/21  ?Potential to Achieve Goals Good  ?OT Time Calculation  ?OT Start Time (ACUTE ONLY) 1216  ?OT Stop Time (ACUTE ONLY) 1237  ?OT Time Calculation (min) 21 min  ?OT General Charges  ?$OT Visit 1 Visit  ?OT Evaluation  ?$OT Eval Low Complexity 1 Low  ?Written Expression  ?Dominant Hand Right  ? ?Jesse Sans OTR/L ?Acute Rehabilitation Services ?Pager: [phone] ?Office: [phone] ? ?

## 2021-06-03 NOTE — Evaluation (Signed)
Physical Therapy Evaluation ?Patient Details ?Name: [name] ?MRN: [id number] ?DOB: 1945-10-08 ?Today's Date: 06/03/2021 ? ?History of Present Illness ? [name] is a 76 yo female admitted 06/01/21 with wheezing, dyspnea, cough, and poor oral intake. Admitted with hyponatremia, hypovolemia and COPD exacerbation. Positive sick contact at home. CT angiogram negative for PE. PMH includes stage 3 non small cell lung cancer, (on remission), COPD.  ?Clinical Impression ? Patient presents with productive cough, generalized weakness, impaired balance and impaired mobility s/p above. Pt lives at home with her spouse and is independent for ADLs/IADLs PTA. Today, pt tolerated transfers and gait training with Min guard assist due to drifting and mild balance deficits, but no overt LOB. VSS on RA. Reports feeling at 30% of functional baseline. Will follow acutely for higher level balance activities and to maximize independence and mobility prior to return home. Will likely improve quickly with increased activity.    ?   ? ?Recommendations for follow up therapy are one component of a multi-disciplinary discharge planning process, led by the attending physician.  Recommendations may be updated based on patient status, additional functional criteria and insurance authorization. ? ?Follow Up Recommendations No PT follow up ? ?  ?Assistance Recommended at Discharge PRN  ?Patient can return home with the following ? Help with stairs or ramp for entrance;Assistance with cooking/housework;Assist for transportation ? ?  ?Equipment Recommendations None recommended by PT  ?Recommendations for Other Services ?    ?  ?Functional Status Assessment Patient has had a recent decline in their functional status and demonstrates the ability to make significant improvements in function in a reasonable and predictable amount of time.  ? ?  ?Precautions / Restrictions Precautions ?Precautions: Fall ?Restrictions ?Weight Bearing  Restrictions: No  ? ?  ? ?Mobility ? Bed Mobility ?Overal bed mobility: Modified Independent ?  ?  ?  ?  ?  ?  ?General bed mobility comments: HOB elevated, no assist. ?  ? ?Transfers ?Overall transfer level: Modified independent ?Equipment used: None ?  ?  ?  ?  ?  ?  ?  ?General transfer comment: Stood from EOB x1, from toilet x1, no difficulties. ?  ? ?Ambulation/Gait ?Ambulation/Gait assistance: Min guard ?Gait Distance (Feet): 150 Feet ?Assistive device: None ?Gait Pattern/deviations: Step-through pattern, Decreased stride length, Drifts right/left ?  ?Gait velocity interpretation: 1.31 - 2.62 ft/sec, indicative of limited community ambulator ?  ?General Gait Details: Slow, mildly unsteady gait with mild drifting noted in both directions but no overt LOB. Productive cough. VSS on RA with Sp02 in high 90s. ? ?Stairs ?  ?  ?  ?  ?  ? ?Wheelchair Mobility ?  ? ?Modified Rankin (Stroke Patients Only) ?  ? ?  ? ?Balance Overall balance assessment: Needs assistance ?Sitting-balance support: Feet supported, No upper extremity supported ?Sitting balance-Leahy Scale: Good ?  ?  ?Standing balance support: During functional activity ?Standing balance-Leahy Scale: Fair ?  ?  ?  ?  ?  ?  ?  ?  ?  ?  ?  ?  ?   ? ? ? ?Pertinent Vitals/Pain Pain Assessment ?Pain Assessment: No/denies pain  ? ? ?Home Living Family/patient expects to be discharged to:: Private residence ?Living Arrangements: Spouse/significant other ?Available Help at Discharge: Family;Available PRN/intermittently ?Type of Home: House ?Home Access: Level entry ?  ?  ?Alternate Level Stairs-Number of Steps: 1 flight ?Home Layout: Laundry or work area in basement;Two level ?Home Equipment: Cane - single Barista (2  wheels) ?   ?  ?Prior Function Prior Level of Function : Independent/Modified Independent ?  ?  ?  ?  ?  ?  ?Mobility Comments: Independent, drives, cooks/cleans, no falls at home ?ADLs Comments: independent ?  ? ? ?Hand Dominance  ?  Dominant Hand: Right ? ?  ?Extremity/Trunk Assessment  ? Upper Extremity Assessment ?Upper Extremity Assessment: Defer to OT evaluation ?  ? ?Lower Extremity Assessment ?Lower Extremity Assessment: Generalized weakness (but functional) ?  ? ?Cervical / Trunk Assessment ?Cervical / Trunk Assessment: Kyphotic  ?Communication  ? Communication: No difficulties  ?Cognition Arousal/Alertness: Awake/alert ?Behavior During Therapy: [facility] for tasks assessed/performed ?Overall Cognitive Status: Within Functional Limits for tasks assessed ?  ?  ?  ?  ?  ?  ?  ?  ?  ?  ?  ?  ?  ?  ?  ?  ?  ?  ?  ? ?  ?General Comments General comments (skin integrity, edema, etc.): SP02 remained in high 90s on RA. ? ?  ?Exercises    ? ?Assessment/Plan  ?  ?PT Assessment Patient needs continued PT services  ?PT Problem List Decreased strength;Decreased mobility;Decreased balance;Decreased activity tolerance ? ?   ?  ?PT Treatment Interventions Therapeutic exercise;Gait training;Stair training;Functional mobility training;Therapeutic activities;Patient/family education;Balance training   ? ?PT Goals (Current goals can be found in the Care Plan section)  ?Acute Rehab PT Goals ?Patient Stated Goal: to get better and go home tomorrow ?PT Goal Formulation: With patient ?Time For Goal Achievement: 07/15/21 ?Potential to Achieve Goals: Good ? ?  ?Frequency Min 3X/week ?  ? ? ?Co-evaluation   ?  ?  ?  ?  ? ? ?  ?AM-PAC PT "6 Clicks" Mobility  ?Outcome Measure Help needed turning from your back to your side while in a flat bed without using bedrails?: None ?Help needed moving from lying on your back to sitting on the side of a flat bed without using bedrails?: None ?Help needed moving to and from a bed to a chair (including a wheelchair)?: None ?Help needed standing up from a chair using your arms (e.g., wheelchair or bedside chair)?: None ?Help needed to walk in hospital room?: A Little ?Help needed climbing 3-5 steps with a railing? : A Little ?6 Click  Score: 22 ? ?  ?End of Session Equipment Utilized During Treatment: Gait belt ?Activity Tolerance: Patient tolerated treatment well ?Patient left: in bed;with call bell/phone within reach ?Nurse Communication: Mobility status ?PT Visit Diagnosis: Muscle weakness (generalized) (M62.81);Difficulty in walking, not elsewhere classified (R26.2);Unsteadiness on feet (R26.81) ?  ? ?Time: 3383-2919 ?PT Time Calculation (min) (ACUTE ONLY): 15 min ? ? ?Charges:   PT Evaluation ?$PT Eval Moderate Complexity: 1 Mod ?  ?  ?   ? ? ?Marisa Severin, PT, DPT ?Acute Rehabilitation Services ?Secure chat preferred ?Office [phone] ? ? ? ? ?[place] ?06/03/2021, 9:43 AM ? ?

## 2021-06-03 NOTE — Progress Notes (Addendum)
?Progress Note ? ? ?Patient: [name] RKY:[id number] DOB: Aug 09, 1945 DOA: 06/01/2021     2 ?DOS: the patient was seen and examined on 06/03/2021 ?  ?Brief hospital course: ?[name] was admitted to the hospital with the working diagnosis of COPD exacerbation ? ?76 yo female with the past medical history of stage 3 non small cell lung cancer, (on remission), COPD, hyponatremia, and hypothyroidism, who presented with dyspnea and wheezing. Reported 4 days of wheezing, dyspnea and cough, and poor oral intake. Positive sick contact at home. On her initial physical examination her blood pressure was 100/73, HR 85, RR 18, 02 saturation 100%, dry mucous membranes, heart with S1 and S2 present with no gallops or murmurs, lungs with decreased breath sounds bilaterally, diffuse wheezing, no rales, abdomen soft and not distended, no lower extremity edema.  ? ?Na 118, K 3,8 CL 86, bicarbonate 23, glucose 104, bun 11 cr 0,32 ?BNP 869  ?High sensitive troponin 180  ?Wbc 7,5 hgb 12,7 plt 187 ?Sars covid 19 negative  ? ?Chest radiograph with hyperinflation, scarring on right upper lobe with traction of mediastinum, volume loss.  ? ?CT chest with no pulmonary embolism, stable right lung mass, in the superior segment of the right lower lobe. Stable small right pleural effusion. Right rib fractures 8 and 9 posterior lateral.  ? ?EKG 97 bpm, normal axis, normal intervals, sinus rhythm with no significant ST segment changes, diffuse T wave inversions.  ? ? ? ?Assessment and Plan: ?* COPD with acute exacerbation ([place]) ?Acute hypoxemic and hypercapnic respiratory failure.  ? ?02 saturation is 100% on room air. ?Elevated troponin due to COPD exacerbation.  ? ?On aggressive bronchodilator therapy with duoneb, as needed and scheduled. ?Inhaled corticosteroids ?Airway clearing techniques with flutter valve and incentive spirometer. ?Azithromycin for airway inflammation for 5 days.  ?Out of bed to chair tid with meals, PT and  OT ?Nutrition consultation  ? ?Continue with fonase, affrin and nasal saline for sinus congestion.  ? ? ?Hyponatremia ?Hypovolemic hypo-osmolar hyponatremia ? ?Patient tolerating po well, her renal function today has a serum cr of 0,49 with K of 3,5 and serum bicarbonate of 25. Na 133.  ? ?Unrestricted diet and follow up with nutrition recommendations.  ? ? ?Coronary artery disease ?Echocardiogram with preserved LV systolic function with EF 55 to 60% with moderate hypokinesis of the left ventricle apical segment, inferior segment and anterior segment. Preserved RV systolic function with RVSP 32.6 mmHg.  ?No significant valvular disease.  ? ?EKG with no ischemic changes. ?High sensitive troponin 180 and 171.  ?Patient chest pain free.  ? ?Plan considering new wall motion abnormalities on the left ventricle patient may need ischemic workup.  ?Will consult cardiology in am.  ? ? ? ?Hypothyroidism ?Continue with levothyroxine  ? ?Lung cancer ([place]) ?Currently on remission sp radiation and chemotherapy.  ? ?Protein-calorie malnutrition, moderate ([place]) ?Continue with nutritional supplements ?Follow up with nutrition recommendations.  ? ? ? ? ?  ? ?Subjective: Patient with no chest pain, dyspnea has improve, but not back to baseline  ? ?Physical Exam: ?Vitals:  ? 06/02/21 2016 06/03/21 3151 06/03/21 0419 06/03/21 0841  ?BP:   110/60   ?Pulse: 99 85    ?Resp: 18 17    ?Temp:   98 ?F (36.7 ?C)   ?TempSrc:   Oral   ?SpO2: 98% 97% 98% 100%  ?Weight:   42.2 kg   ?Height:      ? ?Neurology awake and alert ?ENT with mild  pallor ?Cardiovascular with S1 and S2 present and rhythmic, no gallops or murmurs,. No rubs ?Respiratory with prolonged expiratory phase, scattered bilateral rhonchi and anterior expiratory wheezing ?Abdomen not distended ?No lower extremity edema.  ?Data Reviewed: ? ? ? ?Family Communication: no family at the bedside  ? ?Disposition: ?Status is: Inpatient ?Remains inpatient appropriate because: respiratory  failure  ? Planned Discharge Destination: Home ? ? ?Author: ?Tawni Millers, MD ?06/03/2021 9:35 AM ? ?For on call review [email or web address]  ?

## 2021-06-04 ENCOUNTER — Other Ambulatory Visit (HOSPITAL_COMMUNITY): Payer: Self-pay

## 2021-06-04 ENCOUNTER — Encounter (HOSPITAL_COMMUNITY): Payer: Self-pay | Admitting: Internal Medicine

## 2021-06-04 DIAGNOSIS — R9431 Abnormal electrocardiogram [ECG] [EKG]: Secondary | ICD-10-CM

## 2021-06-04 DIAGNOSIS — R778 Other specified abnormalities of plasma proteins: Secondary | ICD-10-CM

## 2021-06-04 DIAGNOSIS — E871 Hypo-osmolality and hyponatremia: Secondary | ICD-10-CM | POA: Diagnosis not present

## 2021-06-04 DIAGNOSIS — J441 Chronic obstructive pulmonary disease with (acute) exacerbation: Secondary | ICD-10-CM | POA: Diagnosis not present

## 2021-06-04 DIAGNOSIS — I2583 Coronary atherosclerosis due to lipid rich plaque: Secondary | ICD-10-CM

## 2021-06-04 DIAGNOSIS — I5181 Takotsubo syndrome: Secondary | ICD-10-CM

## 2021-06-04 DIAGNOSIS — C3431 Malignant neoplasm of lower lobe, right bronchus or lung: Secondary | ICD-10-CM | POA: Diagnosis not present

## 2021-06-04 DIAGNOSIS — I2510 Atherosclerotic heart disease of native coronary artery without angina pectoris: Secondary | ICD-10-CM | POA: Diagnosis not present

## 2021-06-04 DIAGNOSIS — I700 Atherosclerosis of aorta: Secondary | ICD-10-CM

## 2021-06-04 DIAGNOSIS — E440 Moderate protein-calorie malnutrition: Secondary | ICD-10-CM | POA: Diagnosis not present

## 2021-06-04 LAB — BASIC METABOLIC PANEL
Anion gap: 6 (ref 5–15)
BUN: 11 mg/dL (ref 8–23)
CO2: 27 mmol/L (ref 22–32)
Calcium: 9.1 mg/dL (ref 8.9–10.3)
Chloride: 100 mmol/L (ref 98–111)
Creatinine, Ser: 0.51 mg/dL (ref 0.44–1.00)
GFR, Estimated: >60 mL/min (ref >60)
Glucose, Bld: 118 mg/dL — ABNORMAL HIGH (ref 70–99)
Potassium: 4.2 mmol/L (ref 3.5–5.1)
Sodium: 133 mmol/L — ABNORMAL LOW (ref 135–145)

## 2021-06-04 MED ORDER — ASPIRIN EC 81 MG PO TBEC
81.0000 mg | DELAYED_RELEASE_TABLET | Freq: Every day | ORAL | Status: DC
  Administered 2021-06-04 – 2021-06-05 (×2): 81 mg via ORAL
  Filled 2021-06-04 (×2): qty 1

## 2021-06-04 MED ORDER — IPRATROPIUM-ALBUTEROL 0.5-2.5 (3) MG/3ML IN SOLN: 3.0000 mL | Freq: Two times a day (BID) | RESPIRATORY_TRACT | Status: DC

## 2021-06-04 MED ORDER — METHYLPREDNISOLONE SODIUM SUCC 40 MG IJ SOLR: 20.0000 mg | Freq: Every day | INTRAMUSCULAR | Status: DC

## 2021-06-04 MED ORDER — ATORVASTATIN CALCIUM 10 MG PO TABS
20.0000 mg | ORAL_TABLET | Freq: Every day | ORAL | Status: DC
  Administered 2021-06-05: 20 mg via ORAL
  Filled 2021-06-04 (×2): qty 2

## 2021-06-04 MED ORDER — PREDNISONE 20 MG PO TABS
20.0000 mg | ORAL_TABLET | Freq: Every day | ORAL | 0 refills | Status: AC
Start: 2021-06-05 — End: 2021-06-08
  Filled 2021-06-04: qty 3, 3d supply, fill #0

## 2021-06-04 MED ORDER — PREDNISONE 20 MG PO TABS
20.0000 mg | ORAL_TABLET | Freq: Every day | ORAL | Status: DC
  Administered 2021-06-05: 20 mg via ORAL
  Filled 2021-06-04: qty 1

## 2021-06-04 MED ORDER — ENSURE ENLIVE PO LIQD
237.0000 mL | Freq: Two times a day (BID) | ORAL | 0 refills | Status: DC
  Filled 2021-06-04: qty 14220, 30d supply, fill #0

## 2021-06-04 NOTE — Progress Notes (Addendum)
?Progress Note ? ? ?Patient: [name] GMW:[id number] DOB: 1945-07-16 DOA: 06/01/2021     3 ?DOS: the patient was seen and examined on 06/04/2021 ?  ?Brief hospital course: ?Mrs. Sealy was admitted to the hospital with the working diagnosis of COPD exacerbation ? ?76 yo female with the past medical history of stage 3 non small cell lung cancer, (on remission), COPD, hyponatremia, and hypothyroidism, who presented with dyspnea and wheezing. Reported 4 days of wheezing, dyspnea and cough, and poor oral intake. Positive sick contact at home. On her initial physical examination her blood pressure was 100/73, HR 85, RR 18, 02 saturation 100%, dry mucous membranes, heart with S1 and S2 present with no gallops or murmurs, lungs with decreased breath sounds bilaterally, diffuse wheezing, no rales, abdomen soft and not distended, no lower extremity edema.  ? ?Na 118, K 3,8 CL 86, bicarbonate 23, glucose 104, bun 11 cr 0,32 ?BNP 869  ?High sensitive troponin 180  ?Wbc 7,5 hgb 12,7 plt 187 ?Sars covid 19 negative  ? ?Chest radiograph with hyperinflation, scarring on right upper lobe with traction of mediastinum, volume loss.  ? ?CT chest with no pulmonary embolism, stable right lung mass, in the superior segment of the right lower lobe. Stable small right pleural effusion. Right rib fractures 8 and 9 posterior lateral.  ? ?EKG 97 bpm, normal axis, normal intervals, sinus rhythm with no significant ST segment changes, diffuse T wave inversions.  ? ?Patient was placed on bronchodilator therapy, inhaled and systemic corticosteroids with improvement of her symptoms.  ?Echocardiogram with LV preserved systolic function but with new wall motion abnormalities.  ?Consulted cardiology  ? ?Assessment and Plan: ?* COPD with acute exacerbation ([place]) ?Acute hypoxemic and hypercapnic respiratory failure.  ? ?02 saturation is 100% on room air. ?Elevated troponin due to COPD exacerbation.  ? ?Continue with aggressive bronchodilator  therapy with duoneb, as needed and scheduled. ?Inhaled corticosteroids ?Decrease systemic steroids to 20 mg daily.  ?Airway clearing techniques with flutter valve and incentive spirometer. ?Azithromycin for airway inflammation for 5 days.  ?Out of bed to chair tid with meals, PT and OT ?Nutrition consultation  ? ?Continue with fonase and nasal saline for sinus congestion.  ? ? ?Hyponatremia ?Hypovolemic hypo-osmolar hyponatremia ?Likely component of SIADH ? ?Stable renal function with serum cr at 0,51, with K at 4.2 and serum bicarbonate at 27, Na is 133.  ? ?Unrestricted diet and follow up with nutrition recommendations.  ?Fluid restriction 1500 ml per day. ? ? ?Coronary artery disease ?Suspected CAD.  ? ?Echocardiogram with preserved LV systolic function with EF 55 to 60% with moderate hypokinesis of the left ventricle apical segment, inferior segment and anterior segment. Preserved RV systolic function with RVSP 32.6 mmHg.  ?No significant valvular disease.  ? ?EKG with no ischemic changes. ?High sensitive troponin 180 and 171.  ?Patient chest pain free.  ? ?Considering new wall motion abnormalities on the left ventricle patient may need ischemic workup.  ?Add aspirin and statin  ?Consulted cardiology.  ? ? ? ? ?Hypothyroidism ?Continue with levothyroxine  ? ?Lung cancer ([place]) ?Currently on remission sp radiation and chemotherapy.  ? ?Protein-calorie malnutrition, moderate ([place]) ?Continue with nutritional supplements ?Follow up with nutrition recommendations.  ? ? ? ? ?  ? ?Subjective: Patient with improvement in dyspnea, no chest pain, no edema  ? ?Physical Exam: ?Vitals:  ? 06/03/21 2033 06/04/21 4403 06/04/21 4742 06/04/21 0846  ?BP:  100/63 130/68   ?Pulse: 98 84    ?Resp:  16 16    ?Temp:  98.3 ?F (36.8 ?C)    ?TempSrc:  Oral    ?SpO2: 96% 95% 98% 100%  ?Weight:  42.5 kg    ?Height:      ? ?Neurology awake and alert ?ENT with mild pallor ?Cardiovascular with S1 and S2 present and rhythmic with no gallops,  rubs or murmurs ?No JVD ?No lower extremity edema ?Respiratory with no rales or wheezing, scattered rhonchi anteriorly ?Abdomen not distended  ?Data Reviewed: ? ? ? ?Family Communication: no family at the bedside  ? ?Disposition: ?Status is: Inpatient ?Remains inpatient appropriate because: COPD ? Planned Discharge Destination: Home ? ? ? ?Author: ?Tawni Millers, MD ?06/04/2021 12:35 PM ? ?For on call review [email or web address]  ?

## 2021-06-04 NOTE — Progress Notes (Signed)
Occupational Therapy Treatment ?Patient Details ?Name: [name] ?MRN: [id number] ?DOB: [birth year] ?Today's Date: 06/04/2021 ? ? ?History of present illness [name] is a 76 yo female admitted 06/01/21 with wheezing, dyspnea, cough, and poor oral intake. Admitted with hyponatremia, hypovolemia and COPD exacerbation. Positive sick contact at home. CT angiogram negative for PE. PMH includes stage 3 non small cell lung cancer, (on remission), COPD. ?  ?OT comments ? Patient continues to make steady progress towards goals in skilled OT session. Patient's session encompassed  education with regard to energy conservation handout. Husband and son also present for education. Patient receptive to education, especially with regard to taking breaks in between tasks to avoid increased fatigue. No questions after education completed with handout left to reference. OT will continue to follow acutely.   ? ?Recommendations for follow up therapy are one component of a multi-disciplinary discharge planning process, led by the attending physician.  Recommendations may be updated based on patient status, additional functional criteria and insurance authorization. ?   ?Follow Up Recommendations ? No OT follow up  ?  ?Assistance Recommended at Discharge PRN  ?Patient can return home with the following ? Assistance with cooking/housework;A little help with bathing/dressing/bathroom;Help with stairs or ramp for entrance ?  ?Equipment Recommendations ? BSC/3in1  ?  ?Recommendations for Other Services   ? ?  ?Precautions / Restrictions Precautions ?Precautions: Fall ?Restrictions ?Weight Bearing Restrictions: No  ? ? ?  ? ?Mobility Bed Mobility ?  ?  ?  ?  ?  ?  ?  ?General bed mobility comments: electing to stay in bed this session, mobilized previously with mobility specialist ?  ? ?Transfers ?  ?  ?  ?  ?  ?  ?  ?  ?  ?  ?  ?  ?Balance   ?  ?  ?  ?  ?  ?  ?  ?  ?  ?  ?  ?  ?  ?  ?  ?  ?  ?  ?   ? ?ADL either performed or  assessed with clinical judgement  ? ?ADL Overall ADL's : Needs assistance/impaired ?  ?  ?  ?  ?  ?  ?  ?  ?  ?  ?  ?  ?  ?  ?  ?  ?  ?  ?  ?General ADL Comments: Session focus on energy conservation handout and education ?  ? ?Extremity/Trunk Assessment   ?  ?  ?  ?  ?  ? ?Vision   ?  ?  ?Perception   ?  ?Praxis   ?  ? ?Cognition Arousal/Alertness: Awake/alert ?Behavior During Therapy: [employer] for tasks assessed/performed ?Overall Cognitive Status: Within Functional Limits for tasks assessed ?  ?  ?  ?  ?  ?  ?  ?  ?  ?  ?  ?  ?  ?  ?  ?  ?  ?  ?  ?   ?Exercises   ? ?  ?Shoulder Instructions   ? ? ?  ?General Comments    ? ? ?Pertinent Vitals/ Pain       Pain Assessment ?Pain Assessment: No/denies pain ? ?Home Living   ?  ?  ?  ?  ?  ?  ?  ?  ?  ?  ?  ?  ?  ?  ?  ?  ?  ?  ? ?  ?Prior Functioning/Environment    ?  ?  ?  ?   ? ?  Frequency ?    ? ? ? ? ?  ?Progress Toward Goals ? ?OT Goals(current goals can now be found in the care plan section) ? Progress towards OT goals: Progressing toward goals ? ?Acute Rehab OT Goals ?Patient Stated Goal: to feel better ?OT Goal Formulation: With patient ?Time For Goal Achievement: 06/17/21 ?Potential to Achieve Goals: Good  ?Plan Discharge plan remains appropriate   ? ?Co-evaluation ? ? ?   ?  ?  ?  ?  ? ?  ?AM-PAC OT "6 Clicks" Daily Activity     ?Outcome Measure ? ? Help from another person eating meals?: None ?Help from another person taking care of personal grooming?: A Little ?Help from another person toileting, which includes using toliet, bedpan, or urinal?: A Little ?Help from another person bathing (including washing, rinsing, drying)?: A Little ?Help from another person to put on and taking off regular upper body clothing?: None ?Help from another person to put on and taking off regular lower body clothing?: A Little ?6 Click Score: 20 ? ?  ?End of Session Equipment Utilized During Treatment: Other (comment) (EC handout) ? ?OT Visit Diagnosis: Muscle weakness (generalized)  (M62.81);Other (comment) (cardiopulmonary status limiting activity) ?  ?Activity Tolerance Patient tolerated treatment well ?  ?Patient Left in bed;with call bell/phone within reach;with family/visitor present ?  ?Nurse Communication Mobility status ?  ? ?   ? ?Time: 2902-1115 ?OT Time Calculation (min): 12 min ? ?Charges: OT General Charges ?$OT Visit: 1 Visit ?OT Treatments ?$Self Care/Home Management : 8-22 mins ? ?[name], OTR/L ?Acute Rehabilitation Services ?[phone] ?[phone]  ? ?[name] ?06/04/2021, 2:42 PM ?

## 2021-06-04 NOTE — Progress Notes (Signed)
Mobility Specialist Progress Note: ? ? 06/04/21 1230  ?Mobility  ?Activity Ambulated with assistance in hallway  ?Level of Assistance Contact guard assist, steadying assist  ?Assistive Device None  ?Distance Ambulated (ft) 200 ft  ?Activity Response Tolerated well  ?$Mobility charge 1 Mobility  ? ?Pt eager for mobility session, states she did not get much sleep. Required CGA for steadying. Pt back in bed with all needs met, eating lunch.  ? ?Nelta Numbers ?Acute Rehab ?Phone: [phone] ?Office Phone: [phone] ? ?

## 2021-06-04 NOTE — Progress Notes (Signed)
Physical Therapy Treatment ?Patient Details ?Name: [name] ?MRN: [id number] ?DOB: 21-Mar-1945 ?Today's Date: 06/04/2021 ? ? ?History of Present Illness [name] is a 76 yo female admitted 06/01/21 with wheezing, dyspnea, cough, and poor oral intake. Admitted with hyponatremia, hypovolemia and COPD exacerbation. Positive sick contact at home. CT angiogram negative for PE. PMH includes stage 3 non small cell lung cancer, (on remission), COPD. ? ?  ?PT Comments  ? ? The pt was seen for continued progression of mobility and stair training prior to anticipated d/c home. She was able to demo good stability and endurance with hallway ambulation without need for UE support, and completed x2 stairs without physical assist. She is eager for return home, discussed continued progressive walking program with the pt and her son who was present during the session. All questions answered, pt is safe to return home with her family for assist as needed.  ?   ?Recommendations for follow up therapy are one component of a multi-disciplinary discharge planning process, led by the attending physician.  Recommendations may be updated based on patient status, additional functional criteria and insurance authorization. ? ?Follow Up Recommendations ? No PT follow up ?  ?  ?Assistance Recommended at Discharge PRN  ?Patient can return home with the following Help with stairs or ramp for entrance;Assistance with cooking/housework;Assist for transportation ?  ?Equipment Recommendations ? None recommended by PT  ?  ?Recommendations for Other Services   ? ? ?  ?Precautions / Restrictions Precautions ?Precautions: Fall ?Restrictions ?Weight Bearing Restrictions: No  ?  ? ?Mobility ? Bed Mobility ?Overal bed mobility: Modified Independent ?  ?  ?  ?  ?  ?  ?General bed mobility comments: HOB elevated, but pt able to move in bed without assist ?  ? ?Transfers ?Overall transfer level: Modified independent ?Equipment used: None ?  ?  ?  ?  ?   ?  ?  ?General transfer comment: stable with all transitions ?  ? ?Ambulation/Gait ?Ambulation/Gait assistance: Min guard ?Gait Distance (Feet): 300 Feet ?Assistive device: None ?Gait Pattern/deviations: Step-through pattern, Decreased stride length, Drifts right/left, Narrow base of support ?  ?Gait velocity interpretation: 1.31 - 2.62 ft/sec, indicative of limited community ambulator ?  ?General Gait Details: pt with x3 narrow, scissoring steps, no assist to correct balance. functional speed ? ? ?Stairs ?Stairs: Yes ?Stairs assistance: Min guard ?Stair Management: One rail Left, Alternating pattern ?Number of Stairs: 2 (x2) ?General stair comments: pt completed x2 with small LOB initially and without issue on second attempt. HR to 124 bpm after ? ? ?  ?Balance Overall balance assessment: Needs assistance ?Sitting-balance support: No upper extremity supported, Feet supported ?Sitting balance-Leahy Scale: Good ?  ?  ?Standing balance support: No upper extremity supported, During functional activity ?Standing balance-Leahy Scale: Fair ?Standing balance comment: minor LOB with gait, no assist needed to correct ?  ?  ?  ?  ?  ?  ?  ?  ?  ?  ?  ?  ? ?  ?Cognition Arousal/Alertness: Awake/alert ?Behavior During Therapy: [facility] for tasks assessed/performed ?Overall Cognitive Status: Within Functional Limits for tasks assessed ?  ?  ?  ?  ?  ?  ?  ?  ?  ?  ?  ?  ?  ?  ?  ?  ?  ?  ?  ? ?  ?Exercises   ? ?  ?General Comments General comments (skin integrity, edema, etc.): VSS on RA, HR to  124bpm with stairs ?  ?  ? ?Pertinent Vitals/Pain Pain Assessment ?Pain Assessment: No/denies pain  ? ? ? ?PT Goals (current goals can now be found in the care plan section) Acute Rehab PT Goals ?Patient Stated Goal: to get better and go home tomorrow ?PT Goal Formulation: With patient ?Time For Goal Achievement: 06/14/21 ?Potential to Achieve Goals: Good ?Progress towards PT goals: Progressing toward goals ? ?  ?Frequency ? ? ? Min  3X/week ? ? ? ?  ?PT Plan Current plan remains appropriate  ? ? ?   ?AM-PAC PT "6 Clicks" Mobility   ?Outcome Measure ? Help needed turning from your back to your side while in a flat bed without using bedrails?: None ?Help needed moving from lying on your back to sitting on the side of a flat bed without using bedrails?: None ?Help needed moving to and from a bed to a chair (including a wheelchair)?: None ?Help needed standing up from a chair using your arms (e.g., wheelchair or bedside chair)?: None ?Help needed to walk in hospital room?: A Little ?Help needed climbing 3-5 steps with a railing? : A Little ?6 Click Score: 22 ? ?  ?End of Session Equipment Utilized During Treatment: Gait belt ?Activity Tolerance: Patient tolerated treatment well ?Patient left: in bed;with call bell/phone within reach;with family/visitor present ?Nurse Communication: Mobility status ?PT Visit Diagnosis: Muscle weakness (generalized) (M62.81);Difficulty in walking, not elsewhere classified (R26.2);Unsteadiness on feet (R26.81) ?  ? ? ?Time: 5929-2446 ?PT Time Calculation (min) (ACUTE ONLY): 17 min ? ?Charges:  $Therapeutic Exercise: 8-22 mins          ?          ? ?West Carbo, PT, DPT  ? ?Acute Rehabilitation Department ?Pager #: [phone] ? ? ? ?Sandra Cockayne ?06/04/2021, 5:26 PM ? ?

## 2021-06-04 NOTE — Consult Note (Signed)
?Cardiology Consultation:  ? ?Patient ID: [name] ?MRN: [id number]; DOB: 20-May-1945 ? ?Admit date: 06/01/2021 ?Date of Consult: 06/04/2021 ? ?PCP:  Lawerance Cruel, MD ?  ?[facility] Providers ?Cardiologist:  Buford Dresser, MD    ? ?Patient Profile:  ? ?[name] is a 76 y.o. female with a hx of stage III non-small cell lung CA s/p chemo/radiation, COPD, hyponatremia, hypothyroidism and remote tobacco use who is being seen 06/04/2021 for the evaluation of elevated troponin at the request of Dr. Cathlean Sauer.  ? ?History of Present Illness:  ? ?[name] is a 76 yo female with PMH noted above. She has never been evaluated by cardiology in the past. Reports father having some kind of heart disease and CHF.  ? ?Presented to the ED on 4/21 with complaints of worsening dyspnea and wheezing for about 4 days prior to admission. Had a sick contact at home. Also with decreased oral intake and weakness.  ? ?Labs in the ED showed sodium 118, potassium 3.8, creatinine 0.32, BNP 869, high-sensitivity troponin 180>> 171, WBC 7.5, hemoglobin 12.7, TSH 0.685.  EKG showed sinus rhythm, 97 bpm, PACs, LVH, T wave inversion in inferoseptal and lateral leads.  Chest x-ray showed known right-sided primary bronchogenic carcinoma.  CT angio chest negative for PE, small right pleural effusion stable subacute 8/9 rib fractures.  She was admitted to internal medicine for acute COPD exacerbation and started on steroids and neb treatments.  Given her abnormal EKG she underwent echocardiogram 4/22 which showed LVEF of 55 to 79%, 1 diastolic dysfunction, moderate hypokinesis of the LV, apical inferior segments suggestive of takatsubo cardiomyopathy.   ? ?In talking with patient she has not had any chest pain prior to or during admission.  ? ?Past Medical History:  ?Diagnosis Date  ? Dyspnea   ? Glaucoma   ? Headache   ? Hypothyroidism   ? Right lower lobe lung mass   ? Thyroid condition   ? ? ?Past Surgical History:   ?Procedure Laterality Date  ? BIOPSY  05/11/2019  ? Procedure: BIOPSY;  Surgeon: Laurin Coder, MD;  Location: [facility];  Service: Pulmonary;;  ? BRONCHIAL WASHINGS  05/11/2019  ? Procedure: BRONCHIAL WASHINGS;  Surgeon: Laurin Coder, MD;  Location: [facility];  Service: Pulmonary;;  ? CATARACT EXTRACTION, BILATERAL    ? 1 was a lens implant  ? COLONOSCOPY    ? FINE NEEDLE ASPIRATION  05/11/2019  ? Procedure: FINE NEEDLE ASPIRATION (FNA) LINEAR;  Surgeon: Laurin Coder, MD;  Location: [place] [facility];  Service: Pulmonary;;  ? IR IMAGING GUIDED PORT INSERTION  05/27/2019  ? MEDIASTINOSCOPY N/A 05/21/2019  ? Procedure: MEDIASTINOSCOPY;  Surgeon: Grace Isaac, MD;  Location: [facility];  Service: Thoracic;  Laterality: N/A;  ? SHOULDER SURGERY    ? VIDEO BRONCHOSCOPY WITH ENDOBRONCHIAL ULTRASOUND N/A 05/11/2019  ? Procedure: VIDEO BRONCHOSCOPY WITH ENDOBRONCHIAL ULTRASOUND;  Surgeon: Laurin Coder, MD;  Location: [place] [facility];  Service: Pulmonary;  Laterality: N/A;  ? VIDEO BRONCHOSCOPY WITH ENDOBRONCHIAL ULTRASOUND N/A 05/21/2019  ? Procedure: VIDEO BRONCHOSCOPY WITH ENDOBRONCHIAL ULTRASOUND;  Surgeon: Grace Isaac, MD;  Location: [facility];  Service: Thoracic;  Laterality: N/A;  ? vocal cord polyp removal    ? WISDOM TOOTH EXTRACTION    ?  ? ?Home Medications:  ?Prior to Admission medications   ?Medication Sig Start Date End Date Taking? Authorizing Provider  ?albuterol (VENTOLIN HFA) 108 (90 Base) MCG/ACT inhaler  11/17/20  Yes [provider]  ?  amoxicillin (AMOXIL) 500 MG capsule Take 4 capsules (2,000 mg total) by mouth once as needed for up to 1 dose. Take one hour before dental appointment 02/28/21  Yes [name], MD  ?Cholecalciferol (VITAMIN D) 50 MCG (2000 UT) tablet Take 2,000 Units by mouth daily with supper.   Yes [provider]  ?dorzolamide (TRUSOPT) 2 % ophthalmic solution Place 1 drop into both eyes 2 (two) times daily.    Yes [provider]   ?irbesartan (AVAPRO) 150 MG tablet Take 150 mg by mouth daily. 11/16/20  Yes [provider]  ?levothyroxine (SYNTHROID) 88 MCG tablet Take 88 mcg by mouth every morning. 11/01/20  Yes [provider]  ?lidocaine (LIDODERM) 5 % Place 1 patch onto the skin daily. Remove & Discard patch within 12 hours or as directed by MD ?Patient taking differently: Place 1 patch onto the skin daily as needed (For pain). 01/26/20  Yes [name], MD  ?lidocaine-prilocaine (EMLA) cream Apply to affected area once ?Patient taking differently: Apply 1 application. topically daily as needed (For port access). 09/29/19  Yes [name], MD  ?polyethylene glycol powder (GLYCOLAX/MIRALAX) 17 GM/SCOOP powder Take 0.5 Containers by mouth daily as needed for mild constipation.   Yes [provider]  ?SPIRIVA RESPIMAT 2.5 MCG/ACT AERS Inhale 1 puff into the lungs daily. 11/21/20  Yes [provider]  ?travoprost, benzalkonium, (TRAVATAN) 0.004 % ophthalmic solution Place 1 drop into both eyes at bedtime.   Yes [provider]  ?Wheat Dextrin (BENEFIBER) POWD Take by mouth daily. 3 scoops into water daily per patient   Yes [provider]  ?COVID-19 mRNA bivalent vaccine, Pfizer, (PFIZER COVID-19 VAC BIVALENT) injection Inject into the muscle. 12/19/20   Carlyle Basques, MD  ?diphenhydrAMINE (BENADRYL) 25 MG tablet Take 50 mg by mouth daily as needed (allergic reaction). ?Patient not taking: Reported on 04/06/2020    [provider]  ?durvalumab (IMFINZI) 120 MG/2.4ML SOLN injection Inject into the vein. ?Patient not taking: Reported on 12/20/2020    [provider]  ? ? ?Inpatient Medications: ?Scheduled Meds: ? aspirin EC  81 mg Oral Daily  ? atorvastatin  20 mg Oral Daily  ? azithromycin  500 mg Oral Daily  ? benzonatate  100 mg Oral TID  ? Chlorhexidine Gluconate Cloth  6 each Topical Daily  ? dorzolamide  1 drop Both Eyes BID  ? enoxaparin (LOVENOX) injection  30  mg Subcutaneous Q24H  ? feeding supplement  237 mL Oral BID BM  ? fiber  1 packet Oral Daily  ? fluticasone  2 spray Each Nare BID  ? [START ON 06/05/2021] ipratropium-albuterol  3 mL Nebulization BID  ? irbesartan  150 mg Oral Daily  ? levothyroxine  88 mcg Oral QAC breakfast  ? LORazepam  0.5 mg Oral QHS  ? mometasone-formoterol  2 puff Inhalation BID  ? pantoprazole  40 mg Oral q morning  ? [START ON 06/05/2021] predniSONE  20 mg Oral Q breakfast  ? Travoprost (BAK Free)  1 drop Both Eyes QHS  ? ?Continuous Infusions: ? ?PRN Meds: ?albuterol, guaiFENesin-dextromethorphan, melatonin, sodium chloride ? ?Allergies:    ?Allergies  ?Allergen Reactions  ? Bee Venom Swelling and Other (See Comments)  ? Timolol Nausea Only  ? Brimonidine Tartrate Other (See Comments)  ? Codeine Other (See Comments)  ? Other Other (See Comments)  ? Brimonidine Nausea Only  ? Hydrocodone Itching, Rash and Other (See Comments)  ? ? ?Social History:   ?  Social History  ? ?Socioeconomic History  ? Marital status: Married  ?  Spouse name: Not on file  ? Number of children: Not on file  ? Years of education: Not on file  ? Highest education level: Not on file  ?Occupational History  ? Not on file  ?Tobacco Use  ? Smoking status: Former  ?  Types: Cigarettes  ?  Quit date: 02/13/2010  ?  Years since quitting: 11.3  ? Smokeless tobacco: Never  ? Tobacco comments:  ?  Quit in 2011  ?Vaping Use  ? Vaping Use: Former  ? Devices: used 2-3 weeks while she was quitting cigarettes  ?Substance and Sexual Activity  ? Alcohol use: Yes  ?  Comment: very rare, 1 glass of wine maybe every 3-4 weeks  ? Drug use: No  ? Sexual activity: Not on file  ?Other Topics Concern  ? Not on file  ?Social History Narrative  ? Not on file  ? ?Social Determinants of Health  ? ?Financial Resource Strain: Not on file  ?Food Insecurity: Not on file  ?Transportation Needs: Not on file  ?Physical Activity: Not on file  ?Stress: Not on file  ?Social Connections: Not on file  ?Intimate  Partner Violence: Not on file  ?  ?Family History:   ? ?Family History  ?Problem Relation Age of Onset  ? Heart failure Father   ? CAD Father   ?  ? ?ROS:  ?Please see the history of present illness.  ? ?All other

## 2021-06-04 NOTE — Plan of Care (Signed)
?  Problem: Education: ?Goal: Knowledge of disease or condition will improve ?Outcome: Progressing ?Goal: Knowledge of the prescribed therapeutic regimen will improve ?Outcome: Progressing ?  ?Problem: Activity: ?Goal: Ability to tolerate increased activity will improve ?Outcome: Progressing ?  ?Problem: Respiratory: ?Goal: Ability to maintain a clear airway will improve ?Outcome: Progressing ?Goal: Levels of oxygenation will improve ?Outcome: Progressing ?Goal: Ability to maintain adequate ventilation will improve ?Outcome: Progressing ?  ?Problem: Clinical Measurements: ?Goal: Diagnostic test results will improve ?Outcome: Progressing ?Goal: Respiratory complications will improve ?Outcome: Progressing ?Goal: Cardiovascular complication will be avoided ?Outcome: Progressing ?  ?Problem: Activity: ?Goal: Risk for activity intolerance will decrease ?Outcome: Progressing ?  ?Problem: Elimination: ?Goal: Will not experience complications related to urinary retention ?Outcome: Progressing ?  ?Problem: Pain Managment: ?Goal: General experience of comfort will improve ?Outcome: Progressing ?  ?Problem: Safety: ?Goal: Ability to remain free from injury will improve ?Outcome: Progressing ?  ?

## 2021-06-05 ENCOUNTER — Other Ambulatory Visit (HOSPITAL_COMMUNITY): Payer: Self-pay

## 2021-06-05 ENCOUNTER — Encounter: Payer: Self-pay | Admitting: Hematology

## 2021-06-05 ENCOUNTER — Encounter: Payer: Self-pay | Admitting: Hematology & Oncology

## 2021-06-05 DIAGNOSIS — E871 Hypo-osmolality and hyponatremia: Secondary | ICD-10-CM | POA: Diagnosis not present

## 2021-06-05 DIAGNOSIS — E440 Moderate protein-calorie malnutrition: Secondary | ICD-10-CM | POA: Diagnosis not present

## 2021-06-05 DIAGNOSIS — J441 Chronic obstructive pulmonary disease with (acute) exacerbation: Secondary | ICD-10-CM | POA: Diagnosis not present

## 2021-06-05 DIAGNOSIS — I2510 Atherosclerotic heart disease of native coronary artery without angina pectoris: Secondary | ICD-10-CM | POA: Diagnosis not present

## 2021-06-05 LAB — LEGIONELLA PNEUMOPHILA SEROGP 1 UR AG: L. pneumophila Serogp 1 Ur Ag: NEGATIVE

## 2021-06-05 LAB — LIPID PANEL
Cholesterol: 121 mg/dL (ref 0–200)
HDL: 61 mg/dL (ref >40)
LDL Cholesterol: 53 mg/dL (ref 0–99)
Total CHOL/HDL Ratio: 2 RATIO
Triglycerides: 37 mg/dL (ref <150)
VLDL: 7 mg/dL (ref 0–40)

## 2021-06-05 LAB — MYCOPLASMA PNEUMONIAE ANTIBODY, IGM: Mycoplasma pneumo IgM: <770 U/mL (ref 0–769)

## 2021-06-05 MED ORDER — ATORVASTATIN CALCIUM 20 MG PO TABS
20.0000 mg | ORAL_TABLET | Freq: Every day | ORAL | 0 refills | Status: DC
Start: 2021-06-06 — End: 2021-07-05
  Filled 2021-06-05: qty 30, 30d supply, fill #0

## 2021-06-05 MED ORDER — HEPARIN SOD (PORK) LOCK FLUSH 100 UNIT/ML IV SOLN
500.0000 [IU] | INTRAVENOUS | Status: AC | PRN
  Administered 2021-06-05: 500 [IU]

## 2021-06-05 MED ORDER — ASPIRIN 81 MG PO TBEC
81.0000 mg | DELAYED_RELEASE_TABLET | Freq: Every day | ORAL | 0 refills | Status: DC
  Filled 2021-06-05: qty 30, 30d supply, fill #0

## 2021-06-05 NOTE — Progress Notes (Signed)
Physical Therapy Treatment ?Patient Details ?Name: [name] ?MRN: [id number] ?DOB: [birth year] ?Today's Date: 06/05/2021 ? ? ?History of Present Illness [name] is a 76 yo female admitted 06/01/21 with wheezing, dyspnea, cough, and poor oral intake. Admitted with hyponatremia, hypovolemia and COPD exacerbation. Positive sick contact at home. CT angiogram negative for PE. PMH includes stage 3 non small cell lung cancer, (on remission), COPD. ? ?  ?PT Comments  ? ? Patient progressing well towards PT goals. Reports feeling better today and eager to return home. Session focused on gait training, higher level balance and stair training. Requires supervision at times for safety with gait due to mild balance deficits with higher lever balance challenges but no overt LOB noted. Noted to have 2-3/4 DOE with exertion needing 1 seated rest break post stairs. Pt reports feeling about 50% of baseline. Scored 18/24 on DGI indicating small fall risk. VSS on RA throughout. Will follow acutely for balance and overall safe mobility. ?    ?Recommendations for follow up therapy are one component of a multi-disciplinary discharge planning process, led by the attending physician.  Recommendations may be updated based on patient status, additional functional criteria and insurance authorization. ? ?Follow Up Recommendations ? No PT follow up ?  ?  ?Assistance Recommended at Discharge PRN  ?Patient can return home with the following Help with stairs or ramp for entrance;Assistance with cooking/housework;Assist for transportation ?  ?Equipment Recommendations ? None recommended by PT  ?  ?Recommendations for Other Services   ? ? ?  ?Precautions / Restrictions Precautions ?Precautions: Fall ?Restrictions ?Weight Bearing Restrictions: No  ?  ? ?Mobility ? Bed Mobility ?Overal bed mobility: Modified Independent ?  ?  ?  ?  ?  ?  ?General bed mobility comments: HOB elevated, but pt able to move in bed without assist ?   ? ?Transfers ?Overall transfer level: Needs assistance ?Equipment used: None ?Transfers: Sit to/from Stand ?Sit to Stand: Supervision ?  ?  ?  ?  ?  ?General transfer comment: Supervision for safety. Stood from [employer]. ?  ? ?Ambulation/Gait ?Ambulation/Gait assistance: Supervision ?Gait Distance (Feet): 200 Feet (+ 150') ?Assistive device: None ?Gait Pattern/deviations: Step-through pattern, Decreased stride length, Narrow base of support, Staggering left, Staggering right ?  ?Gait velocity interpretation: 1.31 - 2.62 ft/sec, indicative of limited community ambulator ?  ?General Gait Details: Mildly unsteady gait with staggering in both directions but no overt LOB esp noted with higher level balance challenges. See balance section for details. HR ranging from 100-111 bpm, Sp02 in high 90s on RA. 1 seated rest break post stairs. 2-3/4 DOE. ? ? ?Stairs ?Stairs: Yes ?Stairs assistance: Supervision ?Stair Management: One rail Left, Alternating pattern ?Number of Stairs: 2 ?General stair comments: Alternating pattern with rail for support, no LOB today. ? ? ?Wheelchair Mobility ?  ? ?Modified Rankin (Stroke Patients Only) ?  ? ? ?  ?Balance Overall balance assessment: Needs assistance ?Sitting-balance support: Feet supported, No upper extremity supported ?Sitting balance-Leahy Scale: Good ?  ?  ?Standing balance support: During functional activity ?Standing balance-Leahy Scale: Fair ?  ?  ?  ?  ?  ?  ?  ?  ?  ?Standardized Balance Assessment ?Standardized Balance Assessment : Dynamic Gait Index ?  ?Dynamic Gait Index ?Level Surface: Mild Impairment ?Change in Gait Speed: Mild Impairment ?Gait with Horizontal Head Turns: Mild Impairment ?Gait with Vertical Head Turns: Normal ?Gait and Pivot Turn: Mild Impairment ?Step Over Obstacle: Mild Impairment ?Step  Around Obstacles: Normal ?Steps: Mild Impairment ?Total Score: 18 ?  ? ?  ?Cognition Arousal/Alertness: Awake/alert ?Behavior During Therapy: [facility] for tasks  assessed/performed ?Overall Cognitive Status: Within Functional Limits for tasks assessed ?  ?  ?  ?  ?  ?  ?  ?  ?  ?  ?  ?  ?  ?  ?  ?  ?  ?  ?  ? ?  ?Exercises   ? ?  ?General Comments General comments (skin integrity, edema, etc.): VSS on RA< HR up to 111 bpm max with activity. Coughing throughout. ?  ?  ? ?Pertinent Vitals/Pain Pain Assessment ?Pain Assessment: No/denies pain  ? ? ?Home Living   ?  ?  ?  ?  ?  ?  ?  ?  ?  ?   ?  ?Prior Function    ?  ?  ?   ? ?PT Goals (current goals can now be found in the care plan section) Progress towards PT goals: Progressing toward goals ? ?  ?Frequency ? ? ? Min 3X/week ? ? ? ?  ?PT Plan Current plan remains appropriate  ? ? ?Co-evaluation   ?  ?  ?  ?  ? ?  ?AM-PAC PT "6 Clicks" Mobility   ?Outcome Measure ? Help needed turning from your back to your side while in a flat bed without using bedrails?: None ?Help needed moving from lying on your back to sitting on the side of a flat bed without using bedrails?: None ?Help needed moving to and from a bed to a chair (including a wheelchair)?: None ?Help needed standing up from a chair using your arms (e.g., wheelchair or bedside chair)?: A Little ?Help needed to walk in hospital room?: A Little ?Help needed climbing 3-5 steps with a railing? : A Little ?6 Click Score: 21 ? ?  ?End of Session Equipment Utilized During Treatment: Gait belt ?Activity Tolerance: Patient tolerated treatment well ?Patient left: in bed;with call bell/phone within reach ?Nurse Communication: Mobility status ?PT Visit Diagnosis: Muscle weakness (generalized) (M62.81);Difficulty in walking, not elsewhere classified (R26.2);Unsteadiness on feet (R26.81) ?  ? ? ?Time: 6389-3734 ?PT Time Calculation (min) (ACUTE ONLY): 15 min ? ?Charges:  $Neuromuscular Re-education: 8-22 mins          ?          ? ?Marisa Severin, PT, DPT ?Acute Rehabilitation Services ?Secure chat preferred ?Office [phone] ? ? ? ? ? ?[place] ?06/05/2021, 9:32 AM ? ?

## 2021-06-05 NOTE — TOC Transition Note (Addendum)
Transition of Care (TOC) - CM/SW Discharge Note ? ? ?Patient Details  ?Name: [name] ?MRN: [id number] ?Date of Birth: [birth year] ? ?Transition of Care (TOC) CM/SW Contact:  ?Zenon Mayo, RN ?Phone Number: ?06/05/2021, 9:45 AM ? ? ?Clinical Narrative:    ?Patient is for dc today, she has transport to go home. Has no needs. She will go to dc lounge to get the medications. Patient still has a line in that needs to be de-access.  ? ? ?  ?  ? ? ?Patient Goals and CMS Choice ?  ?  ?  ? ?Discharge Placement ?  ?           ?  ?  ?  ?  ? ?Discharge Plan and Services ?  ?  ?           ?  ?  ?  ?  ?  ?  ?  ?  ?  ?  ? ?Social Determinants of Health (SDOH) Interventions ?  ? ? ?Readmission Risk Interventions ?   ? View : No data to display.  ?  ?  ?  ? ? ? ? ? ?

## 2021-06-05 NOTE — Plan of Care (Incomplete)
Goals met ?

## 2021-06-05 NOTE — Care Management Important Message (Signed)
Important Message ? ?Patient Details  ?Name: [name] ?MRN: [id number] ?Date of Birth: Jul 17, 1945 ? ? ?Medicare Important Message Given:  Yes ? ? ? ? ?Shelda Altes ?06/05/2021, 8:17 AM ?

## 2021-06-05 NOTE — Progress Notes (Signed)
Mobility Specialist Progress Note: ? ? 06/05/21 1129  ?Mobility  ?Activity Ambulated independently in hallway  ?Level of Assistance Standby assist, set-up cues, supervision of patient - no hands on  ?Assistive Device None  ?Distance Ambulated (ft) 470 ft  ?Activity Response Tolerated well  ?$Mobility charge 1 Mobility  ? ?Pt asx during ambulation. Back in bed with all needs met. Eager for d/c. ? ?Nelta Numbers ?Acute Rehab ?Phone: [phone] ?Office Phone: [phone] ? ?

## 2021-06-05 NOTE — Discharge Summary (Addendum)
?Physician Discharge Summary ?  ?Patient: [name] MRN: [id number] DOB: May 07, 1945  ?Admit date:     06/01/2021  ?Discharge date: 06/05/21  ?Discharge Physician: Jimmy Picket Jamin Humphries  ? ?PCP: Lawerance Cruel, MD  ? ?Recommendations at discharge:  ? ? Patient will continue prednisone for 3 more days, continue bronchodilator therapy. ?Added aspirin and statin for presumed coronary artery disease, follow up with cardiology as outpatient.  ? ?Discharge Diagnoses: ?Principal Problem: ?  COPD with acute exacerbation ([place]) ?Active Problems: ?  Hyponatremia ?  Coronary artery disease ?  Hypothyroidism ?  Lung cancer ([name]) ?  Protein-calorie malnutrition, moderate ([name]) ? ?Resolved Problems: ?  * No resolved hospital problems. * ? ?Hospital Course: ?Mrs. [name] was admitted to the hospital with the working diagnosis of COPD exacerbation ? ?76 yo female with the past medical history of stage 3 non small cell lung cancer, (on remission), COPD, hyponatremia, and hypothyroidism, who presented with dyspnea and wheezing. Reported 4 days of wheezing, dyspnea and cough, and poor oral intake. Positive sick contact at home. On her initial physical examination her blood pressure was 100/73, HR 85, RR 18, 02 saturation 100%, dry mucous membranes, heart with S1 and S2 present with no gallops or murmurs, lungs with decreased breath sounds bilaterally, diffuse wheezing, no rales, abdomen soft and not distended, no lower extremity edema.  ? ?Na 118, K 3,8 CL 86, bicarbonate 23, glucose 104, bun 11 cr 0,32 ?BNP 869  ?High sensitive troponin 180  ?Wbc 7,5 hgb 12,7 plt 187 ?Sars covid 19 negative  ? ?Chest radiograph with hyperinflation, scarring on right upper lobe with traction of mediastinum, volume loss.  ? ?CT chest with no pulmonary embolism, stable right lung mass, in the superior segment of the right lower lobe. Stable small right pleural effusion. Right rib fractures 8 and 9 posterior lateral.  ? ?EKG 97 bpm, normal axis,  normal intervals, sinus rhythm with no significant ST segment changes, diffuse T wave inversions.  ? ?Patient was placed on bronchodilator therapy, inhaled and systemic corticosteroids with improvement of her symptoms.  ?Echocardiogram with LV preserved systolic function but with new wall motion abnormalities.  ?Consulted cardiology with recommendations of medical therapy and follow up as outpatient. ? ?Patient will continue with oral prednisone for 3 more days, follow up as outpatient.  ? ?Assessment and Plan: ?* COPD with acute exacerbation ([name]) ?Acute hypoxemic and hypercapnic respiratory failure.  ? ?Patient was admitted to the medical ward, she was placed on aggressive bronchodilatory therapy, inhaled and systemic corticosteroids with improvement in her symptoms.  ? ?Airway clearing techniques with flutter valve and incentive spirometer. ?Azithromycin for airway inflammation during her hospitalization.  ? ?Added fonase and nasal saline for sinus congestion.  ? ?At the time of her discharge her oxygenation is 98% on room air. ?Plan to continue with bronchodilator therapy and 3 more days of oral prednisone. ?If recurrent exacerbations to consider adding a inhaled corticosteroid.  ? ? ?Hyponatremia ?Hypovolemic hypo-osmolar hyponatremia ?Likely component of SIADH ? ?Patient received short course of IV fluids and then transitioned to salt tablets during her hospitalization. ?Her diet was liberated but added fluid restriction.  ? ?At the time of her discharge her serum Na is 133. Renal function with serum Na at 133, K 4,2 and serum bicarbonate at 27. ?Plan to follow up renal function as outpatient.  ? ?Coronary artery disease ?Suspected CAD.  ? ?Echocardiogram with preserved LV systolic function with EF 55 to 60% with moderate hypokinesis of  the left ventricle apical segment, inferior segment and anterior segment. Preserved RV systolic function with RVSP 32.6 mmHg.  ?No significant valvular disease.  ? ?EKG with no  ischemic changes. ?High sensitive troponin 180 and 171.  ?Patient chest pain free.  ? ?Considering new wall motion abnormalities on the left ventricle patient may need ischemic workup.  ? ?Consulted cardiology ? ? ? ? ?Hypothyroidism ?Continue with levothyroxine  ? ?Lung cancer ([name]) ?Currently on remission sp radiation and chemotherapy.  ? ?Protein-calorie malnutrition, moderate ([name]) ?Continue with nutritional supplements ?Follow up with nutrition recommendations.  ? ? ? ? ?  ? ? ?Consultants: cardiology  ?Procedures performed: none   ?Disposition: Home ?Diet recommendation:  ?Discharge Diet Orders (From admission, onward)  ? ?  Start     Ordered  ? 06/05/21 0000  Diet - low sodium heart healthy       ? 06/05/21 1032  ? ?  ?  ? ?  ? ?Regular diet ?DISCHARGE MEDICATION: ?Allergies as of 06/05/2021   ? ?   Reactions  ? Bee Venom Swelling, Other (See Comments)  ? Timolol Nausea Only  ? Brimonidine Tartrate Other (See Comments)  ? Codeine Other (See Comments)  ? Other Other (See Comments)  ? Brimonidine Nausea Only  ? Hydrocodone Itching, Rash, Other (See Comments)  ? ?  ? ?  ?Medication List  ?  ? ?STOP taking these medications   ? ?amoxicillin 500 MG capsule ?Commonly known as: AMOXIL ?  ?durvalumab 120 MG/2.4ML Soln injection ?Commonly known as: IMFINZI ?  ?Pfizer COVID-19 Vac Bivalent injection ?Generic drug: COVID-19 mRNA bivalent vaccine [employer]) ?  ? ?  ? ?TAKE these medications   ? ?albuterol 108 (90 Base) MCG/ACT inhaler ?Commonly known as: VENTOLIN HFA ?  ?Aspirin Low Dose 81 MG EC tablet ?Generic drug: aspirin ?Take 1 tablet (81 mg total) by mouth daily. Swallow whole. ?Start taking on: June 06, 2021 ?  ?atorvastatin 20 MG tablet ?Commonly known as: LIPITOR ?Take 1 tablet (20 mg total) by mouth daily. ?Start taking on: June 06, 2021 ?  ?Benefiber Powd ?Take by mouth daily. 3 scoops into water daily per patient ?  ?diphenhydrAMINE 25 MG tablet ?Commonly known as: BENADRYL ?Take 50 mg by mouth daily as  needed (allergic reaction). ?  ?dorzolamide 2 % ophthalmic solution ?Commonly known as: TRUSOPT ?Place 1 drop into both eyes 2 (two) times daily. ?  ?feeding supplement Liqd ?Take 237 mLs by mouth 2 (two) times daily between meals. ?  ?irbesartan 150 MG tablet ?Commonly known as: AVAPRO ?Take 150 mg by mouth daily. ?  ?levothyroxine 88 MCG tablet ?Commonly known as: SYNTHROID ?Take 88 mcg by mouth every morning. ?  ?lidocaine 5 % ?Commonly known as: LIDODERM ?Place 1 patch onto the skin daily. Remove & Discard patch within 12 hours or as directed by MD ?What changed:  ?when to take this ?reasons to take this ?additional instructions ?  ?lidocaine-prilocaine cream ?Commonly known as: EMLA ?Apply to affected area once ?What changed:  ?how much to take ?how to take this ?when to take this ?reasons to take this ?additional instructions ?  ?polyethylene glycol powder 17 GM/SCOOP powder ?Commonly known as: GLYCOLAX/MIRALAX ?Take 0.5 Containers by mouth daily as needed for mild constipation. ?  ?predniSONE 20 MG tablet ?Commonly known as: DELTASONE ?Take 1 tablet (20 mg total) by mouth daily with breakfast for 3 days. ?  ?Spiriva Respimat 2.5 MCG/ACT Aers ?Generic drug: Tiotropium Bromide Monohydrate ?Inhale 1 puff into the  lungs daily. ?  ?travoprost (benzalkonium) 0.004 % ophthalmic solution ?Commonly known as: TRAVATAN ?Place 1 drop into both eyes at bedtime. ?  ?Vitamin D 50 MCG (2000 UT) tablet ?Take 2,000 Units by mouth daily with supper. ?  ? ?  ? ? Follow-up Information   ? ? Lawerance Cruel, MD. Daphane Shepherd on 06/13/2021.   ?Specialty: Family Medicine ?Why: @12 :30pm ?Contact information: ?[address] ?[place] [address] ?[phone] ? ? ?  ?  ? ? Buford Dresser, MD .   ?Specialty: Cardiology ?Contact information: ?[address] ?[place] [phone] ?[phone] ? ? ?  ?  ? ?  ?  ? ?  ? ?Discharge Exam: ?Filed Weights  ? 06/04/21 0426 06/05/21 0540 06/05/21 0547  ?Weight: 42.5 kg 42.5 kg 42.5 kg   ? ?BP (!) 143/81 (BP Location: Left Arm)   Pulse 85   Temp 97.9 ?F (36.6 ?C) (Oral)   Resp 18   Ht 4\' 11"  (1.499 m)   Wt 42.5 kg   SpO2 98%   BMI 18.92 kg/m?  ? ?Patient is feeling better, dyspnea has im

## 2021-06-05 NOTE — TOC Progression Note (Signed)
Transition of Care (TOC) - Progression Note  ? ? ?Patient Details  ?Name: [name] ?MRN: [id number] ?Date of Birth: 16-Dec-1945 ? ?Transition of Care (TOC) CM/SW Contact  ?Zenon Mayo, RN ?Phone Number: ?06/05/2021, 8:22 AM ? ?Clinical Narrative:    ? ?Transition of Care Department [facility]) has reviewed patient. We will continue to monitor patient advancement through interdisciplinary progression rounds. If new patient transition needs arise, please place a TOC consult. ? ? ?  ?  ? ?Expected Discharge Plan and Services ?  ?  ?  ?  ?  ?                ?  ?  ?  ?  ?  ?  ?  ?  ?  ?  ? ? ?Social Determinants of Health (SDOH) Interventions ?  ? ?Readmission Risk Interventions ?   ? View : No data to display.  ?  ?  ?  ? ? ?

## 2021-06-13 ENCOUNTER — Inpatient Hospital Stay: Payer: Medicare Other | Attending: Hematology

## 2021-06-13 VITALS — BP 143/52 | HR 78 | Resp 17

## 2021-06-13 DIAGNOSIS — Z452 Encounter for adjustment and management of vascular access device: Secondary | ICD-10-CM | POA: Diagnosis not present

## 2021-06-13 DIAGNOSIS — C3431 Malignant neoplasm of lower lobe, right bronchus or lung: Secondary | ICD-10-CM | POA: Diagnosis not present

## 2021-06-13 MED ORDER — HEPARIN SOD (PORK) LOCK FLUSH 100 UNIT/ML IV SOLN
500.0000 [IU] | Freq: Once | INTRAVENOUS | Status: AC | PRN
  Administered 2021-06-13: 500 [IU]

## 2021-06-13 MED ORDER — SODIUM CHLORIDE 0.9% FLUSH
10.0000 mL | INTRAVENOUS | Status: DC | PRN
  Administered 2021-06-13: 10 mL

## 2021-06-13 NOTE — Patient Instructions (Signed)

## 2021-06-14 DIAGNOSIS — R799 Abnormal finding of blood chemistry, unspecified: Secondary | ICD-10-CM | POA: Diagnosis not present

## 2021-06-14 DIAGNOSIS — R7989 Other specified abnormal findings of blood chemistry: Secondary | ICD-10-CM | POA: Diagnosis not present

## 2021-06-14 DIAGNOSIS — Z09 Encounter for follow-up examination after completed treatment for conditions other than malignant neoplasm: Secondary | ICD-10-CM | POA: Diagnosis not present

## 2021-06-14 DIAGNOSIS — J441 Chronic obstructive pulmonary disease with (acute) exacerbation: Secondary | ICD-10-CM | POA: Diagnosis not present

## 2021-06-14 DIAGNOSIS — E871 Hypo-osmolality and hyponatremia: Secondary | ICD-10-CM | POA: Diagnosis not present

## 2021-06-26 ENCOUNTER — Ambulatory Visit (HOSPITAL_BASED_OUTPATIENT_CLINIC_OR_DEPARTMENT_OTHER): Payer: Medicare Other | Admitting: Cardiology

## 2021-06-26 DIAGNOSIS — H0100B Unspecified blepharitis left eye, upper and lower eyelids: Secondary | ICD-10-CM | POA: Diagnosis not present

## 2021-06-26 DIAGNOSIS — H0100A Unspecified blepharitis right eye, upper and lower eyelids: Secondary | ICD-10-CM | POA: Diagnosis not present

## 2021-06-26 DIAGNOSIS — H401131 Primary open-angle glaucoma, bilateral, mild stage: Secondary | ICD-10-CM | POA: Diagnosis not present

## 2021-06-29 IMAGING — DX DG CHEST 2V
2 series · 2 of 2 positions shown · non-contrast
Comparison: 05/19/2019

CLINICAL DATA: Persistent cough. Squamous cell carcinoma of the
right lung.

EXAM:
CHEST - 2 VIEW

[chest pa]
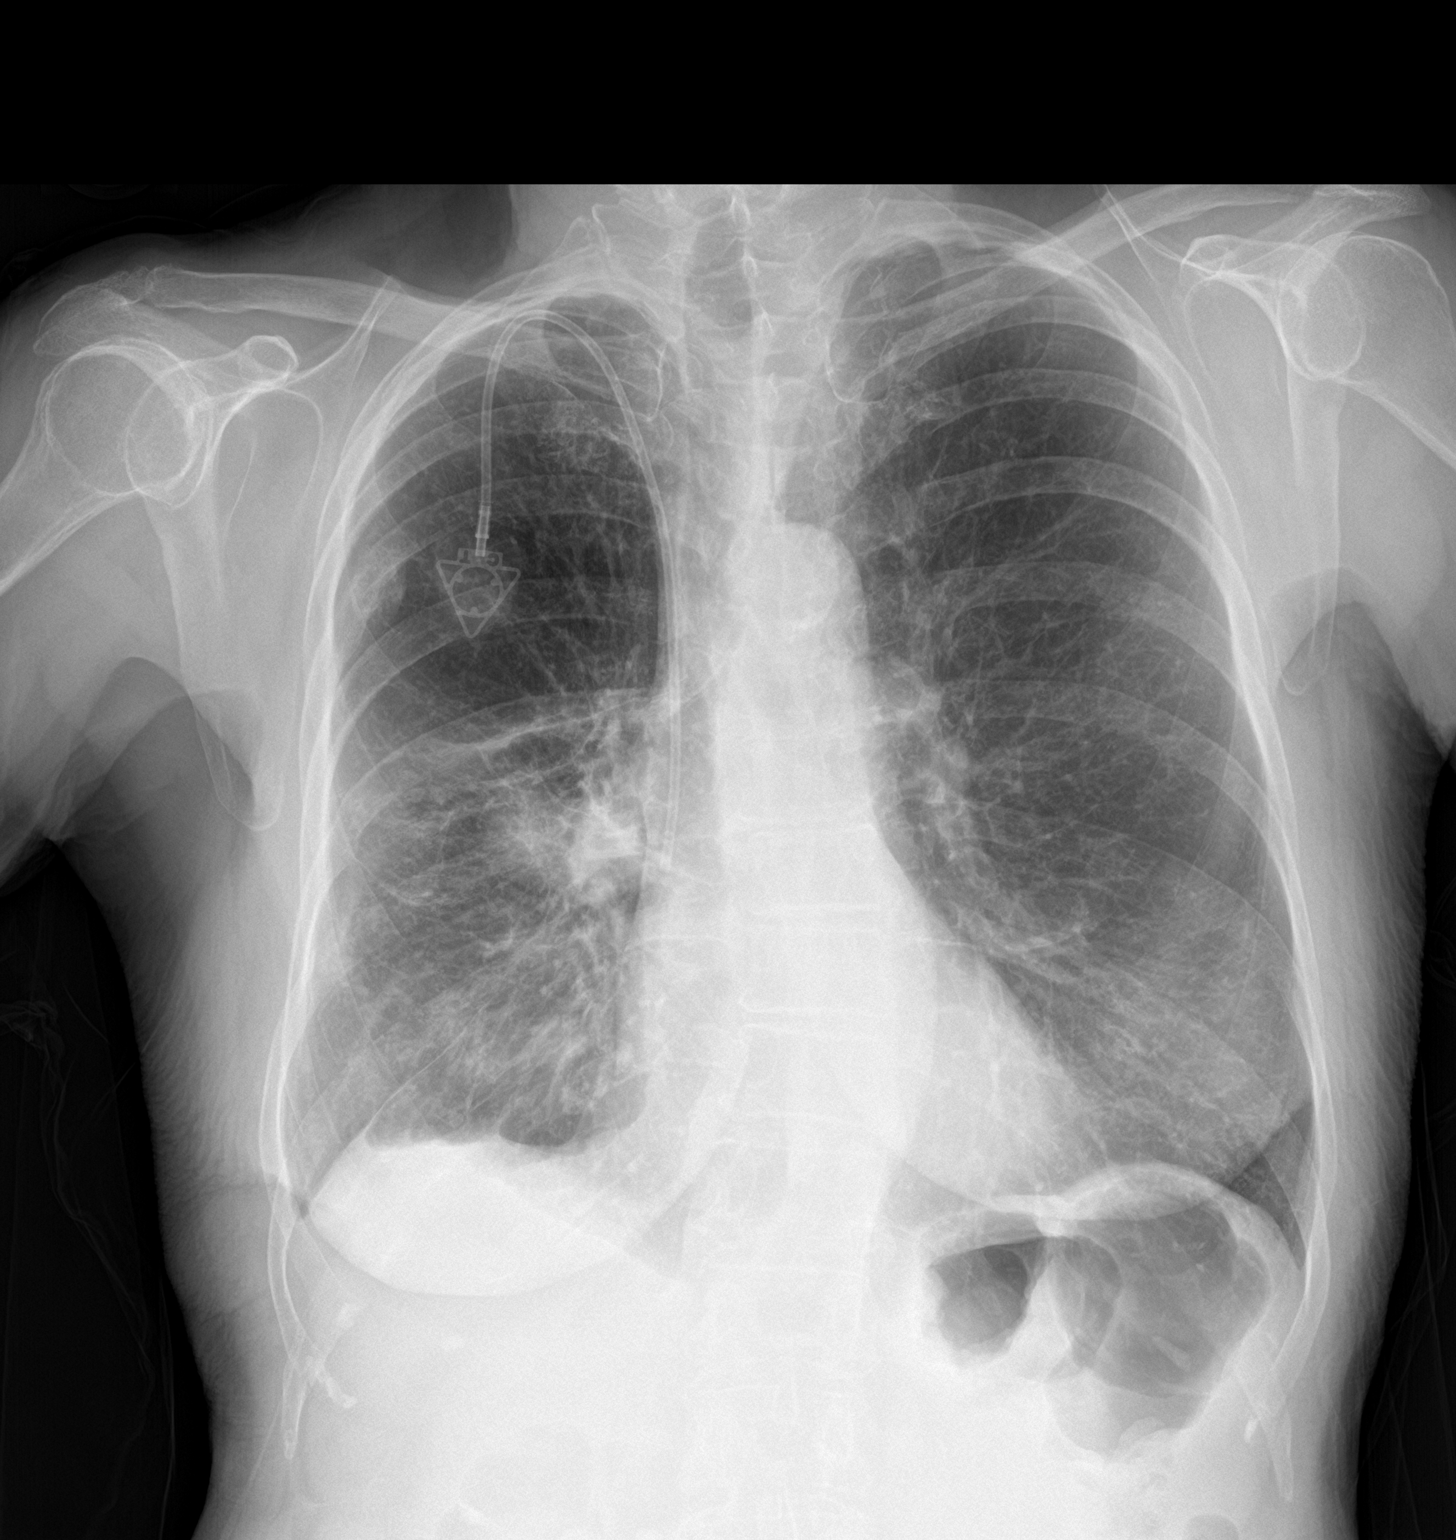

[chest lat]
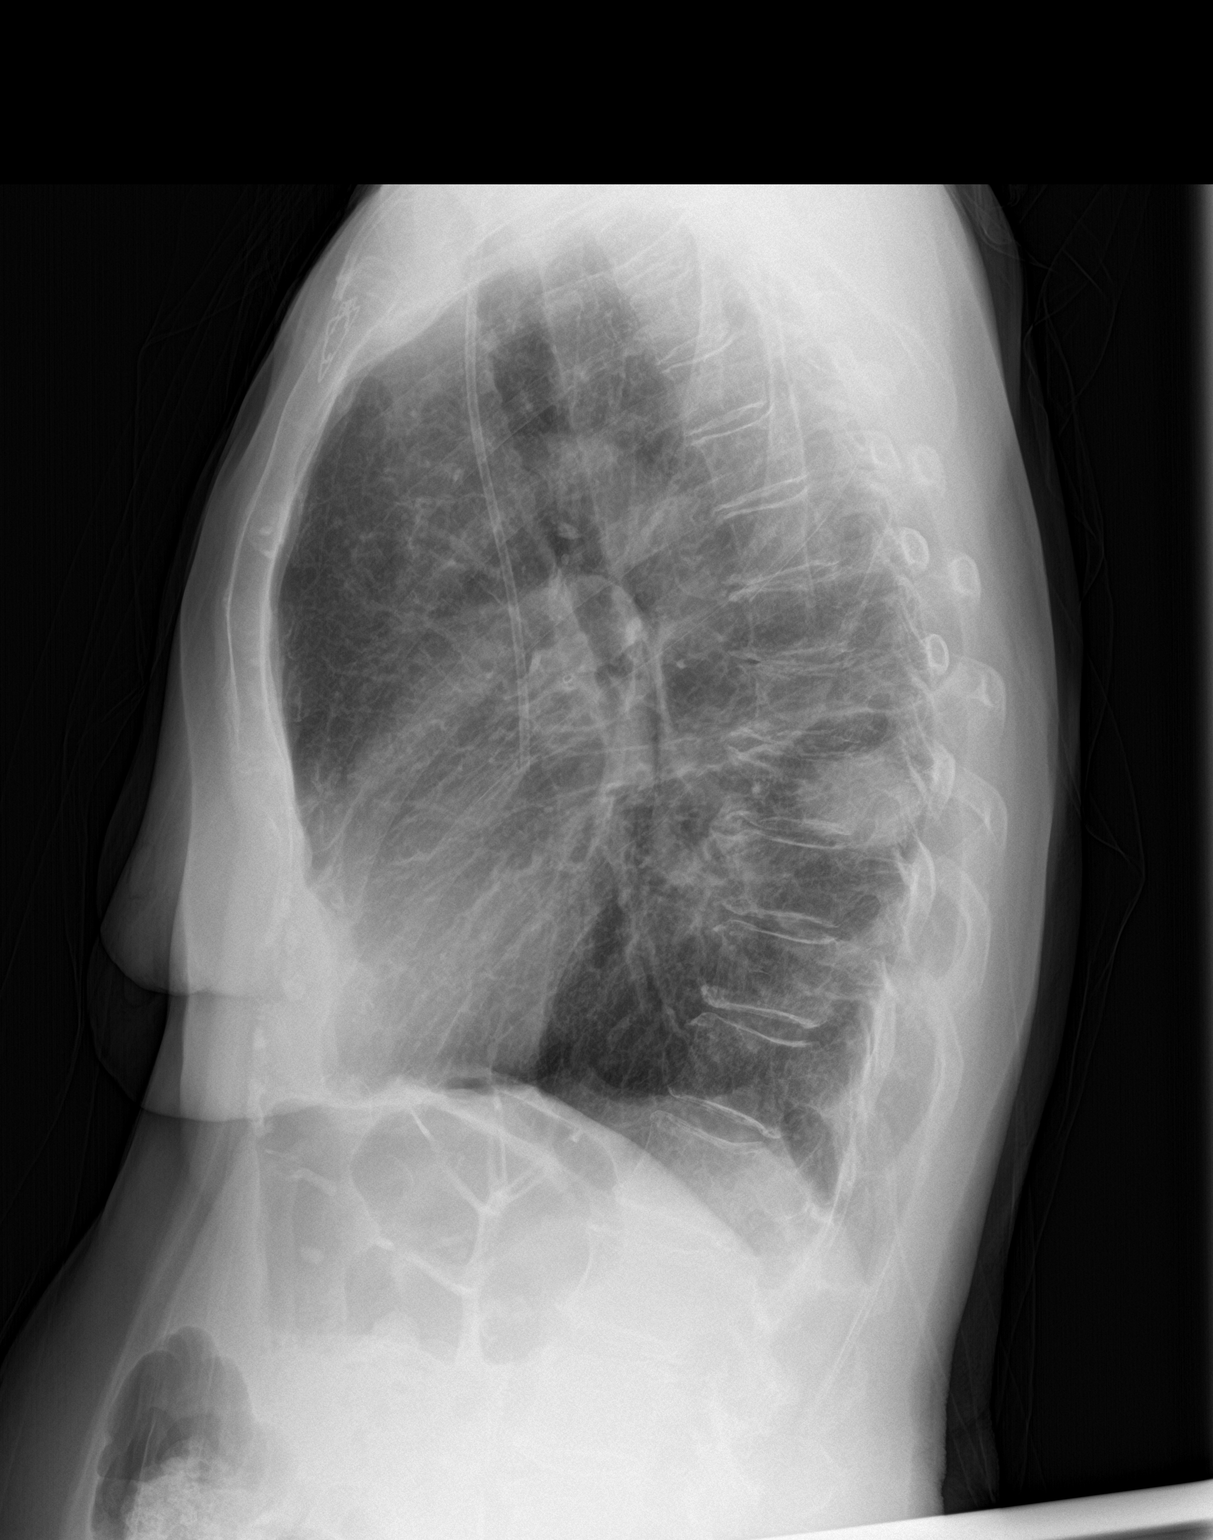

[2 of 2 positions shown; findings below may reference images not displayed]

FINDINGS: Lungs are hyperexpanded. Interstitial markings are diffusely
coarsened with chronic features. Right parahilar mass has decreased
in the interval with right lateral mid lung and infrahilar airspace
opacity, potentially related to radiation fibrosis although
pneumonia could have this appearance. Left lung clear. Possible tiny
right pleural effusion. Posterior right sixth rib fracture again
noted. The cardiopericardial silhouette is within normal limits for
size. Bones are diffusely demineralized. Right Port-A-Cath tip
overlies the distal SVC.
IMPRESSION: Patchy areas of airspace opacity in the right lower lung likely
reflect post treatment change although pneumonia cannot be excluded.

Interval decrease in right parahilar mass lesion.

## 2021-07-04 ENCOUNTER — Encounter (HOSPITAL_BASED_OUTPATIENT_CLINIC_OR_DEPARTMENT_OTHER): Payer: Self-pay | Admitting: Cardiology

## 2021-07-04 ENCOUNTER — Ambulatory Visit (HOSPITAL_BASED_OUTPATIENT_CLINIC_OR_DEPARTMENT_OTHER): Payer: Medicare Other | Admitting: Cardiology

## 2021-07-04 VITALS — BP 130/68 | HR 84 | Ht 59.0 in | Wt 96.4 lb

## 2021-07-04 DIAGNOSIS — Z7189 Other specified counseling: Secondary | ICD-10-CM

## 2021-07-04 DIAGNOSIS — R931 Abnormal findings on diagnostic imaging of heart and coronary circulation: Secondary | ICD-10-CM

## 2021-07-04 DIAGNOSIS — R9431 Abnormal electrocardiogram [ECG] [EKG]: Secondary | ICD-10-CM | POA: Diagnosis not present

## 2021-07-04 DIAGNOSIS — I700 Atherosclerosis of aorta: Secondary | ICD-10-CM

## 2021-07-04 DIAGNOSIS — Z09 Encounter for follow-up examination after completed treatment for conditions other than malignant neoplasm: Secondary | ICD-10-CM

## 2021-07-04 NOTE — Progress Notes (Signed)
Cardiology Office Note:    Date:  07/04/2021   ID:  Vickie, Melnik 1945-08-11, MRN [id number]  PCP:  Lawerance Cruel, MD  Cardiologist:  Buford Dresser, MD  Referring MD: Lawerance Cruel, MD   CC: post hospital follow up  History of Present Illness:    [name] is a 76 y.o. female with a hx of stage 3 NSCLC s/p chemo and radiation, COPD, hyponatremia, hypothyroidism who is seen for follow up today. I met her 06/04/21 during her hospitalization for COPD exacerbation.  Today: Reviewed her hospitalization, discharged 06/05/21. hsTn 180 > 171, echo with EF 55-60% with apical hypokinesis suggestive of takotsubo cardiomyopathy.  Overall getting better. Breathing better, no fevers/chills. No chest pain. Slowly increasing her activity level.  We discussed options, including further testing. Also discussed medication management. She prefers no medications but after speaking to her daughter she agreed to start medications and is tolerating well. No melena/hematochezia. Easy bruising.   Denies chest pain, shortness of breath at rest. No PND, orthopnea, LE edema or unexpected weight gain. No syncope or palpitations.   Past Medical History:  Diagnosis Date   Dyspnea    Glaucoma    Headache    Hypothyroidism    Right lower lobe lung mass    Thyroid condition     Past Surgical History:  Procedure Laterality Date   BIOPSY  05/11/2019   Procedure: BIOPSY;  Surgeon: Laurin Coder, MD;  Location: [facility];  Service: Pulmonary;;   BRONCHIAL WASHINGS  05/11/2019   Procedure: BRONCHIAL WASHINGS;  Surgeon: Laurin Coder, MD;  Location: [facility];  Service: Pulmonary;;   CATARACT EXTRACTION, BILATERAL     1 was a lens implant   COLONOSCOPY     FINE NEEDLE ASPIRATION  05/11/2019   Procedure: FINE NEEDLE ASPIRATION (FNA) LINEAR;  Surgeon: Laurin Coder, MD;  Location: [facility];  Service: Pulmonary;;   IR IMAGING GUIDED PORT INSERTION  05/27/2019    MEDIASTINOSCOPY N/A 05/21/2019   Procedure: MEDIASTINOSCOPY;  Surgeon: Grace Isaac, MD;  Location: [facility];  Service: Thoracic;  Laterality: N/A;   SHOULDER SURGERY     VIDEO BRONCHOSCOPY WITH ENDOBRONCHIAL ULTRASOUND N/A 05/11/2019   Procedure: VIDEO BRONCHOSCOPY WITH ENDOBRONCHIAL ULTRASOUND;  Surgeon: Laurin Coder, MD;  Location: [facility];  Service: Pulmonary;  Laterality: N/A;   VIDEO BRONCHOSCOPY WITH ENDOBRONCHIAL ULTRASOUND N/A 05/21/2019   Procedure: VIDEO BRONCHOSCOPY WITH ENDOBRONCHIAL ULTRASOUND;  Surgeon: Grace Isaac, MD;  Location: [facility];  Service: Thoracic;  Laterality: N/A;   vocal cord polyp removal     WISDOM TOOTH EXTRACTION      Current Medications: Current Outpatient Medications on File Prior to Visit  Medication Sig   albuterol (VENTOLIN HFA) 108 (90 Base) MCG/ACT inhaler    aspirin 81 MG EC tablet Take 1 tablet (81 mg total) by mouth daily. Swallow whole.   atorvastatin (LIPITOR) 20 MG tablet Take 1 tablet (20 mg total) by mouth daily.   Cholecalciferol (VITAMIN D) 50 MCG (2000 UT) tablet Take 2,000 Units by mouth daily with supper.   diphenhydrAMINE (BENADRYL) 25 MG tablet Take 50 mg by mouth daily as needed (allergic reaction).   dorzolamide (TRUSOPT) 2 % ophthalmic solution Place 1 drop into both eyes 2 (two) times daily.    irbesartan (AVAPRO) 150 MG tablet Take 150 mg by mouth daily.   levothyroxine (SYNTHROID) 88 MCG tablet Take 88 mcg by mouth every morning.   lidocaine (LIDODERM) 5 % Place  1 patch onto the skin daily. Remove & Discard patch within 12 hours or as directed by MD (Patient taking differently: Place 1 patch onto the skin daily as needed (For pain).)   lidocaine-prilocaine (EMLA) cream Apply to affected area once (Patient taking differently: Apply 1 application. topically daily as needed (For port access).)   Nutritional Supplements (BOOST HIGH PROTEIN PO) Take by mouth daily.   polyethylene glycol powder (GLYCOLAX/MIRALAX) 17  GM/SCOOP powder Take 0.5 Containers by mouth daily as needed for mild constipation.   SPIRIVA RESPIMAT 2.5 MCG/ACT AERS Inhale 1 puff into the lungs daily.   travoprost, benzalkonium, (TRAVATAN) 0.004 % ophthalmic solution Place 1 drop into both eyes at bedtime.   Wheat Dextrin (BENEFIBER) POWD Take by mouth daily. 3 scoops into water daily per patient   Current Facility-Administered Medications on File Prior to Visit  Medication   sodium chloride flush (NS) 0.9 % injection 10 mL     Allergies:   Bee venom, Timolol, Brimonidine tartrate, Codeine, Other, Brimonidine, and Hydrocodone   Social History   Tobacco Use   Smoking status: Former    Types: Cigarettes    Quit date: 02/13/2010    Years since quitting: 11.3   Smokeless tobacco: Never   Tobacco comments:    Quit in 2011  Vaping Use   Vaping Use: Former   Devices: used 2-3 weeks while she was quitting cigarettes  Substance Use Topics   Alcohol use: Yes    Comment: very rare, 1 glass of wine maybe every 3-4 weeks   Drug use: No    Family History: family history includes CAD in her father; Heart failure in her father.  ROS:   Please see the history of present illness.  Additional pertinent ROS otherwise unremarkable.  EKGs/Labs/Other Studies Reviewed:    The following studies were reviewed today: Echo: 06/02/21  1. Left ventricular ejection fraction, by estimation, is 55 to 60%. Left  ventricular ejection fraction by 2D MOD biplane is 57.3 %. The left  ventricle has normal function. The left ventricle demonstrates regional  wall motion abnormalities (see scoring  diagram/findings for description). Left ventricular diastolic parameters  are consistent with Grade I diastolic dysfunction (impaired relaxation).  There is moderate hypokinesis of the left ventricular, apical apical  segment, inferior segment and anterior  segment.   2. Right ventricular systolic function is normal. The right ventricular  size is normal.  There is normal pulmonary artery systolic pressure. The  estimated right ventricular systolic pressure is 01.6 mmHg.   3. The mitral valve is grossly normal. Trivial mitral valve  regurgitation.   4. The aortic valve is tricuspid. Aortic valve regurgitation is not  visualized. Aortic valve sclerosis is present, with no evidence of aortic  valve stenosis.   5. The inferior vena cava is normal in size with greater than 50%  respiratory variability, suggesting right atrial pressure of 3 mmHg.   Comparison(s): No prior Echocardiogram.   Conclusion(s)/Recommendation(s): Findings suggestive of possibe Takatsubo  cardiomyoapthy or less likely distal LAD territory ischemia, especially  given the relatively low and flat troponin elevation.   FINDINGS   Left Ventricle: Left ventricular ejection fraction, by estimation, is 55  to 60%. Left ventricular ejection fraction by 2D MOD biplane is 57.3 %.  The left ventricle has normal function. The left ventricle demonstrates  regional wall motion abnormalities.  Moderate hypokinesis of the left ventricular, apical apical segment,  inferior segment and anterior segment. The left ventricular internal  cavity size was  normal in size. There is no left ventricular hypertrophy.  Left ventricular diastolic parameters are  consistent with Grade I diastolic dysfunction (impaired relaxation).  Indeterminate filling pressures.      LV Wall Scoring:  The apical anterior segment, apical inferior segment, and apex are  hypokinetic.   EKG:  EKG is personally reviewed.   07/04/21: not ordered today  Recent Labs: 06/01/2021: ALT 19; B Natriuretic Peptide 869.6; Hemoglobin 12.7; Platelets 187; TSH 0.685 06/04/2021: BUN 11; Creatinine, Ser 0.51; Potassium 4.2; Sodium 133  Recent Lipid Panel    Component Value Date/Time   CHOL 121 06/05/2021 0540   TRIG 37 06/05/2021 0540   HDL 61 06/05/2021 0540   CHOLHDL 2.0 06/05/2021 0540   VLDL 7 06/05/2021 0540    LDLCALC 53 06/05/2021 0540    Physical Exam:    VS:  BP 130/68 (BP Location: Left Arm, Patient Position: Sitting)   Pulse 84   Ht 4' 11"  (1.499 m)   Wt 96 lb 6.4 oz (43.7 kg)   SpO2 99%   BMI 19.47 kg/m     Wt Readings from Last 3 Encounters:  07/04/21 96 lb 6.4 oz (43.7 kg)  06/05/21 93 lb 11.1 oz (42.5 kg)  04/30/21 97 lb (44 kg)    GEN: Well nourished, well developed in no acute distress HEENT: Normal, moist mucous membranes NECK: No JVD CARDIAC: regular rhythm, normal S1 and S2, no rubs or gallops. No murmur. VASCULAR: Radial and DP pulses 2+ bilaterally. No carotid bruits RESPIRATORY:  Clear to auscultation without rales, wheezing or rhonchi  ABDOMEN: Soft, non-tender, non-distended MUSCULOSKELETAL:  Ambulates independently SKIN: Warm and dry, no edema NEUROLOGIC:  Alert and oriented x 3. No focal neuro deficits noted. PSYCHIATRIC:  Normal affect    ASSESSMENT:    1. Hospital discharge follow-up   2. Aortic atherosclerosis ([place])   3. Abnormal echocardiogram   4. Abnormal ECG   5. Cardiac risk counseling   6. Counseling on health promotion and disease prevention    PLAN:    Elevated troponin during hospitalization Preserved LVEF with mild hypokinesis at apex/abnormal echo Diffuse TWI/abnormal ECG Aortic atherosclerosis -discussed beta blocker, will hold given patient preference and gradual recovery from COPD exacerbation -discussed further testing, such as with CT coronary or stress test. After shared decision making, will hold on testing at this time -continue aspirin and statin -counseled on red flag warning signs that need immediate medical attention  Cardiac risk counseling and prevention recommendations: -recommend heart healthy/Mediterranean diet, with whole grains, fruits, vegetable, fish, lean meats, nuts, and olive oil. Limit salt. -recommend moderate walking, 3-5 times/week for 30-50 minutes each session. Aim for at least 150 minutes.week. Goal should  be pace of 3 miles/hours, or walking 1.5 miles in 30 minutes -recommend avoidance of tobacco products. Avoid excess alcohol. -ASCVD risk score: The ASCVD Risk score (Arnett DK, et al., 2019) failed to calculate for the following reasons:   The valid total cholesterol range is 130 to 320 mg/dL    Plan for follow up: 6 mos  Buford Dresser, MD, PhD, [facility]    Medication Adjustments/Labs and Tests Ordered: Current medicines are reviewed at length with the patient today.  Concerns regarding medicines are outlined above.  No orders of the defined types were placed in this encounter.  No orders of the defined types were placed in this encounter.   Patient Instructions  Medication Instructions:  Your Physician recommend you continue on your current medication as  directed.    *If you need a refill on your cardiac medications before your next appointment, please call your pharmacy*   Lab Work: None ordered today   Testing/Procedures: None ordered today   Follow-Up: At [facility], you and your health needs are our priority.  As part of our continuing mission to provide you with exceptional heart care, we have created designated Provider Care Teams.  These Care Teams include your primary Cardiologist (physician) and Advanced Practice Providers (APPs -  Physician Assistants and Nurse Practitioners) who all work together to provide you with the care you need, when you need it.  We recommend signing up for the patient portal called "MyChart".  Sign up information is provided on this After Visit Summary.  MyChart is used to connect with patients for Virtual Visits (Telemedicine).  Patients are able to view lab/test results, encounter notes, upcoming appointments, etc.  Non-urgent messages can be sent to your provider as well.   To learn more about what you can do with MyChart, go to [email or web address].    Your next appointment:   6 month(s)  The  format for your next appointment:   In Person  Provider:   Buford Dresser, MD{          Signed, Buford Dresser, MD PhD 07/04/2021 1:31 PM    [place]

## 2021-07-04 NOTE — Patient Instructions (Signed)
Medication Instructions:  Your Physician recommend you continue on your current medication as directed.    *If you need a refill on your cardiac medications before your next appointment, please call your pharmacy*   Lab Work: None ordered today   Testing/Procedures: None ordered today   Follow-Up: At [facility], you and your health needs are our priority.  As part of our continuing mission to provide you with exceptional heart care, we have created designated Provider Care Teams.  These Care Teams include your primary Cardiologist (physician) and Advanced Practice Providers (APPs -  Physician Assistants and Nurse Practitioners) who all work together to provide you with the care you need, when you need it.  We recommend signing up for the patient portal called "MyChart".  Sign up information is provided on this After Visit Summary.  MyChart is used to connect with patients for Virtual Visits (Telemedicine).  Patients are able to view lab/test results, encounter notes, upcoming appointments, etc.  Non-urgent messages can be sent to your provider as well.   To learn more about what you can do with MyChart, go to [email or web address].    Your next appointment:   6 month(s)  The format for your next appointment:   In Person  Provider:   Buford Dresser, MD{

## 2021-07-05 ENCOUNTER — Telehealth: Payer: Self-pay | Admitting: Cardiology

## 2021-07-05 MED ORDER — ATORVASTATIN CALCIUM 20 MG PO TABS: 20.0000 mg | ORAL_TABLET | Freq: Every day | ORAL | 2 refills | Status: DC

## 2021-07-05 MED ORDER — ASPIRIN 81 MG PO TBEC: 81.0000 mg | DELAYED_RELEASE_TABLET | Freq: Every day | ORAL | 2 refills | Status: DC

## 2021-07-05 NOTE — Telephone Encounter (Signed)
Rx(s) sent to pharmacy electronically.  

## 2021-07-05 NOTE — Telephone Encounter (Signed)
*  STAT* If patient is at the pharmacy, call can be transferred to refill team.   1. Which medications need to be refilled? (please list name of each medication and dose if known) aspirin 81 MG EC tablet  atorvastatin (LIPITOR) 20 MG tablet   2. Which pharmacy/location (including street and city if local pharmacy) is medication to be sent to?   atorvastatin (LIPITOR) 20 MG tablet   3. Do they need a 30 day or 90 day supply?  30 day

## 2021-07-06 MED ORDER — ATORVASTATIN CALCIUM 20 MG PO TABS: 20.0000 mg | ORAL_TABLET | Freq: Every day | ORAL | 3 refills | Status: DC

## 2021-07-06 MED ORDER — ASPIRIN 81 MG PO TBEC: 81.0000 mg | DELAYED_RELEASE_TABLET | Freq: Every day | ORAL | 2 refills | Status: AC

## 2021-07-06 NOTE — Addendum Note (Signed)
Addended by: Meryl Crutch on: 07/06/2021 04:53 PM   Modules accepted: Orders

## 2021-07-06 NOTE — Telephone Encounter (Signed)
*  STAT* If patient is at the pharmacy, call can be transferred to refill team.   1. Which medications need to be refilled? (please list name of each medication and dose if known)  atorvastatin (LIPITOR) 20 MG tablet aspirin EC 81 MG tablet  2. Which pharmacy/location (including street and city if local pharmacy) is medication to be sent to? [id number] - [place], [place] - [address]  3. Do they need a 30 day or 90 day supply?  30 day supply  Patient following up. She would like to have her prescription transferred to the pharmacy listed above.

## 2021-07-30 ENCOUNTER — Other Ambulatory Visit: Payer: Self-pay | Admitting: Pharmacist

## 2021-08-01 ENCOUNTER — Ambulatory Visit (HOSPITAL_COMMUNITY)
Admission: RE | Admit: 2021-08-01 | Discharge: 2021-08-01 | Disposition: A | Discharge status: Home / Self Care | Payer: Medicare Other | Source: Ambulatory Visit | Attending: Hematology & Oncology | Admitting: Hematology & Oncology

## 2021-08-01 DIAGNOSIS — C3490 Malignant neoplasm of unspecified part of unspecified bronchus or lung: Secondary | ICD-10-CM | POA: Diagnosis not present

## 2021-08-01 DIAGNOSIS — J90 Pleural effusion, not elsewhere classified: Secondary | ICD-10-CM | POA: Diagnosis not present

## 2021-08-01 DIAGNOSIS — C3491 Malignant neoplasm of unspecified part of right bronchus or lung: Secondary | ICD-10-CM | POA: Diagnosis not present

## 2021-08-01 DIAGNOSIS — Z5111 Encounter for antineoplastic chemotherapy: Secondary | ICD-10-CM | POA: Diagnosis not present

## 2021-08-01 DIAGNOSIS — J929 Pleural plaque without asbestos: Secondary | ICD-10-CM | POA: Diagnosis not present

## 2021-08-01 LAB — GLUCOSE, CAPILLARY: Glucose-Capillary: 82 mg/dL (ref 70–99)

## 2021-08-01 MED ORDER — FLUDEOXYGLUCOSE F - 18 (FDG) INJECTION
5.0000 | Freq: Once | INTRAVENOUS | Status: AC
  Administered 2021-08-01: 5.17 via INTRAVENOUS

## 2021-08-02 ENCOUNTER — Telehealth: Payer: Self-pay | Admitting: *Deleted

## 2021-08-02 NOTE — Telephone Encounter (Signed)
Message left to notify pt per order of Dr. Marin Olp "that the PET scan does not show any obvious active cancer.  This is fantastic. This is a good way to start summer.  Pete"  Instructed pt to call office back with any questions or concerns.

## 2021-08-02 NOTE — Telephone Encounter (Signed)
-----   Message from [name], MD sent at 08/02/2021  2:05 PM EDT ----- Please call and let her know that the PET scan does not show any obvious active cancer.  This is fantastic.  This is a good way to start summer.  [name]

## 2021-08-07 ENCOUNTER — Inpatient Hospital Stay: Payer: Medicare Other

## 2021-08-07 ENCOUNTER — Encounter: Payer: Self-pay | Admitting: Hematology & Oncology

## 2021-08-07 ENCOUNTER — Inpatient Hospital Stay: Payer: Medicare Other | Admitting: Hematology & Oncology

## 2021-08-07 ENCOUNTER — Inpatient Hospital Stay: Payer: Medicare Other | Attending: Hematology

## 2021-08-07 VITALS — BP 123/70 | HR 80 | Temp 97.6°F | Resp 20 | Ht 59.5 in | Wt 97.0 lb

## 2021-08-07 DIAGNOSIS — Z923 Personal history of irradiation: Secondary | ICD-10-CM | POA: Diagnosis not present

## 2021-08-07 DIAGNOSIS — C3431 Malignant neoplasm of lower lobe, right bronchus or lung: Secondary | ICD-10-CM | POA: Diagnosis not present

## 2021-08-07 DIAGNOSIS — C3410 Malignant neoplasm of upper lobe, unspecified bronchus or lung: Secondary | ICD-10-CM | POA: Diagnosis not present

## 2021-08-07 DIAGNOSIS — C3490 Malignant neoplasm of unspecified part of unspecified bronchus or lung: Secondary | ICD-10-CM

## 2021-08-07 LAB — CBC WITH DIFFERENTIAL (CANCER CENTER ONLY)
Abs Immature Granulocytes: 0.01 10*3/uL (ref 0.00–0.07)
Basophils Absolute: 0 10*3/uL (ref 0.0–0.1)
Basophils Relative: 1 %
Eosinophils Absolute: 0.1 10*3/uL (ref 0.0–0.5)
Eosinophils Relative: 2 %
HCT: 35.1 % — ABNORMAL LOW (ref 36.0–46.0)
Hemoglobin: 11.9 g/dL — ABNORMAL LOW (ref 12.0–15.0)
Immature Granulocytes: 0 %
Lymphocytes Relative: 19 %
Lymphs Abs: 0.9 10*3/uL (ref 0.7–4.0)
MCH: 32 pg (ref 26.0–34.0)
MCHC: 33.9 g/dL (ref 30.0–36.0)
MCV: 94.4 fL (ref 80.0–100.0)
Monocytes Absolute: 0.5 10*3/uL (ref 0.1–1.0)
Monocytes Relative: 10 %
Neutro Abs: 3.2 10*3/uL (ref 1.7–7.7)
Neutrophils Relative %: 68 %
Platelet Count: 192 10*3/uL (ref 150–400)
RBC: 3.72 MIL/uL — ABNORMAL LOW (ref 3.87–5.11)
RDW: 13.3 % (ref 11.5–15.5)
WBC Count: 4.7 10*3/uL (ref 4.0–10.5)
nRBC: 0 % (ref 0.0–0.2)

## 2021-08-07 LAB — LACTATE DEHYDROGENASE: LDH: 139 U/L (ref 98–192)

## 2021-08-07 LAB — CMP (CANCER CENTER ONLY)
ALT: 13 U/L (ref 0–44)
AST: 8 U/L — ABNORMAL LOW (ref 15–41)
Albumin: 4.5 g/dL (ref 3.5–5.0)
Alkaline Phosphatase: 50 U/L (ref 38–126)
Anion gap: 6 (ref 5–15)
BUN: 16 mg/dL (ref 8–23)
CO2: 27 mmol/L (ref 22–32)
Calcium: 9.7 mg/dL (ref 8.9–10.3)
Chloride: 99 mmol/L (ref 98–111)
Creatinine: 0.49 mg/dL (ref 0.44–1.00)
GFR, Estimated: >60 mL/min (ref >60)
Glucose, Bld: 92 mg/dL (ref 70–99)
Potassium: 3.8 mmol/L (ref 3.5–5.1)
Sodium: 132 mmol/L — ABNORMAL LOW (ref 135–145)
Total Bilirubin: 0.5 mg/dL (ref 0.3–1.2)
Total Protein: 6.9 g/dL (ref 6.5–8.1)

## 2021-08-07 MED ORDER — HEPARIN SOD (PORK) LOCK FLUSH 100 UNIT/ML IV SOLN
500.0000 [IU] | Freq: Once | INTRAVENOUS | Status: AC | PRN
  Administered 2021-08-07: 500 [IU]

## 2021-08-07 MED ORDER — SODIUM CHLORIDE 0.9% FLUSH
10.0000 mL | INTRAVENOUS | Status: DC | PRN
  Administered 2021-08-07: 10 mL

## 2021-09-05 DIAGNOSIS — K5900 Constipation, unspecified: Secondary | ICD-10-CM | POA: Diagnosis not present

## 2021-09-05 DIAGNOSIS — Z1211 Encounter for screening for malignant neoplasm of colon: Secondary | ICD-10-CM | POA: Diagnosis not present

## 2021-09-05 DIAGNOSIS — K219 Gastro-esophageal reflux disease without esophagitis: Secondary | ICD-10-CM | POA: Diagnosis not present

## 2021-09-10 DIAGNOSIS — H0014 Chalazion left upper eyelid: Secondary | ICD-10-CM | POA: Diagnosis not present

## 2021-09-18 DIAGNOSIS — N952 Postmenopausal atrophic vaginitis: Secondary | ICD-10-CM | POA: Diagnosis not present

## 2021-09-18 DIAGNOSIS — M8580 Other specified disorders of bone density and structure, unspecified site: Secondary | ICD-10-CM | POA: Diagnosis not present

## 2021-09-20 DIAGNOSIS — J029 Acute pharyngitis, unspecified: Secondary | ICD-10-CM | POA: Diagnosis not present

## 2021-10-09 ENCOUNTER — Inpatient Hospital Stay: Payer: Medicare Other | Attending: Hematology

## 2021-10-09 VITALS — BP 147/48 | HR 81 | Temp 97.9°F | Resp 20

## 2021-10-09 DIAGNOSIS — Z452 Encounter for adjustment and management of vascular access device: Secondary | ICD-10-CM | POA: Diagnosis not present

## 2021-10-09 DIAGNOSIS — C3431 Malignant neoplasm of lower lobe, right bronchus or lung: Secondary | ICD-10-CM | POA: Diagnosis not present

## 2021-10-09 DIAGNOSIS — Z95828 Presence of other vascular implants and grafts: Secondary | ICD-10-CM

## 2021-10-09 MED ORDER — SODIUM CHLORIDE 0.9% FLUSH
10.0000 mL | Freq: Once | INTRAVENOUS | Status: AC
  Administered 2021-10-09: 10 mL via INTRAVENOUS

## 2021-10-09 MED ORDER — HEPARIN SOD (PORK) LOCK FLUSH 100 UNIT/ML IV SOLN
500.0000 [IU] | Freq: Once | INTRAVENOUS | Status: AC
  Administered 2021-10-09: 500 [IU] via INTRAVENOUS

## 2021-10-09 NOTE — Patient Instructions (Signed)

## 2021-10-31 DIAGNOSIS — E039 Hypothyroidism, unspecified: Secondary | ICD-10-CM | POA: Diagnosis not present

## 2021-11-08 DIAGNOSIS — E039 Hypothyroidism, unspecified: Secondary | ICD-10-CM | POA: Diagnosis not present

## 2021-11-08 DIAGNOSIS — J439 Emphysema, unspecified: Secondary | ICD-10-CM | POA: Diagnosis not present

## 2021-11-08 DIAGNOSIS — E559 Vitamin D deficiency, unspecified: Secondary | ICD-10-CM | POA: Diagnosis not present

## 2021-11-08 DIAGNOSIS — Z0000 Encounter for general adult medical examination without abnormal findings: Secondary | ICD-10-CM | POA: Diagnosis not present

## 2021-11-12 DIAGNOSIS — M810 Age-related osteoporosis without current pathological fracture: Secondary | ICD-10-CM | POA: Diagnosis not present

## 2021-12-03 ENCOUNTER — Other Ambulatory Visit (HOSPITAL_BASED_OUTPATIENT_CLINIC_OR_DEPARTMENT_OTHER): Payer: Self-pay

## 2021-12-03 MED ORDER — COMIRNATY 30 MCG/0.3ML IM SUSY
PREFILLED_SYRINGE | INTRAMUSCULAR | 0 refills | Status: DC
  Filled 2021-12-03: qty 0.3, 1d supply, fill #0

## 2021-12-12 ENCOUNTER — Inpatient Hospital Stay: Payer: Medicare Other | Admitting: Hematology & Oncology

## 2021-12-12 ENCOUNTER — Inpatient Hospital Stay: Payer: Medicare Other

## 2021-12-12 ENCOUNTER — Encounter (HOSPITAL_COMMUNITY)
Admission: RE | Admit: 2021-12-12 | Discharge: 2021-12-12 | Disposition: A | Discharge status: Home / Self Care | Payer: Medicare Other | Source: Ambulatory Visit | Attending: Hematology & Oncology | Admitting: Hematology & Oncology

## 2021-12-12 DIAGNOSIS — C3410 Malignant neoplasm of upper lobe, unspecified bronchus or lung: Secondary | ICD-10-CM | POA: Diagnosis not present

## 2021-12-12 DIAGNOSIS — R918 Other nonspecific abnormal finding of lung field: Secondary | ICD-10-CM | POA: Diagnosis not present

## 2021-12-12 LAB — GLUCOSE, CAPILLARY: Glucose-Capillary: 86 mg/dL (ref 70–99)

## 2021-12-12 MED ORDER — FLUDEOXYGLUCOSE F - 18 (FDG) INJECTION
5.0000 | Freq: Once | INTRAVENOUS | Status: AC
  Administered 2021-12-12: 5.6 via INTRAVENOUS

## 2021-12-14 ENCOUNTER — Telehealth: Payer: Self-pay

## 2021-12-14 DIAGNOSIS — M810 Age-related osteoporosis without current pathological fracture: Secondary | ICD-10-CM | POA: Diagnosis not present

## 2021-12-14 DIAGNOSIS — Z6821 Body mass index (BMI) 21.0-21.9, adult: Secondary | ICD-10-CM | POA: Diagnosis not present

## 2021-12-14 DIAGNOSIS — E559 Vitamin D deficiency, unspecified: Secondary | ICD-10-CM | POA: Diagnosis not present

## 2021-12-14 NOTE — Telephone Encounter (Signed)
-----   Message from Meghan Napoleon, MD sent at 12/13/2021  5:12 PM EDT ----- Please call and let him know that the PET scan does not show any obvious cancer.  Thanks.  [name]

## 2021-12-17 ENCOUNTER — Inpatient Hospital Stay: Payer: Medicare Other

## 2021-12-17 ENCOUNTER — Encounter: Payer: Self-pay | Admitting: Hematology & Oncology

## 2021-12-17 ENCOUNTER — Inpatient Hospital Stay: Payer: Medicare Other | Admitting: Hematology & Oncology

## 2021-12-17 ENCOUNTER — Inpatient Hospital Stay: Payer: Medicare Other | Attending: Hematology

## 2021-12-17 VITALS — BP 148/50 | HR 73 | Temp 98.0°F | Resp 19 | Ht 59.5 in | Wt 103.0 lb

## 2021-12-17 DIAGNOSIS — C3401 Malignant neoplasm of right main bronchus: Secondary | ICD-10-CM | POA: Diagnosis not present

## 2021-12-17 DIAGNOSIS — Z923 Personal history of irradiation: Secondary | ICD-10-CM | POA: Insufficient documentation

## 2021-12-17 DIAGNOSIS — C3431 Malignant neoplasm of lower lobe, right bronchus or lung: Secondary | ICD-10-CM | POA: Diagnosis not present

## 2021-12-17 DIAGNOSIS — C3410 Malignant neoplasm of upper lobe, unspecified bronchus or lung: Secondary | ICD-10-CM

## 2021-12-17 LAB — CBC WITH DIFFERENTIAL (CANCER CENTER ONLY)
Abs Immature Granulocytes: 0.01 10*3/uL (ref 0.00–0.07)
Basophils Absolute: 0 10*3/uL (ref 0.0–0.1)
Basophils Relative: 1 %
Eosinophils Absolute: 0.3 10*3/uL (ref 0.0–0.5)
Eosinophils Relative: 6 %
HCT: 35.2 % — ABNORMAL LOW (ref 36.0–46.0)
Hemoglobin: 11.7 g/dL — ABNORMAL LOW (ref 12.0–15.0)
Immature Granulocytes: 0 %
Lymphocytes Relative: 20 %
Lymphs Abs: 0.9 10*3/uL (ref 0.7–4.0)
MCH: 30.5 pg (ref 26.0–34.0)
MCHC: 33.2 g/dL (ref 30.0–36.0)
MCV: 91.7 fL (ref 80.0–100.0)
Monocytes Absolute: 0.6 10*3/uL (ref 0.1–1.0)
Monocytes Relative: 13 %
Neutro Abs: 2.7 10*3/uL (ref 1.7–7.7)
Neutrophils Relative %: 60 %
Platelet Count: 204 10*3/uL (ref 150–400)
RBC: 3.84 MIL/uL — ABNORMAL LOW (ref 3.87–5.11)
RDW: 12.8 % (ref 11.5–15.5)
WBC Count: 4.5 10*3/uL (ref 4.0–10.5)
nRBC: 0 % (ref 0.0–0.2)

## 2021-12-17 LAB — CMP (CANCER CENTER ONLY)
ALT: 15 U/L (ref 0–44)
AST: 10 U/L — ABNORMAL LOW (ref 15–41)
Albumin: 4.3 g/dL (ref 3.5–5.0)
Alkaline Phosphatase: 75 U/L (ref 38–126)
Anion gap: 6 (ref 5–15)
BUN: 15 mg/dL (ref 8–23)
CO2: 26 mmol/L (ref 22–32)
Calcium: 9.6 mg/dL (ref 8.9–10.3)
Chloride: 97 mmol/L — ABNORMAL LOW (ref 98–111)
Creatinine: 0.52 mg/dL (ref 0.44–1.00)
GFR, Estimated: >60 mL/min (ref >60)
Glucose, Bld: 89 mg/dL (ref 70–99)
Potassium: 3.9 mmol/L (ref 3.5–5.1)
Sodium: 129 mmol/L — ABNORMAL LOW (ref 135–145)
Total Bilirubin: 0.5 mg/dL (ref 0.3–1.2)
Total Protein: 7.1 g/dL (ref 6.5–8.1)

## 2021-12-17 LAB — LACTATE DEHYDROGENASE: LDH: 148 U/L (ref 98–192)

## 2021-12-17 MED ORDER — HEPARIN SOD (PORK) LOCK FLUSH 100 UNIT/ML IV SOLN
500.0000 [IU] | Freq: Once | INTRAVENOUS | Status: AC | PRN
  Administered 2021-12-17: 500 [IU]

## 2021-12-17 MED ORDER — SODIUM CHLORIDE 0.9% FLUSH
10.0000 mL | INTRAVENOUS | Status: DC | PRN
  Administered 2021-12-17: 10 mL

## 2021-12-17 MED ORDER — AMOXICILLIN 500 MG PO CAPS: 2000.0000 mg | ORAL_CAPSULE | Freq: Once | ORAL | 6 refills | Status: AC

## 2021-12-17 NOTE — Progress Notes (Signed)
Hematology and Oncology Follow Up Visit  [name] [id number] 02/15/1945 76 y.o. 12/17/2021   Principle Diagnosis:  Stage IIIA (T4N0M0) squamous cell carcinoma the right lower lung   Past Therapy: Patient completed definitive chemoradiation therapy with weekly carboplatinum /Taxol - completed on 07/16/2019   Current Therapy:        Durvalumab -- maintenance -- s/p cycle # 12 =--completed on 09/25/2020   Interim History:  [name] is here today for follow-up.  She continues to do quite nicely.  We did do a PET scan on her.  This was done on 12/12/2021.  The PET scan did not show any evidence of recurrent disease.  She has little bit of wheezing.  She think she does use inhalers.  She does have a good appetite.  There is no nausea or vomiting.  She has had no change in bowel or bladder habits.  She is trying to walk little bit more.  Hopefully with the Fall weather, she will to get out and her lungs not bother her.  She has had no problems with pain.  There is been no issues with leg swelling.  She has had no rashes.  She has had no headache.  Overall, I would say performance status is ECOG 1.    Medications:  Allergies as of 12/17/2021       Reactions   Bee Venom Swelling, Other (See Comments)   Timolol Nausea Only   Brimonidine Tartrate Other (See Comments)   Codeine Other (See Comments)   Other Other (See Comments)   Brimonidine Nausea Only   Hydrocodone Itching, Rash, Other (See Comments)        Medication List        Accurate as of December 17, 2021 11:04 AM. If you have any questions, ask your nurse or doctor.          STOP taking these medications    Comirnaty syringe Generic drug: COVID-19 mRNA vaccine 2023-2024 Stopped by: [name], MD   lidocaine 5 % Commonly known as: LIDODERM Stopped by: [name], MD       TAKE these medications    albuterol 108 (90 Base) MCG/ACT inhaler Commonly known as: VENTOLIN HFA   atorvastatin  20 MG tablet Commonly known as: LIPITOR Take 1 tablet (20 mg total) by mouth daily.   Benefiber Powd Take by mouth daily. 3 scoops into water daily per patient   BOOST HIGH PROTEIN PO Take by mouth daily.   diphenhydrAMINE 25 MG tablet Commonly known as: BENADRYL Take 50 mg by mouth daily as needed (allergic reaction).   dorzolamide 2 % ophthalmic solution Commonly known as: TRUSOPT Place 1 drop into both eyes 2 (two) times daily.   irbesartan 150 MG tablet Commonly known as: AVAPRO Take 150 mg by mouth daily.   levothyroxine 88 MCG tablet Commonly known as: SYNTHROID Take 88 mcg by mouth every morning.   lidocaine-prilocaine cream Commonly known as: EMLA   polyethylene glycol powder 17 GM/SCOOP powder Commonly known as: GLYCOLAX/MIRALAX Take by mouth daily as needed for mild constipation.   Spiriva Respimat 2.5 MCG/ACT Aers Generic drug: Tiotropium Bromide Monohydrate Inhale 1 puff into the lungs daily.   travoprost (benzalkonium) 0.004 % ophthalmic solution Commonly known as: TRAVATAN Place 1 drop into both eyes at bedtime.   Vitamin D 50 MCG (2000 UT) tablet Take 2,000 Units by mouth daily with supper.        Allergies:  Allergies  Allergen Reactions  Bee Venom Swelling and Other (See Comments)   Timolol Nausea Only   Brimonidine Tartrate Other (See Comments)   Codeine Other (See Comments)   Other Other (See Comments)   Brimonidine Nausea Only   Hydrocodone Itching, Rash and Other (See Comments)    Past Medical History, Surgical history, Social history, and Family History were reviewed and updated.  Review of Systems: Review of Systems  Constitutional: Negative.   Eyes: Negative.   Respiratory:  Positive for cough.   Cardiovascular: Negative.   Gastrointestinal: Negative.   Genitourinary: Negative.   Musculoskeletal: Negative.   Skin: Negative.   Neurological: Negative.   Endo/Heme/Allergies: Negative.   Psychiatric/Behavioral: Negative.        Physical Exam:  height is 4' 11.5" (1.511 m) and weight is 103 lb (46.7 kg). Her oral temperature is 98 F (36.7 C). Her blood pressure is 148/50 (abnormal) and her pulse is 73. Her respiration is 19 and oxygen saturation is 100%.   Wt Readings from Last 3 Encounters:  12/17/21 103 lb (46.7 kg)  12/12/21 102 lb (46.3 kg)  08/07/21 97 lb (44 kg)    Physical Exam Vitals reviewed.  HENT:     Head: Normocephalic and atraumatic.  Eyes:     Pupils: Pupils are equal, round, and reactive to light.  Cardiovascular:     Rate and Rhythm: Normal rate and regular rhythm.     Heart sounds: Normal heart sounds.  Pulmonary:     Effort: Pulmonary effort is normal.     Breath sounds: Normal breath sounds.  Abdominal:     General: Bowel sounds are normal.     Palpations: Abdomen is soft.  Musculoskeletal:        General: No tenderness or deformity. Normal range of motion.     Cervical back: Normal range of motion.  Lymphadenopathy:     Cervical: No cervical adenopathy.  Skin:    General: Skin is warm and dry.     Findings: No erythema or rash.  Neurological:     Mental Status: She is alert and oriented to person, place, and time.  Psychiatric:        Behavior: Behavior normal.        Thought Content: Thought content normal.        Judgment: Judgment normal.       Lab Results  Component Value Date   WBC 4.5 12/17/2021   HGB 11.7 (L) 12/17/2021   HCT 35.2 (L) 12/17/2021   MCV 91.7 12/17/2021   PLT 204 12/17/2021   Lab Results  Component Value Date   FERRITIN 311 (H) 05/25/2019   IRON 22 (L) 05/25/2019   TIBC 288 05/25/2019   UIBC 265 05/25/2019   IRONPCTSAT 8 (L) 05/25/2019   Lab Results  Component Value Date   RBC 3.84 (L) 12/17/2021   No results found for: "KPAFRELGTCHN", "LAMBDASER", "KAPLAMBRATIO" No results found for: "IGGSERUM", "IGA", "IGMSERUM" No results found for: "TOTALPROTELP", "ALBUMINELP", "A1GS", "A2GS", "BETS", "BETA2SER", "GAMS", "MSPIKE", "SPEI"    Chemistry      Component Value Date/Time   NA 129 (L) 12/17/2021 0941   K 3.9 12/17/2021 0941   CL 97 (L) 12/17/2021 0941   CO2 26 12/17/2021 0941   BUN 15 12/17/2021 0941   CREATININE 0.52 12/17/2021 0941      Component Value Date/Time   CALCIUM 9.6 12/17/2021 0941   ALKPHOS 75 12/17/2021 0941   AST 10 (L) 12/17/2021 0941   ALT 15 12/17/2021 0941   BILITOT  0.5 12/17/2021 0941       Impression and Plan: Ms. Kable is a very nice 76 yo caucasian female with locally advanced stage IIIa non-small cell carcinoma the right lower lung.      She received chemotherapy and radiation therapy with a very nice response.  We then went ahead and treated her with immunotherapy with Durvalumab.  We will do a follow-up PET scan in the Spring.  I am just glad that her quality life is doing so well right now.  With the cooler weather, this will certainly help her lungs.  I think we can now get her through the Winter.  We will get her back in the Spring right after her PET scan is done.    She still has her Port-A-Cath in.  We will flushes every 2 months.   [name], MD 11/6/202311:04 AM

## 2021-12-17 NOTE — Addendum Note (Signed)
Addended by: Burney Gauze R on: 12/17/2021 11:24 AM   Modules accepted: Orders

## 2021-12-17 NOTE — Patient Instructions (Signed)

## 2021-12-26 DIAGNOSIS — Z961 Presence of intraocular lens: Secondary | ICD-10-CM | POA: Diagnosis not present

## 2021-12-26 DIAGNOSIS — H0100A Unspecified blepharitis right eye, upper and lower eyelids: Secondary | ICD-10-CM | POA: Diagnosis not present

## 2021-12-26 DIAGNOSIS — H401131 Primary open-angle glaucoma, bilateral, mild stage: Secondary | ICD-10-CM | POA: Diagnosis not present

## 2021-12-26 DIAGNOSIS — H0100B Unspecified blepharitis left eye, upper and lower eyelids: Secondary | ICD-10-CM | POA: Diagnosis not present

## 2022-01-07 ENCOUNTER — Ambulatory Visit (HOSPITAL_BASED_OUTPATIENT_CLINIC_OR_DEPARTMENT_OTHER): Payer: Medicare Other | Admitting: Cardiology

## 2022-01-07 ENCOUNTER — Encounter (HOSPITAL_BASED_OUTPATIENT_CLINIC_OR_DEPARTMENT_OTHER): Payer: Self-pay | Admitting: Cardiology

## 2022-01-07 VITALS — BP 134/66 | HR 67 | Ht 59.5 in | Wt 106.0 lb

## 2022-01-07 DIAGNOSIS — R931 Abnormal findings on diagnostic imaging of heart and coronary circulation: Secondary | ICD-10-CM

## 2022-01-07 DIAGNOSIS — Z7189 Other specified counseling: Secondary | ICD-10-CM | POA: Diagnosis not present

## 2022-01-07 DIAGNOSIS — R9431 Abnormal electrocardiogram [ECG] [EKG]: Secondary | ICD-10-CM

## 2022-01-07 DIAGNOSIS — I700 Atherosclerosis of aorta: Secondary | ICD-10-CM

## 2022-01-07 NOTE — Patient Instructions (Signed)
Medication Instructions:  The current medical regimen is effective;  continue present plan and medications.   *If you need a refill on your cardiac medications before your next appointment, please call your pharmacy*   Lab Work: None   Testing/Procedures:  None  Follow-Up: At [facility] you and your health needs are our priority.  As part of our continuing mission to provide you with exceptional heart care, we have created designated Provider Care Teams.  These Care Teams include your primary Cardiologist (physician) and Advanced Practice Providers (APPs -  Physician Assistants and Nurse Practitioners) who all work together to provide you with the care you need, when you need it.  We recommend signing up for the patient portal called "MyChart".  Sign up information is provided on this After Visit Summary.  MyChart is used to connect with patients for Virtual Visits (Telemedicine).  Patients are able to view lab/test results, encounter notes, upcoming appointments, etc.  Non-urgent messages can be sent to your provider as well.   To learn more about what you can do with MyChart, go to [email or web address].    Your next appointment:   12 month(s)  The format for your next appointment:   In Person  Provider:   Buford Dresser, MD    Other Instructions None

## 2022-01-07 NOTE — Progress Notes (Signed)
Cardiology Office Note:    Date:  01/07/2022   ID:  [name], DOB 02/17/1945, MRN [id number]  PCP:  Ross, Charles Alan, MD  Cardiologist:  Bridgette Christopher, MD  Referring MD: Ross, Charles Alan, MD   CC: follow up  History of Present Illness:    [name] is a 76 y.o. female with a hx of stage 3 NSCLC s/p chemo and radiation, COPD, hyponatremia, hypothyroidism who is seen for follow up today. I met her 06/04/21 during her hospitalization for COPD exacerbation.  Reviewed her hospitalization, discharged 06/05/21. hsTn 180 > 171, echo with EF 55-60% with apical hypokinesis suggestive of takotsubo cardiomyopathy.  At her last visit her breathing was improving and she was slowly increasing her activity level. We discussed options, including further testing. Also discussed medication management. She prefers no medications but after speaking to her daughter she agreed to start medications and was tolerating well.    Today, she states she has been feeling fine with no cardiovascular concerns. In the mornings after drinking coffee she experiences a little congestion and cough. This usually resolves by later in the day.   When she becomes excited she will notice some exacerbation of her shortness of breath/COPD.  On some days she may be able to walk further than on other days. Her typical range of walking distances has been stable.  She denies any palpitations, chest pain, or peripheral edema. No lightheadedness, headaches, syncope, orthopnea, or PND.  Due to feeling soreness of her left ear, she asked if I would examine her ear which appears erythematous.   Past Medical History:  Diagnosis Date   Dyspnea    Glaucoma    Headache    Hypothyroidism    Right lower lobe lung mass    Thyroid condition     Past Surgical History:  Procedure Laterality Date   BIOPSY  05/11/2019   Procedure: BIOPSY;  Surgeon: Olalere, Adewale A, MD;  Location: [facility];  Service:  Pulmonary;;   BRONCHIAL WASHINGS  05/11/2019   Procedure: BRONCHIAL WASHINGS;  Surgeon: Olalere, Adewale A, MD;  Location: [facility];  Service: Pulmonary;;   CATARACT EXTRACTION, BILATERAL     1 was a lens implant   COLONOSCOPY     FINE NEEDLE ASPIRATION  05/11/2019   Procedure: FINE NEEDLE ASPIRATION (FNA) LINEAR;  Surgeon: Olalere, Adewale A, MD;  Location: [facility];  Service: Pulmonary;;   IR IMAGING GUIDED PORT INSERTION  05/27/2019   MEDIASTINOSCOPY N/A 05/21/2019   Procedure: MEDIASTINOSCOPY;  Surgeon: Gerhardt, Edward B, MD;  Location: [facility] OR;  Service: Thoracic;  Laterality: N/A;   SHOULDER SURGERY     VIDEO BRONCHOSCOPY WITH ENDOBRONCHIAL ULTRASOUND N/A 05/11/2019   Procedure: VIDEO BRONCHOSCOPY WITH ENDOBRONCHIAL ULTRASOUND;  Surgeon: Olalere, Adewale A, MD;  Location: [facility];  Service: Pulmonary;  Laterality: N/A;   VIDEO BRONCHOSCOPY WITH ENDOBRONCHIAL ULTRASOUND N/A 05/21/2019   Procedure: VIDEO BRONCHOSCOPY WITH ENDOBRONCHIAL ULTRASOUND;  Surgeon: Gerhardt, Edward B, MD;  Location: [facility] OR;  Service: Thoracic;  Laterality: N/A;   vocal cord polyp removal     WISDOM TOOTH EXTRACTION      Current Medications: Current Outpatient Medications on File Prior to Visit  Medication Sig   albuterol (VENTOLIN HFA) 108 (90 Base) MCG/ACT inhaler    atorvastatin (LIPITOR) 20 MG tablet Take 1 tablet (20 mg total) by mouth daily.   Cholecalciferol (VITAMIN D) 50 MCG (2000 UT) tablet Take 2,000 Units by mouth daily with supper.   diphenhydrAMINE (BENADRYL) 25   MG tablet Take 50 mg by mouth daily as needed (allergic reaction).   dorzolamide (TRUSOPT) 2 % ophthalmic solution Place 1 drop into both eyes 2 (two) times daily.    irbesartan (AVAPRO) 150 MG tablet Take 150 mg by mouth daily.   levothyroxine (SYNTHROID) 88 MCG tablet Take 88 mcg by mouth every morning.   lidocaine-prilocaine (EMLA) cream    Nutritional Supplements (BOOST HIGH PROTEIN PO) Take by mouth daily.   polyethylene  glycol powder (GLYCOLAX/MIRALAX) 17 GM/SCOOP powder Take by mouth daily as needed for mild constipation.   SPIRIVA RESPIMAT 2.5 MCG/ACT AERS Inhale 1 puff into the lungs daily.   travoprost, benzalkonium, (TRAVATAN) 0.004 % ophthalmic solution Place 1 drop into both eyes at bedtime.   Wheat Dextrin (BENEFIBER) POWD Take by mouth daily. 3 scoops into water daily per patient   Current Facility-Administered Medications on File Prior to Visit  Medication   sodium chloride flush (NS) 0.9 % injection 10 mL     Allergies:   Bee venom, Timolol, Brimonidine tartrate, Codeine, Other, Brimonidine, and Hydrocodone   Social History   Tobacco Use   Smoking status: Former    Packs/day: 1.00    Years: 40.00    Total pack years: 40.00    Types: Cigarettes    Quit date: 02/13/2010    Years since quitting: 11.9   Smokeless tobacco: Never   Tobacco comments:    Quit in 2011  Vaping Use   Vaping Use: Former   Devices: used 2-3 weeks while she was quitting cigarettes  Substance Use Topics   Alcohol use: Yes    Comment: very rare, 1 glass of wine maybe every 3-4 weeks   Drug use: No    Family History: family history includes CAD in her father; Heart failure in her father.  ROS:   Please see the history of present illness.   (+) Congestion (+) Cough (+) Occasional shortness of breath Additional pertinent ROS otherwise unremarkable.  EKGs/Labs/Other Studies Reviewed:    The following studies were reviewed today:  Echo: 06/02/21  1. Left ventricular ejection fraction, by estimation, is 55 to 60%. Left  ventricular ejection fraction by 2D MOD biplane is 57.3 %. The left  ventricle has normal function. The left ventricle demonstrates regional  wall motion abnormalities (see scoring  diagram/findings for description). Left ventricular diastolic parameters  are consistent with Grade I diastolic dysfunction (impaired relaxation).  There is moderate hypokinesis of the left ventricular, apical  apical  segment, inferior segment and anterior  segment.   2. Right ventricular systolic function is normal. The right ventricular  size is normal. There is normal pulmonary artery systolic pressure. The  estimated right ventricular systolic pressure is 32.6 mmHg.   3. The mitral valve is grossly normal. Trivial mitral valve  regurgitation.   4. The aortic valve is tricuspid. Aortic valve regurgitation is not  visualized. Aortic valve sclerosis is present, with no evidence of aortic  valve stenosis.   5. The inferior vena cava is normal in size with greater than 50%  respiratory variability, suggesting right atrial pressure of 3 mmHg.   Comparison(s): No prior Echocardiogram.   Conclusion(s)/Recommendation(s): Findings suggestive of possibe Takatsubo  cardiomyoapthy or less likely distal LAD territory ischemia, especially  given the relatively low and flat troponin elevation.   CTA Chest  06/01/2021: IMPRESSION: No evidence of pulmonary embolism or other acute findings.   Stable right lung mass in the superior segment of the right lower lobe. No new or   progressive disease identified.   Stable small right pleural effusion.   Stable subacute fractures of right posterolateral 8th and 9th ribs.   Aortic Atherosclerosis (ICD10-I70.0) and Emphysema (ICD10-J43.9).  EKG:  EKG is personally reviewed.   01/07/2022:  not ordered today 07/04/21: not ordered  Recent Labs: 06/01/2021: B Natriuretic Peptide 869.6; TSH 0.685 12/17/2021: ALT 15; BUN 15; Creatinine 0.52; Hemoglobin 11.7; Platelet Count 204; Potassium 3.9; Sodium 129   Recent Lipid Panel    Component Value Date/Time   CHOL 121 06/05/2021 0540   TRIG 37 06/05/2021 0540   HDL 61 06/05/2021 0540   CHOLHDL 2.0 06/05/2021 0540   VLDL 7 06/05/2021 0540   LDLCALC 53 06/05/2021 0540    Physical Exam:    VS:  BP 134/66 (BP Location: Left Arm, Patient Position: Sitting, Cuff Size: Normal)   Pulse 67   Ht 4' 11.5" (1.511 m)    Wt 106 lb (48.1 kg)   SpO2 98%   BMI 21.05 kg/m     Wt Readings from Last 3 Encounters:  01/07/22 106 lb (48.1 kg)  12/17/21 103 lb (46.7 kg)  12/12/21 102 lb (46.3 kg)    GEN: Well nourished, well developed in no acute distress HEENT: Normal, moist mucous membranes; slight inner left ear erythema NECK: No JVD CARDIAC: regular rhythm, normal S1 and S2, no rubs or gallops. No murmur. VASCULAR: Radial and DP pulses 2+ bilaterally. No carotid bruits RESPIRATORY:  Clear to auscultation without rales, wheezing or rhonchi  ABDOMEN: Soft, non-tender, non-distended MUSCULOSKELETAL:  Ambulates independently SKIN: Warm and dry, no edema NEUROLOGIC:  Alert and oriented x 3. No focal neuro deficits noted. PSYCHIATRIC:  Normal affect    ASSESSMENT:    1. Abnormal echocardiogram   2. Abnormal ECG   3. Aortic atherosclerosis (HCC)   4. Counseling on health promotion and disease prevention     PLAN:    Elevated troponin during hospitalization Preserved LVEF with mild hypokinesis at apex/abnormal echo Diffuse TWI/abnormal ECG Aortic atherosclerosis -overall she has no symptoms. We have previously discussed further evaluation, and we both agree that it is reasonable to hold on this as she is asymptomatic -tolerating irbesartan -declines beta blocker given COPD -continue aspirin and statin -counseled on red flag warning signs that need immediate medical attention  Cardiac risk counseling and prevention recommendations: -recommend heart healthy/Mediterranean diet, with whole grains, fruits, vegetable, fish, lean meats, nuts, and olive oil. Limit salt. -recommend moderate walking, 3-5 times/week for 30-50 minutes each session. Aim for at least 150 minutes.week. Goal should be pace of 3 miles/hours, or walking 1.5 miles in 30 minutes -recommend avoidance of tobacco products. Avoid excess alcohol.  Plan for follow up: 1 year or sooner as needed.  Bridgette Christopher, MD, PhD, FACC [facility]    Medication Adjustments/Labs and Tests Ordered: Current medicines are reviewed at length with the patient today.  Concerns regarding medicines are outlined above.   No orders of the defined types were placed in this encounter.  No orders of the defined types were placed in this encounter.  Patient Instructions  Medication Instructions:  The current medical regimen is effective;  continue present plan and medications.   *If you need a refill on your cardiac medications before your next appointment, please call your pharmacy*   Lab Work: None   Testing/Procedures:  None  Follow-Up: At [facility], you and your health needs are our priority.  As part of our continuing mission to provide you   with exceptional heart care, we have created designated Provider Care Teams.  These Care Teams include your primary Cardiologist (physician) and Advanced Practice Providers (APPs -  Physician Assistants and Nurse Practitioners) who all work together to provide you with the care you need, when you need it.  We recommend signing up for the patient portal called "MyChart".  Sign up information is provided on this After Visit Summary.  MyChart is used to connect with patients for Virtual Visits (Telemedicine).  Patients are able to view lab/test results, encounter notes, upcoming appointments, etc.  Non-urgent messages can be sent to your provider as well.   To learn more about what you can do with MyChart, go to [email or web address].    Your next appointment:   12 month(s)  The format for your next appointment:   In Person  Provider:   Bridgette Christopher, MD    Other Instructions None           I,Mathew Stumpf,acting as a scribe for Bridgette Christopher, MD.,have documented all relevant documentation on the behalf of Bridgette Christopher, MD,as directed by  Bridgette Christopher, MD while in the presence of Bridgette Christopher, MD.  I,  Bridgette Christopher, MD, have reviewed all documentation for this visit. The documentation on 01/07/22 for the exam, diagnosis, procedures, and orders are all accurate and complete.   Signed, Bridgette Christopher, MD PhD 01/07/2022 1:05 PM    [facility]  

## 2022-01-11 DIAGNOSIS — L578 Other skin changes due to chronic exposure to nonionizing radiation: Secondary | ICD-10-CM | POA: Diagnosis not present

## 2022-01-11 DIAGNOSIS — D225 Melanocytic nevi of trunk: Secondary | ICD-10-CM | POA: Diagnosis not present

## 2022-01-11 DIAGNOSIS — L821 Other seborrheic keratosis: Secondary | ICD-10-CM | POA: Diagnosis not present

## 2022-01-11 DIAGNOSIS — L814 Other melanin hyperpigmentation: Secondary | ICD-10-CM | POA: Diagnosis not present

## 2022-02-11 ENCOUNTER — Encounter: Payer: Self-pay | Admitting: Hematology

## 2022-02-18 ENCOUNTER — Inpatient Hospital Stay: Payer: BC Managed Care – PPO | Attending: Hematology

## 2022-02-18 DIAGNOSIS — Z452 Encounter for adjustment and management of vascular access device: Secondary | ICD-10-CM | POA: Diagnosis not present

## 2022-02-18 DIAGNOSIS — C3431 Malignant neoplasm of lower lobe, right bronchus or lung: Secondary | ICD-10-CM | POA: Diagnosis not present

## 2022-02-18 DIAGNOSIS — Z95828 Presence of other vascular implants and grafts: Secondary | ICD-10-CM

## 2022-02-18 MED ORDER — SODIUM CHLORIDE 0.9% FLUSH
10.0000 mL | Freq: Once | INTRAVENOUS | Status: AC
  Administered 2022-02-18: 10 mL via INTRAVENOUS

## 2022-02-18 MED ORDER — HEPARIN SOD (PORK) LOCK FLUSH 100 UNIT/ML IV SOLN
500.0000 [IU] | Freq: Once | INTRAVENOUS | Status: AC
  Administered 2022-02-18: 500 [IU] via INTRAVENOUS

## 2022-03-12 DIAGNOSIS — K080 Exfoliation of teeth due to systemic causes: Secondary | ICD-10-CM | POA: Diagnosis not present

## 2022-03-16 ENCOUNTER — Encounter: Payer: Self-pay | Admitting: Hematology

## 2022-04-03 DIAGNOSIS — I700 Atherosclerosis of aorta: Secondary | ICD-10-CM | POA: Diagnosis not present

## 2022-04-03 DIAGNOSIS — R252 Cramp and spasm: Secondary | ICD-10-CM | POA: Diagnosis not present

## 2022-04-03 DIAGNOSIS — Z6822 Body mass index (BMI) 22.0-22.9, adult: Secondary | ICD-10-CM | POA: Diagnosis not present

## 2022-04-03 DIAGNOSIS — J439 Emphysema, unspecified: Secondary | ICD-10-CM | POA: Diagnosis not present

## 2022-04-08 ENCOUNTER — Encounter: Payer: Self-pay | Admitting: Hematology

## 2022-04-15 ENCOUNTER — Inpatient Hospital Stay: Payer: Medicare Other | Attending: Hematology

## 2022-04-15 VITALS — BP 153/68 | HR 71 | Temp 98.0°F | Resp 18

## 2022-04-15 DIAGNOSIS — C3431 Malignant neoplasm of lower lobe, right bronchus or lung: Secondary | ICD-10-CM | POA: Insufficient documentation

## 2022-04-15 DIAGNOSIS — Z452 Encounter for adjustment and management of vascular access device: Secondary | ICD-10-CM | POA: Diagnosis not present

## 2022-04-15 DIAGNOSIS — Z95828 Presence of other vascular implants and grafts: Secondary | ICD-10-CM

## 2022-04-15 MED ORDER — SODIUM CHLORIDE 0.9% FLUSH
10.0000 mL | Freq: Once | INTRAVENOUS | Status: AC
  Administered 2022-04-15: 10 mL via INTRAVENOUS

## 2022-04-15 MED ORDER — HEPARIN SOD (PORK) LOCK FLUSH 100 UNIT/ML IV SOLN
500.0000 [IU] | Freq: Once | INTRAVENOUS | Status: AC
  Administered 2022-04-15: 500 [IU] via INTRAVENOUS

## 2022-04-27 DIAGNOSIS — S52592A Other fractures of lower end of left radius, initial encounter for closed fracture: Secondary | ICD-10-CM | POA: Diagnosis not present

## 2022-05-01 DIAGNOSIS — S52532A Colles' fracture of left radius, initial encounter for closed fracture: Secondary | ICD-10-CM | POA: Diagnosis not present

## 2022-05-15 DIAGNOSIS — S52532A Colles' fracture of left radius, initial encounter for closed fracture: Secondary | ICD-10-CM | POA: Diagnosis not present

## 2022-05-27 ENCOUNTER — Inpatient Hospital Stay: Payer: Medicare Other | Admitting: Medical Oncology

## 2022-05-27 ENCOUNTER — Inpatient Hospital Stay: Payer: Medicare Other | Attending: Hematology

## 2022-05-27 ENCOUNTER — Inpatient Hospital Stay: Payer: Medicare Other

## 2022-05-27 ENCOUNTER — Encounter: Payer: Self-pay | Admitting: Medical Oncology

## 2022-05-27 ENCOUNTER — Other Ambulatory Visit: Payer: Self-pay

## 2022-05-27 VITALS — BP 108/50 | HR 75 | Temp 98.2°F | Resp 18 | Ht 59.5 in | Wt 103.0 lb

## 2022-05-27 DIAGNOSIS — C3431 Malignant neoplasm of lower lobe, right bronchus or lung: Secondary | ICD-10-CM | POA: Insufficient documentation

## 2022-05-27 DIAGNOSIS — Z923 Personal history of irradiation: Secondary | ICD-10-CM | POA: Diagnosis not present

## 2022-05-27 DIAGNOSIS — E440 Moderate protein-calorie malnutrition: Secondary | ICD-10-CM | POA: Diagnosis not present

## 2022-05-27 DIAGNOSIS — Z9221 Personal history of antineoplastic chemotherapy: Secondary | ICD-10-CM | POA: Insufficient documentation

## 2022-05-27 DIAGNOSIS — C3491 Malignant neoplasm of unspecified part of right bronchus or lung: Secondary | ICD-10-CM | POA: Diagnosis not present

## 2022-05-27 DIAGNOSIS — Z95828 Presence of other vascular implants and grafts: Secondary | ICD-10-CM

## 2022-05-27 DIAGNOSIS — C3401 Malignant neoplasm of right main bronchus: Secondary | ICD-10-CM

## 2022-05-27 LAB — CMP (CANCER CENTER ONLY)
ALT: 14 U/L (ref 0–44)
AST: 9 U/L — ABNORMAL LOW (ref 15–41)
Albumin: 4.2 g/dL (ref 3.5–5.0)
Alkaline Phosphatase: 80 U/L (ref 38–126)
Anion gap: 7 (ref 5–15)
BUN: 13 mg/dL (ref 8–23)
CO2: 26 mmol/L (ref 22–32)
Calcium: 9.3 mg/dL (ref 8.9–10.3)
Chloride: 96 mmol/L — ABNORMAL LOW (ref 98–111)
Creatinine: 0.54 mg/dL (ref 0.44–1.00)
GFR, Estimated: >60 mL/min (ref >60)
Glucose, Bld: 105 mg/dL — ABNORMAL HIGH (ref 70–99)
Potassium: 4 mmol/L (ref 3.5–5.1)
Sodium: 129 mmol/L — ABNORMAL LOW (ref 135–145)
Total Bilirubin: 0.4 mg/dL (ref 0.3–1.2)
Total Protein: 6.9 g/dL (ref 6.5–8.1)

## 2022-05-27 LAB — CBC WITH DIFFERENTIAL (CANCER CENTER ONLY)
Abs Immature Granulocytes: 0.02 10*3/uL (ref 0.00–0.07)
Basophils Absolute: 0 10*3/uL (ref 0.0–0.1)
Basophils Relative: 1 %
Eosinophils Absolute: 0.2 10*3/uL (ref 0.0–0.5)
Eosinophils Relative: 3 %
HCT: 34.1 % — ABNORMAL LOW (ref 36.0–46.0)
Hemoglobin: 11.6 g/dL — ABNORMAL LOW (ref 12.0–15.0)
Immature Granulocytes: 0 %
Lymphocytes Relative: 16 %
Lymphs Abs: 0.8 10*3/uL (ref 0.7–4.0)
MCH: 31.6 pg (ref 26.0–34.0)
MCHC: 34 g/dL (ref 30.0–36.0)
MCV: 92.9 fL (ref 80.0–100.0)
Monocytes Absolute: 0.6 10*3/uL (ref 0.1–1.0)
Monocytes Relative: 11 %
Neutro Abs: 3.5 10*3/uL (ref 1.7–7.7)
Neutrophils Relative %: 69 %
Platelet Count: 210 10*3/uL (ref 150–400)
RBC: 3.67 MIL/uL — ABNORMAL LOW (ref 3.87–5.11)
RDW: 13.1 % (ref 11.5–15.5)
WBC Count: 5.2 10*3/uL (ref 4.0–10.5)
nRBC: 0 % (ref 0.0–0.2)

## 2022-05-27 LAB — LACTATE DEHYDROGENASE: LDH: 174 U/L (ref 98–192)

## 2022-05-27 MED ORDER — SODIUM CHLORIDE 0.9% FLUSH
10.0000 mL | Freq: Once | INTRAVENOUS | Status: AC
  Administered 2022-05-27: 10 mL via INTRAVENOUS

## 2022-05-27 MED ORDER — HEPARIN SOD (PORK) LOCK FLUSH 100 UNIT/ML IV SOLN
500.0000 [IU] | Freq: Once | INTRAVENOUS | Status: AC
  Administered 2022-05-27: 500 [IU] via INTRAVENOUS

## 2022-05-27 MED ORDER — AMOXICILLIN 500 MG PO CAPS: 2000.0000 mg | ORAL_CAPSULE | Freq: Once | ORAL | 0 refills | Status: AC

## 2022-05-27 NOTE — Progress Notes (Signed)
Hematology and Oncology Follow Up Visit  [name] [id number] 03/11/1945 77 y.o. 05/27/2022   Principle Diagnosis:  Stage IIIA (T4N0M0) squamous cell carcinoma the right lower lung   Past Therapy: Patient completed definitive chemoradiation therapy with weekly carboplatinum /Taxol - completed on 07/16/2019   Current Therapy:        Durvalumab -- maintenance -- s/p cycle # 12 =--completed on 09/25/2020   Interim History:  [name] is here today for follow-up.  She continues to do quite nicely.  Her last PET scan was on 12/12/2021.  The PET scan did not show any evidence of recurrent disease.  She has little bit of chronic wheezing with the pollen. No hemoptysis, chest pain, new SOB. No night sweats.   The only problem she has had since our last visit is that she broke her left arm while trying to help her 90+ pound dog. She reports that she has not started bisphosphonate medication as she is currently having dental procedures performed periodically over the next few months. She also asks for a refill of her amoxil which she takes prior to dental procedures due to her port-a-cath.   She does have a good appetite.  There is no nausea or vomiting.  She has had no change in bowel or bladder habits.  She has had no problems with pain.  There is been no issues with leg swelling.  She has had no rashes.  She has had no headache.  Overall, I would say performance status is ECOG 1.    Wt Readings from Last 3 Encounters:  05/27/22 103 lb (46.7 kg)  01/07/22 106 lb (48.1 kg)  12/17/21 103 lb (46.7 kg)    Medications:  Allergies as of 05/27/2022       Reactions   Bee Venom Swelling, Other (See Comments)   Brimonidine Tartrate Nausea Only   Timolol Nausea Only   Hydrocodone Bitartrate Er Itching, Rash   Brimonidine Nausea Only   Codeine Other (See Comments)   Unknown Reaction   Hydrocodone Itching, Rash, Other (See Comments)        Medication List        Accurate as of  May 27, 2022 11:15 AM. If you have any questions, ask your nurse or doctor.          albuterol 108 (90 Base) MCG/ACT inhaler Commonly known as: VENTOLIN HFA   amoxicillin 500 MG capsule Commonly known as: AMOXIL Take 4 capsules (2,000 mg total) by mouth once for 1 dose. Take one hour before dental appointment Started by: Rushie Chestnut, PA-C   ASPIRIN LOW DOSE 81 MG tablet Generic drug: aspirin EC Take 81 mg by mouth daily.   atorvastatin 20 MG tablet Commonly known as: LIPITOR Take 1 tablet (20 mg total) by mouth daily.   Benefiber Powd Take by mouth daily. 3 scoops into water daily per patient   BOOST HIGH PROTEIN PO Take by mouth daily.   diphenhydrAMINE 25 MG tablet Commonly known as: BENADRYL Take 50 mg by mouth daily as needed (allergic reaction).   dorzolamide 2 % ophthalmic solution Commonly known as: TRUSOPT Place 1 drop into both eyes 2 (two) times daily.   irbesartan 150 MG tablet Commonly known as: AVAPRO Take 150 mg by mouth daily.   levothyroxine 88 MCG tablet Commonly known as: SYNTHROID Take 88 mcg by mouth every morning.   lidocaine-prilocaine cream Commonly known as: EMLA   polyethylene glycol powder 17 GM/SCOOP powder Commonly known as: GLYCOLAX/MIRALAX  Take by mouth daily as needed for mild constipation.   Spiriva Respimat 2.5 MCG/ACT Aers Generic drug: Tiotropium Bromide Monohydrate Inhale 1 puff into the lungs daily.   travoprost (benzalkonium) 0.004 % ophthalmic solution Commonly known as: TRAVATAN Place 1 drop into both eyes at bedtime.   Vitamin D 50 MCG (2000 UT) tablet Take 2,000 Units by mouth daily with supper.        Allergies:  Allergies  Allergen Reactions   Bee Venom Swelling and Other (See Comments)   Brimonidine Tartrate Nausea Only   Timolol Nausea Only   Hydrocodone Bitartrate Er Itching and Rash   Brimonidine Nausea Only   Codeine Other (See Comments)    Unknown Reaction   Hydrocodone Itching, Rash  and Other (See Comments)    Past Medical History, Surgical history, Social history, and Family History were reviewed and updated.  Review of Systems: Review of Systems  Constitutional: Negative.   Eyes: Negative.   Respiratory:  Positive for cough.   Cardiovascular: Negative.   Gastrointestinal: Negative.   Genitourinary: Negative.   Musculoskeletal: Negative.   Skin: Negative.   Neurological: Negative.   Endo/Heme/Allergies: Negative.   Psychiatric/Behavioral: Negative.       Physical Exam:  height is 4' 11.5" (1.511 m) and weight is 103 lb (46.7 kg). Her oral temperature is 98.2 F (36.8 C). Her blood pressure is 108/50 (abnormal) and her pulse is 75. Her respiration is 18 and oxygen saturation is 100%.   Wt Readings from Last 3 Encounters:  05/27/22 103 lb (46.7 kg)  01/07/22 106 lb (48.1 kg)  12/17/21 103 lb (46.7 kg)    Physical Exam Vitals reviewed.  HENT:     Head: Normocephalic and atraumatic.  Eyes:     Pupils: Pupils are equal, round, and reactive to light.  Cardiovascular:     Rate and Rhythm: Normal rate and regular rhythm.     Heart sounds: Normal heart sounds.  Pulmonary:     Effort: Pulmonary effort is normal.     Breath sounds: Normal breath sounds.  Musculoskeletal:        General: No tenderness or deformity. Normal range of motion.     Cervical back: Normal range of motion.  Lymphadenopathy:     Cervical: No cervical adenopathy.  Skin:    General: Skin is warm and dry.     Findings: No erythema or rash.  Neurological:     Mental Status: She is alert and oriented to person, place, and time.  Psychiatric:        Behavior: Behavior normal.        Thought Content: Thought content normal.        Judgment: Judgment normal.       Lab Results  Component Value Date   WBC 5.2 05/27/2022   HGB 11.6 (L) 05/27/2022   HCT 34.1 (L) 05/27/2022   MCV 92.9 05/27/2022   PLT 210 05/27/2022   Lab Results  Component Value Date   FERRITIN 311 (H)  05/25/2019   IRON 22 (L) 05/25/2019   TIBC 288 05/25/2019   UIBC 265 05/25/2019   IRONPCTSAT 8 (L) 05/25/2019   Lab Results  Component Value Date   RBC 3.67 (L) 05/27/2022   No results found for: "KPAFRELGTCHN", "LAMBDASER", "KAPLAMBRATIO" No results found for: "IGGSERUM", "IGA", "IGMSERUM" No results found for: "TOTALPROTELP", "ALBUMINELP", "A1GS", "A2GS", "BETS", "BETA2SER", "GAMS", "MSPIKE", "SPEI"   Chemistry      Component Value Date/Time   NA 129 (L) 05/27/2022 1028  K 4.0 05/27/2022 1028   CL 96 (L) 05/27/2022 1028   CO2 26 05/27/2022 1028   BUN 13 05/27/2022 1028   CREATININE 0.54 05/27/2022 1028      Component Value Date/Time   CALCIUM 9.3 05/27/2022 1028   ALKPHOS 80 05/27/2022 1028   AST 9 (L) 05/27/2022 1028   ALT 14 05/27/2022 1028   BILITOT 0.4 05/27/2022 1028     Impression and Plan: Ms. Fenley is a very nice 77 yo caucasian female with locally advanced stage IIIa non-small cell carcinoma the right lower lung.      She received chemotherapy, radiation therapy, and immunotherapy with Durvalumab.  She is due for a repeat PET scan. This has been ordered but needs to be scheduled. I will work on this today.   Overall she continues to do well with no evidence of recurrence. Her weight waxes and wanes around 103-106. Stable.   She will continue to work with her dentist and periodontist. I have refilled her Amoxil.   Hemoglobin is 11.6 today which is stable. We will continue to monitor.   She still has her Port-A-Cath in.  We will flushes every 2 months.  Disposition: PET scan to be scheduled- order placed by Dr. Rexene Edison previously  RTC 2 months port flush only RTC 4 months MD, port labs -[place]  Rushie Chestnut, [place] 4/15/202411:15 AM

## 2022-05-27 NOTE — Patient Instructions (Signed)

## 2022-06-10 DIAGNOSIS — S52532A Colles' fracture of left radius, initial encounter for closed fracture: Secondary | ICD-10-CM | POA: Diagnosis not present

## 2022-06-14 ENCOUNTER — Encounter (HOSPITAL_COMMUNITY)
Admission: RE | Admit: 2022-06-14 | Discharge: 2022-06-14 | Disposition: A | Discharge status: Home / Self Care | Payer: Medicare Other | Source: Ambulatory Visit | Attending: Hematology & Oncology | Admitting: Hematology & Oncology

## 2022-06-14 DIAGNOSIS — C3401 Malignant neoplasm of right main bronchus: Secondary | ICD-10-CM | POA: Insufficient documentation

## 2022-06-14 DIAGNOSIS — C3432 Malignant neoplasm of lower lobe, left bronchus or lung: Secondary | ICD-10-CM | POA: Diagnosis not present

## 2022-06-14 LAB — GLUCOSE, CAPILLARY: Glucose-Capillary: 80 mg/dL (ref 70–99)

## 2022-06-14 MED ORDER — FLUDEOXYGLUCOSE F - 18 (FDG) INJECTION: 5.2000 | Freq: Once | INTRAVENOUS | Status: DC

## 2022-06-14 MED ORDER — FLUDEOXYGLUCOSE F - 18 (FDG) INJECTION
5.2000 | Freq: Once | INTRAVENOUS | Status: AC
  Administered 2022-06-14: 5.14 via INTRAVENOUS

## 2022-06-19 ENCOUNTER — Telehealth: Payer: Self-pay | Admitting: *Deleted

## 2022-06-19 NOTE — Telephone Encounter (Signed)
-----   Message from Josph Macho, MD sent at 06/18/2022  8:46 PM EDT ----- Call - the PET scan is normal!!!   NO cancer!!   Happy Mother's Day!!   Cindee Lame

## 2022-06-19 NOTE — Telephone Encounter (Signed)
Called pt with results. Pt verbalized understanding. No concerns at this time. 

## 2022-06-25 DIAGNOSIS — S52532A Colles' fracture of left radius, initial encounter for closed fracture: Secondary | ICD-10-CM | POA: Diagnosis not present

## 2022-07-02 DIAGNOSIS — H401131 Primary open-angle glaucoma, bilateral, mild stage: Secondary | ICD-10-CM | POA: Diagnosis not present

## 2022-07-29 ENCOUNTER — Inpatient Hospital Stay: Payer: Medicare Other | Attending: Hematology

## 2022-07-29 DIAGNOSIS — C3431 Malignant neoplasm of lower lobe, right bronchus or lung: Secondary | ICD-10-CM | POA: Insufficient documentation

## 2022-07-29 DIAGNOSIS — Z452 Encounter for adjustment and management of vascular access device: Secondary | ICD-10-CM | POA: Insufficient documentation

## 2022-07-29 NOTE — Patient Instructions (Signed)

## 2022-08-02 DIAGNOSIS — S52532A Colles' fracture of left radius, initial encounter for closed fracture: Secondary | ICD-10-CM | POA: Diagnosis not present

## 2022-08-20 DIAGNOSIS — K080 Exfoliation of teeth due to systemic causes: Secondary | ICD-10-CM | POA: Diagnosis not present

## 2022-08-22 DIAGNOSIS — M25632 Stiffness of left wrist, not elsewhere classified: Secondary | ICD-10-CM | POA: Diagnosis not present

## 2022-08-22 DIAGNOSIS — S52502S Unspecified fracture of the lower end of left radius, sequela: Secondary | ICD-10-CM | POA: Diagnosis not present

## 2022-08-29 DIAGNOSIS — S52502S Unspecified fracture of the lower end of left radius, sequela: Secondary | ICD-10-CM | POA: Diagnosis not present

## 2022-08-29 DIAGNOSIS — M25632 Stiffness of left wrist, not elsewhere classified: Secondary | ICD-10-CM | POA: Diagnosis not present

## 2022-09-11 DIAGNOSIS — K080 Exfoliation of teeth due to systemic causes: Secondary | ICD-10-CM | POA: Diagnosis not present

## 2022-09-12 DIAGNOSIS — S52502S Unspecified fracture of the lower end of left radius, sequela: Secondary | ICD-10-CM | POA: Diagnosis not present

## 2022-09-12 DIAGNOSIS — M25632 Stiffness of left wrist, not elsewhere classified: Secondary | ICD-10-CM | POA: Diagnosis not present

## 2022-09-17 DIAGNOSIS — M25632 Stiffness of left wrist, not elsewhere classified: Secondary | ICD-10-CM | POA: Diagnosis not present

## 2022-09-17 DIAGNOSIS — S52502S Unspecified fracture of the lower end of left radius, sequela: Secondary | ICD-10-CM | POA: Diagnosis not present

## 2022-09-20 ENCOUNTER — Other Ambulatory Visit: Payer: Self-pay | Admitting: Cardiology

## 2022-09-26 ENCOUNTER — Other Ambulatory Visit: Payer: Medicare Other

## 2022-09-26 ENCOUNTER — Ambulatory Visit: Payer: Medicare Other | Admitting: Hematology & Oncology

## 2022-09-26 ENCOUNTER — Inpatient Hospital Stay: Payer: Medicare Other | Attending: Hematology

## 2022-09-26 ENCOUNTER — Inpatient Hospital Stay: Payer: Medicare Other

## 2022-09-26 ENCOUNTER — Inpatient Hospital Stay (HOSPITAL_BASED_OUTPATIENT_CLINIC_OR_DEPARTMENT_OTHER): Payer: Medicare Other | Admitting: Hematology & Oncology

## 2022-09-26 VITALS — BP 129/50 | HR 74 | Temp 98.5°F | Resp 18 | Wt 106.0 lb

## 2022-09-26 DIAGNOSIS — Z9221 Personal history of antineoplastic chemotherapy: Secondary | ICD-10-CM | POA: Diagnosis not present

## 2022-09-26 DIAGNOSIS — C3411 Malignant neoplasm of upper lobe, right bronchus or lung: Secondary | ICD-10-CM | POA: Diagnosis not present

## 2022-09-26 DIAGNOSIS — C3431 Malignant neoplasm of lower lobe, right bronchus or lung: Secondary | ICD-10-CM | POA: Insufficient documentation

## 2022-09-26 DIAGNOSIS — Z923 Personal history of irradiation: Secondary | ICD-10-CM | POA: Diagnosis not present

## 2022-09-26 DIAGNOSIS — Z87891 Personal history of nicotine dependence: Secondary | ICD-10-CM | POA: Diagnosis not present

## 2022-09-26 DIAGNOSIS — C3491 Malignant neoplasm of unspecified part of right bronchus or lung: Secondary | ICD-10-CM

## 2022-09-26 LAB — CBC WITH DIFFERENTIAL (CANCER CENTER ONLY)
Abs Immature Granulocytes: 0.02 10*3/uL (ref 0.00–0.07)
Basophils Absolute: 0 10*3/uL (ref 0.0–0.1)
Basophils Relative: 1 %
Eosinophils Absolute: 0.1 10*3/uL (ref 0.0–0.5)
Eosinophils Relative: 3 %
HCT: 34.7 % — ABNORMAL LOW (ref 36.0–46.0)
Hemoglobin: 11.7 g/dL — ABNORMAL LOW (ref 12.0–15.0)
Immature Granulocytes: 0 %
Lymphocytes Relative: 21 %
Lymphs Abs: 0.9 10*3/uL (ref 0.7–4.0)
MCH: 31.7 pg (ref 26.0–34.0)
MCHC: 33.7 g/dL (ref 30.0–36.0)
MCV: 94 fL (ref 80.0–100.0)
Monocytes Absolute: 0.7 10*3/uL (ref 0.1–1.0)
Monocytes Relative: 15 %
Neutro Abs: 2.7 10*3/uL (ref 1.7–7.7)
Neutrophils Relative %: 60 %
Platelet Count: 198 10*3/uL (ref 150–400)
RBC: 3.69 MIL/uL — ABNORMAL LOW (ref 3.87–5.11)
RDW: 13.3 % (ref 11.5–15.5)
WBC Count: 4.5 10*3/uL (ref 4.0–10.5)
nRBC: 0 % (ref 0.0–0.2)

## 2022-09-26 LAB — CMP (CANCER CENTER ONLY)
ALT: 13 U/L (ref 0–44)
AST: 9 U/L — ABNORMAL LOW (ref 15–41)
Albumin: 4.1 g/dL (ref 3.5–5.0)
Alkaline Phosphatase: 68 U/L (ref 38–126)
Anion gap: 6 (ref 5–15)
BUN: 17 mg/dL (ref 8–23)
CO2: 25 mmol/L (ref 22–32)
Calcium: 9 mg/dL (ref 8.9–10.3)
Chloride: 100 mmol/L (ref 98–111)
Creatinine: 0.47 mg/dL (ref 0.44–1.00)
GFR, Estimated: >60 mL/min (ref >60)
Glucose, Bld: 108 mg/dL — ABNORMAL HIGH (ref 70–99)
Potassium: 3.8 mmol/L (ref 3.5–5.1)
Sodium: 131 mmol/L — ABNORMAL LOW (ref 135–145)
Total Bilirubin: 0.5 mg/dL (ref 0.3–1.2)
Total Protein: 6.7 g/dL (ref 6.5–8.1)

## 2022-09-26 LAB — LACTATE DEHYDROGENASE: LDH: 140 U/L (ref 98–192)

## 2022-09-26 MED ORDER — HEPARIN SOD (PORK) LOCK FLUSH 100 UNIT/ML IV SOLN
500.0000 [IU] | Freq: Once | INTRAVENOUS | Status: AC
  Administered 2022-09-26: 500 [IU] via INTRAVENOUS

## 2022-09-26 MED ORDER — SODIUM CHLORIDE 0.9% FLUSH
10.0000 mL | INTRAVENOUS | Status: DC | PRN
  Administered 2022-09-26: 10 mL via INTRAVENOUS

## 2022-09-26 NOTE — Patient Instructions (Signed)

## 2022-09-26 NOTE — Progress Notes (Signed)
Hematology and Oncology Follow Up Visit  [name] [id number] January 01, 1946 77 y.o. 09/26/2022   Principle Diagnosis:  Stage IIIA (T4N0M0) squamous cell carcinoma the right lower lung   Past Therapy: Patient completed definitive chemoradiation therapy with weekly carboplatinum /Taxol - completed on 07/16/2019   Current Therapy:        Durvalumab -- maintenance -- s/p cycle # 12 =--completed on 09/25/2020   Interim History:  [name] is here today for follow-up.  She is doing quite nicely.  Despite the hot humid summer, her lungs have not been too bad on her.  We did do a PET scan on her.  This was done in early May.  The PET scan did not show any evidence of recurrent lung cancer.  Her appetite has been good.  She is gained a few pounds.  She has had no problems with pain.  There is been no cough or shortness of breath.  She has had no change in bowel or bladder habits.  There is been no rashes.  She has had no cough.  She has had no bleeding.  Overall, I would say that her performance status is probably ECOG 1.    Medications:  Allergies as of 09/26/2022       Reactions   Bee Venom Swelling, Other (See Comments)   Brimonidine Tartrate Nausea Only   Timolol Nausea Only   Hydrocodone Bitartrate Er Itching, Rash   Brimonidine Nausea Only   Codeine Other (See Comments)   Unknown Reaction   Hydrocodone Itching, Rash, Other (See Comments)        Medication List        Accurate as of September 26, 2022 12:31 PM. If you have any questions, ask your nurse or doctor.          albuterol 108 (90 Base) MCG/ACT inhaler Commonly known as: VENTOLIN HFA   ASPIRIN LOW DOSE 81 MG tablet Generic drug: aspirin EC Take 81 mg by mouth daily.   atorvastatin 20 MG tablet Commonly known as: LIPITOR TAKE 1 TABLET(20 MG) BY MOUTH DAILY   Benefiber Powd Take by mouth daily. 3 scoops into water daily per patient   BOOST HIGH PROTEIN PO Take by mouth daily.   diphenhydrAMINE  25 MG tablet Commonly known as: BENADRYL Take 50 mg by mouth daily as needed (allergic reaction).   dorzolamide 2 % ophthalmic solution Commonly known as: TRUSOPT Place 1 drop into both eyes 2 (two) times daily.   irbesartan 150 MG tablet Commonly known as: AVAPRO Take 150 mg by mouth daily.   levothyroxine 88 MCG tablet Commonly known as: SYNTHROID Take 88 mcg by mouth every morning.   lidocaine-prilocaine cream Commonly known as: EMLA   polyethylene glycol powder 17 GM/SCOOP powder Commonly known as: GLYCOLAX/MIRALAX Take by mouth daily as needed for mild constipation.   Spiriva Respimat 2.5 MCG/ACT Aers Generic drug: Tiotropium Bromide Monohydrate Inhale 1 puff into the lungs daily.   travoprost (benzalkonium) 0.004 % ophthalmic solution Commonly known as: TRAVATAN Place 1 drop into both eyes at bedtime.   Vitamin D 50 MCG (2000 UT) tablet Take 2,000 Units by mouth daily with supper.        Allergies:  Allergies  Allergen Reactions   Bee Venom Swelling and Other (See Comments)   Brimonidine Tartrate Nausea Only   Timolol Nausea Only   Hydrocodone Bitartrate Er Itching and Rash   Brimonidine Nausea Only   Codeine Other (See Comments)    Unknown Reaction  Hydrocodone Itching, Rash and Other (See Comments)    Past Medical History, Surgical history, Social history, and Family History were reviewed and updated.  Review of Systems: Review of Systems  Constitutional: Negative.   Eyes: Negative.   Respiratory:  Positive for cough.   Cardiovascular: Negative.   Gastrointestinal: Negative.   Genitourinary: Negative.   Musculoskeletal: Negative.   Skin: Negative.   Neurological: Negative.   Endo/Heme/Allergies: Negative.   Psychiatric/Behavioral: Negative.       Physical Exam:  weight is 106 lb (48.1 kg). Her oral temperature is 98.5 F (36.9 C). Her blood pressure is 129/50 (abnormal) and her pulse is 74. Her respiration is 18 and oxygen saturation is  100%.   Wt Readings from Last 3 Encounters:  09/26/22 106 lb (48.1 kg)  05/27/22 103 lb (46.7 kg)  01/07/22 106 lb (48.1 kg)    Physical Exam Vitals reviewed.  HENT:     Head: Normocephalic and atraumatic.  Eyes:     Pupils: Pupils are equal, round, and reactive to light.  Cardiovascular:     Rate and Rhythm: Normal rate and regular rhythm.     Heart sounds: Normal heart sounds.  Pulmonary:     Effort: Pulmonary effort is normal.     Breath sounds: Normal breath sounds.  Abdominal:     General: Bowel sounds are normal.     Palpations: Abdomen is soft.  Musculoskeletal:        General: No tenderness or deformity. Normal range of motion.     Cervical back: Normal range of motion.  Lymphadenopathy:     Cervical: No cervical adenopathy.  Skin:    General: Skin is warm and dry.     Findings: No erythema or rash.  Neurological:     Mental Status: She is alert and oriented to person, place, and time.  Psychiatric:        Behavior: Behavior normal.        Thought Content: Thought content normal.        Judgment: Judgment normal.       Lab Results  Component Value Date   WBC 4.5 09/26/2022   HGB 11.7 (L) 09/26/2022   HCT 34.7 (L) 09/26/2022   MCV 94.0 09/26/2022   PLT 198 09/26/2022   Lab Results  Component Value Date   FERRITIN 311 (H) 05/25/2019   IRON 22 (L) 05/25/2019   TIBC 288 05/25/2019   UIBC 265 05/25/2019   IRONPCTSAT 8 (L) 05/25/2019   Lab Results  Component Value Date   RBC 3.69 (L) 09/26/2022   No results found for: "KPAFRELGTCHN", "LAMBDASER", "KAPLAMBRATIO" No results found for: "IGGSERUM", "IGA", "IGMSERUM" No results found for: "TOTALPROTELP", "ALBUMINELP", "A1GS", "A2GS", "BETS", "BETA2SER", "GAMS", "MSPIKE", "SPEI"   Chemistry      Component Value Date/Time   NA 131 (L) 09/26/2022 1150   K 3.8 09/26/2022 1150   CL 100 09/26/2022 1150   CO2 25 09/26/2022 1150   BUN 17 09/26/2022 1150   CREATININE 0.47 09/26/2022 1150      Component  Value Date/Time   CALCIUM 9.0 09/26/2022 1150   ALKPHOS 68 09/26/2022 1150   AST 9 (L) 09/26/2022 1150   ALT 13 09/26/2022 1150   BILITOT 0.5 09/26/2022 1150       Impression and Plan: [name] is a very nice 76 yo caucasian female with locally advanced stage IIIa non-small cell carcinoma the right lower lung.      She received chemotherapy and radiation therapy with  a very nice response.  We then went ahead and treated her with immunotherapy with Durvalumab.  We will set her up with another PET scan probably sometime in the Fall.  We will do this and likely October and then I will see her back afterwards.  I am just happy that her quality of life is doing so well.  I am glad that her lungs have not been bothered by the hot humid weather.  She still has her Port-A-Cath in.  We will continue to flushes every 2 months.  Josph Macho, MD 8/15/202412:31 PM

## 2022-10-02 DIAGNOSIS — U071 COVID-19: Secondary | ICD-10-CM | POA: Diagnosis not present

## 2022-10-03 IMAGING — CT NM PET TUM IMG RESTAG (PS) SKULL BASE T - THIGH
1 of 7 series · 1 of 25 positions shown · non-contrast
Comparison: 11/15/2020

CLINICAL DATA: Subsequent treatment strategy for non-small cell
lung cancer.

EXAM:
NUCLEAR MEDICINE PET SKULL BASE TO THIGH
TECHNIQUE: 4.9 mCi F-18 FDG was injected intravenously. Full-ring PET imaging
was performed from the skull base to thigh after the radiotracer. CT
data was obtained and used for attenuation correction and anatomic
localization.
Fasting blood glucose: 83 mg/dl

[Series 4: ct sk_thigh 5.0 bf37 · axial · 5.0mm · 0.98mm/px · 1 of 205 slices shown]
[im 205/205  brain]
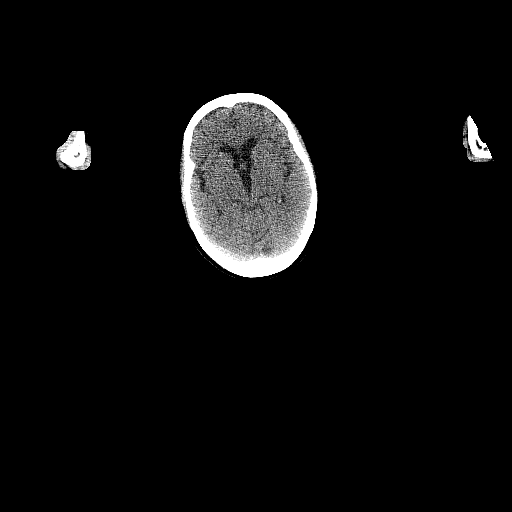

[1 of 25 positions shown; findings below may reference images not displayed]

FINDINGS: Mediastinal blood pool activity: SUV max

Liver activity: SUV max NA

NECK: No hypermetabolic lymph nodes in the neck.

Incidental CT findings: none

CHEST: No FDG avid axillary, supraclavicular, mediastinal, or hilar
lymph nodes. Mass within the superior segment of the right lower
lobe is again noted measuring 3.7 x 1.8 cm with SUV max of 1.36,
image 44/8. New increased tracer uptake within the posterior right
lung pleura. Formally 3.7 x 2.3 cm with SUV max of 1.4.

New tracer uptake corresponding to mild pleural thickening overlying
the posterior right midlung has an SUV max of 4.37. Previously 2.54.
Small right pleural effusion is unchanged. No new pulmonary nodule
or mass identified.

Incidental CT findings: Moderate changes of centrilobular emphysema.
Aortic atherosclerosis.

ABDOMEN/PELVIS: No abnormal hypermetabolic activity within the
liver, pancreas, adrenal glands, or spleen. No hypermetabolic lymph
nodes in the abdomen or pelvis.

Incidental CT findings: Aortic atherosclerosis.

SKELETON: No focal hypermetabolic activity to suggest skeletal
metastasis.

Incidental CT findings: Healed right sixth, seventh rib fracture
deformities are again noted and appear unchanged. Incompletely
healed, displaced fracture deformities involving the right eighth
and ninth ribs are also unchanged from previous exam.
IMPRESSION: 1. Stable appearance of treated lung mass within the superior
segment of right lower lobe with current SUV max of 1.36. Formally
1.4.
2. No signs of FDG avid nodal metastasis or distant metastatic
disease.
3. Small right pleural effusion, stable from previous exam.
4. Interval increase in focal, bandlike tracer uptake localizing to
posterior pleural thickening overlying the right midlung. SUV max is
equal to 4.37, previously 2.54. This is a nonspecific finding and
may reflect underlying inflammatory change. Pleural metastasis may
present with tracer avid pleural thickening. Advise close interval
follow-up advised. None

## 2022-10-22 ENCOUNTER — Encounter: Payer: Self-pay | Admitting: Hematology

## 2022-10-22 ENCOUNTER — Other Ambulatory Visit (HOSPITAL_BASED_OUTPATIENT_CLINIC_OR_DEPARTMENT_OTHER): Payer: Self-pay

## 2022-10-22 MED ORDER — COMIRNATY 30 MCG/0.3ML IM SUSY
0.3000 mL | PREFILLED_SYRINGE | Freq: Once | INTRAMUSCULAR | 0 refills | Status: AC
  Filled 2022-10-22: qty 0.3, 1d supply, fill #0

## 2022-10-22 MED ORDER — FLUAD 0.5 ML IM SUSY
0.5000 mL | PREFILLED_SYRINGE | Freq: Once | INTRAMUSCULAR | 0 refills | Status: AC
  Filled 2022-10-22: qty 0.5, 1d supply, fill #0

## 2022-11-21 DIAGNOSIS — I10 Essential (primary) hypertension: Secondary | ICD-10-CM | POA: Diagnosis not present

## 2022-11-21 DIAGNOSIS — E039 Hypothyroidism, unspecified: Secondary | ICD-10-CM | POA: Diagnosis not present

## 2022-11-21 DIAGNOSIS — E559 Vitamin D deficiency, unspecified: Secondary | ICD-10-CM | POA: Diagnosis not present

## 2022-11-26 ENCOUNTER — Inpatient Hospital Stay: Payer: Medicare Other | Attending: Hematology

## 2022-11-26 VITALS — BP 134/53 | HR 75 | Temp 97.9°F | Resp 17

## 2022-11-26 DIAGNOSIS — C3491 Malignant neoplasm of unspecified part of right bronchus or lung: Secondary | ICD-10-CM

## 2022-11-26 DIAGNOSIS — I700 Atherosclerosis of aorta: Secondary | ICD-10-CM | POA: Diagnosis not present

## 2022-11-26 DIAGNOSIS — Z452 Encounter for adjustment and management of vascular access device: Secondary | ICD-10-CM | POA: Insufficient documentation

## 2022-11-26 DIAGNOSIS — C3490 Malignant neoplasm of unspecified part of unspecified bronchus or lung: Secondary | ICD-10-CM | POA: Diagnosis not present

## 2022-11-26 DIAGNOSIS — Z95828 Presence of other vascular implants and grafts: Secondary | ICD-10-CM

## 2022-11-26 DIAGNOSIS — J439 Emphysema, unspecified: Secondary | ICD-10-CM | POA: Diagnosis not present

## 2022-11-26 DIAGNOSIS — Z0000 Encounter for general adult medical examination without abnormal findings: Secondary | ICD-10-CM | POA: Diagnosis not present

## 2022-11-26 DIAGNOSIS — E039 Hypothyroidism, unspecified: Secondary | ICD-10-CM | POA: Diagnosis not present

## 2022-11-26 DIAGNOSIS — C3411 Malignant neoplasm of upper lobe, right bronchus or lung: Secondary | ICD-10-CM

## 2022-11-26 DIAGNOSIS — C3431 Malignant neoplasm of lower lobe, right bronchus or lung: Secondary | ICD-10-CM | POA: Insufficient documentation

## 2022-11-26 MED ORDER — SODIUM CHLORIDE 0.9% FLUSH
10.0000 mL | INTRAVENOUS | Status: DC | PRN
  Administered 2022-11-26: 10 mL

## 2022-11-26 MED ORDER — HEPARIN SOD (PORK) LOCK FLUSH 100 UNIT/ML IV SOLN
500.0000 [IU] | Freq: Once | INTRAVENOUS | Status: AC | PRN
  Administered 2022-11-26: 500 [IU]

## 2022-11-26 NOTE — Patient Instructions (Signed)

## 2022-12-16 ENCOUNTER — Encounter (HOSPITAL_COMMUNITY)
Admission: RE | Admit: 2022-12-16 | Discharge: 2022-12-16 | Disposition: A | Discharge status: Home / Self Care | Payer: Medicare Other | Source: Ambulatory Visit | Attending: Hematology & Oncology | Admitting: Hematology & Oncology

## 2022-12-16 DIAGNOSIS — C3432 Malignant neoplasm of lower lobe, left bronchus or lung: Secondary | ICD-10-CM | POA: Diagnosis not present

## 2022-12-16 DIAGNOSIS — C3490 Malignant neoplasm of unspecified part of unspecified bronchus or lung: Secondary | ICD-10-CM | POA: Diagnosis not present

## 2022-12-16 DIAGNOSIS — C3411 Malignant neoplasm of upper lobe, right bronchus or lung: Secondary | ICD-10-CM | POA: Insufficient documentation

## 2022-12-16 LAB — GLUCOSE, CAPILLARY: Glucose-Capillary: 83 mg/dL (ref 70–99)

## 2022-12-16 MED ORDER — FLUDEOXYGLUCOSE F - 18 (FDG) INJECTION
5.3500 | Freq: Once | INTRAVENOUS | Status: AC
  Administered 2022-12-16: 5.35 via INTRAVENOUS

## 2022-12-31 DIAGNOSIS — K080 Exfoliation of teeth due to systemic causes: Secondary | ICD-10-CM | POA: Diagnosis not present

## 2023-01-02 ENCOUNTER — Inpatient Hospital Stay: Payer: Medicare Other

## 2023-01-02 ENCOUNTER — Inpatient Hospital Stay: Payer: Medicare Other | Attending: Hematology

## 2023-01-02 ENCOUNTER — Encounter: Payer: Self-pay | Admitting: Hematology & Oncology

## 2023-01-02 ENCOUNTER — Inpatient Hospital Stay: Payer: Medicare Other | Admitting: Hematology & Oncology

## 2023-01-02 VITALS — BP 113/97 | HR 70 | Temp 98.0°F | Resp 20 | Ht 59.5 in | Wt 107.1 lb

## 2023-01-02 DIAGNOSIS — C3431 Malignant neoplasm of lower lobe, right bronchus or lung: Secondary | ICD-10-CM | POA: Diagnosis not present

## 2023-01-02 DIAGNOSIS — Z923 Personal history of irradiation: Secondary | ICD-10-CM | POA: Insufficient documentation

## 2023-01-02 DIAGNOSIS — Z87891 Personal history of nicotine dependence: Secondary | ICD-10-CM | POA: Diagnosis not present

## 2023-01-02 DIAGNOSIS — C3411 Malignant neoplasm of upper lobe, right bronchus or lung: Secondary | ICD-10-CM | POA: Diagnosis not present

## 2023-01-02 DIAGNOSIS — Z9221 Personal history of antineoplastic chemotherapy: Secondary | ICD-10-CM | POA: Insufficient documentation

## 2023-01-02 DIAGNOSIS — Z95828 Presence of other vascular implants and grafts: Secondary | ICD-10-CM

## 2023-01-02 LAB — CBC WITH DIFFERENTIAL (CANCER CENTER ONLY)
Abs Immature Granulocytes: 0.01 10*3/uL (ref 0.00–0.07)
Basophils Absolute: 0 10*3/uL (ref 0.0–0.1)
Basophils Relative: 1 %
Eosinophils Absolute: 0.1 10*3/uL (ref 0.0–0.5)
Eosinophils Relative: 2 %
HCT: 33.1 % — ABNORMAL LOW (ref 36.0–46.0)
Hemoglobin: 11.4 g/dL — ABNORMAL LOW (ref 12.0–15.0)
Immature Granulocytes: 0 %
Lymphocytes Relative: 20 %
Lymphs Abs: 0.9 10*3/uL (ref 0.7–4.0)
MCH: 31.9 pg (ref 26.0–34.0)
MCHC: 34.4 g/dL (ref 30.0–36.0)
MCV: 92.7 fL (ref 80.0–100.0)
Monocytes Absolute: 0.5 10*3/uL (ref 0.1–1.0)
Monocytes Relative: 10 %
Neutro Abs: 3.1 10*3/uL (ref 1.7–7.7)
Neutrophils Relative %: 67 %
Platelet Count: 196 10*3/uL (ref 150–400)
RBC: 3.57 MIL/uL — ABNORMAL LOW (ref 3.87–5.11)
RDW: 13.1 % (ref 11.5–15.5)
WBC Count: 4.7 10*3/uL (ref 4.0–10.5)
nRBC: 0 % (ref 0.0–0.2)

## 2023-01-02 LAB — CMP (CANCER CENTER ONLY)
ALT: 13 U/L (ref 0–44)
AST: 10 U/L — ABNORMAL LOW (ref 15–41)
Albumin: 4.1 g/dL (ref 3.5–5.0)
Alkaline Phosphatase: 60 U/L (ref 38–126)
Anion gap: 6 (ref 5–15)
BUN: 16 mg/dL (ref 8–23)
CO2: 26 mmol/L (ref 22–32)
Calcium: 9.4 mg/dL (ref 8.9–10.3)
Chloride: 97 mmol/L — ABNORMAL LOW (ref 98–111)
Creatinine: 0.48 mg/dL (ref 0.44–1.00)
GFR, Estimated: >60 mL/min (ref >60)
Glucose, Bld: 93 mg/dL (ref 70–99)
Potassium: 4.1 mmol/L (ref 3.5–5.1)
Sodium: 129 mmol/L — ABNORMAL LOW (ref 135–145)
Total Bilirubin: 0.5 mg/dL (ref <1.2)
Total Protein: 6.7 g/dL (ref 6.5–8.1)

## 2023-01-02 MED ORDER — SODIUM CHLORIDE 0.9% FLUSH
10.0000 mL | Freq: Once | INTRAVENOUS | Status: AC
  Administered 2023-01-02: 10 mL via INTRAVENOUS

## 2023-01-02 MED ORDER — HEPARIN SOD (PORK) LOCK FLUSH 100 UNIT/ML IV SOLN
500.0000 [IU] | Freq: Once | INTRAVENOUS | Status: AC
  Administered 2023-01-02: 500 [IU] via INTRAVENOUS

## 2023-01-02 NOTE — Progress Notes (Signed)
Hematology and Oncology Follow Up Visit  OCTIVIA BAZER [id number] 02/06/1946 77 y.o. 01/02/2023   Principle Diagnosis:  Stage IIIA (T4N0M0) squamous cell carcinoma the right lower lung   Past Therapy: Patient completed definitive chemoradiation therapy with weekly carboplatinum /Taxol - completed on 07/16/2019   Current Therapy:        Durvalumab -- maintenance -- s/p cycle # 12 =--completed on 09/25/2020   Interim History:  Ms. Greever is here today for follow-up.  She is doing quite nicely.  The cold weather certainly helps with her lungs.  She is looking forward to the Holiday season.  She was present with her family.  She had a PET scan that was done.  We did this a week or so ago.  Thankfully, the PET scan did not show any evidence of recurrent disease..  She has had no problems with nausea or vomiting.  She has had no problems with bowels or bladder.  She has had no rashes.  There is been no pain issues.  She has some chronic back issues but this is more arthritic.  She has had no leg swelling.  There is been no headaches.  Overall, I would say performance status is probably ECOG 1.    Medications:  Allergies as of 01/02/2023       Reactions   Bee Venom Swelling, Other (See Comments)   Brimonidine Tartrate Nausea Only   Timolol Nausea Only   Hydrocodone Bitartrate Er Itching, Rash   Brimonidine Nausea Only   Codeine Other (See Comments)   Unknown Reaction   Hydrocodone Itching, Rash, Other (See Comments)        Medication List        Accurate as of January 02, 2023  1:16 PM. If you have any questions, ask your nurse or doctor.          albuterol 108 (90 Base) MCG/ACT inhaler Commonly known as: VENTOLIN HFA   amoxicillin 500 MG tablet Commonly known as: AMOXIL Takes 2000 mg one hour before dental cleanings.   ASPIRIN LOW DOSE 81 MG tablet Generic drug: aspirin EC Take 81 mg by mouth daily.   atorvastatin 20 MG tablet Commonly known as:  LIPITOR TAKE 1 TABLET(20 MG) BY MOUTH DAILY   Benefiber Powd Take by mouth daily. 3 scoops into water daily per patient   BOOST HIGH PROTEIN PO Take by mouth daily.   diphenhydrAMINE 25 MG tablet Commonly known as: BENADRYL Take 50 mg by mouth daily as needed (allergic reaction).   dorzolamide 2 % ophthalmic solution Commonly known as: TRUSOPT Place 1 drop into both eyes 2 (two) times daily.   famotidine 20 MG tablet Commonly known as: PEPCID Take 20 mg by mouth at bedtime.   irbesartan 150 MG tablet Commonly known as: AVAPRO Take 150 mg by mouth daily.   levothyroxine 88 MCG tablet Commonly known as: SYNTHROID Take 88 mcg by mouth every morning.   lidocaine-prilocaine cream Commonly known as: EMLA   polyethylene glycol powder 17 GM/SCOOP powder Commonly known as: GLYCOLAX/MIRALAX Take by mouth daily as needed for mild constipation.   prednisoLONE acetate 1 % ophthalmic suspension Commonly known as: PRED FORTE Place 1 drop into both eyes daily as needed.   Spiriva Respimat 2.5 MCG/ACT Aers Generic drug: Tiotropium Bromide Monohydrate Inhale 1 puff into the lungs daily.   travoprost (benzalkonium) 0.004 % ophthalmic solution Commonly known as: TRAVATAN Place 1 drop into both eyes at bedtime.   Vitamin D 50 MCG (2000 UT)  tablet Take 3,000 Units by mouth daily with supper.        Allergies:  Allergies  Allergen Reactions   Bee Venom Swelling and Other (See Comments)   Brimonidine Tartrate Nausea Only   Timolol Nausea Only   Hydrocodone Bitartrate Er Itching and Rash   Brimonidine Nausea Only   Codeine Other (See Comments)    Unknown Reaction   Hydrocodone Itching, Rash and Other (See Comments)    Past Medical History, Surgical history, Social history, and Family History were reviewed and updated.  Review of Systems: Review of Systems  Constitutional: Negative.   Eyes: Negative.   Respiratory:  Positive for cough.   Cardiovascular: Negative.    Gastrointestinal: Negative.   Genitourinary: Negative.   Musculoskeletal: Negative.   Skin: Negative.   Neurological: Negative.   Endo/Heme/Allergies: Negative.   Psychiatric/Behavioral: Negative.       Physical Exam:  height is 4' 11.5" (1.511 m) and weight is 107 lb 1.9 oz (48.6 kg). Her oral temperature is 98 F (36.7 C). Her blood pressure is 113/97 (abnormal) and her pulse is 70. Her respiration is 20 and oxygen saturation is 99%.   Wt Readings from Last 3 Encounters:  01/02/23 107 lb 1.9 oz (48.6 kg)  09/26/22 106 lb (48.1 kg)  05/27/22 103 lb (46.7 kg)    Physical Exam Vitals reviewed.  HENT:     Head: Normocephalic and atraumatic.  Eyes:     Pupils: Pupils are equal, round, and reactive to light.  Cardiovascular:     Rate and Rhythm: Normal rate and regular rhythm.     Heart sounds: Normal heart sounds.  Pulmonary:     Effort: Pulmonary effort is normal.     Breath sounds: Normal breath sounds.  Abdominal:     General: Bowel sounds are normal.     Palpations: Abdomen is soft.  Musculoskeletal:        General: No tenderness or deformity. Normal range of motion.     Cervical back: Normal range of motion.  Lymphadenopathy:     Cervical: No cervical adenopathy.  Skin:    General: Skin is warm and dry.     Findings: No erythema or rash.  Neurological:     Mental Status: She is alert and oriented to person, place, and time.  Psychiatric:        Behavior: Behavior normal.        Thought Content: Thought content normal.        Judgment: Judgment normal.      Lab Results  Component Value Date   WBC 4.7 01/02/2023   HGB 11.4 (L) 01/02/2023   HCT 33.1 (L) 01/02/2023   MCV 92.7 01/02/2023   PLT 196 01/02/2023   Lab Results  Component Value Date   FERRITIN 311 (H) 05/25/2019   IRON 22 (L) 05/25/2019   TIBC 288 05/25/2019   UIBC 265 05/25/2019   IRONPCTSAT 8 (L) 05/25/2019   Lab Results  Component Value Date   RBC 3.57 (L) 01/02/2023   No results  found for: "KPAFRELGTCHN", "LAMBDASER", "KAPLAMBRATIO" No results found for: "IGGSERUM", "IGA", "IGMSERUM" No results found for: "TOTALPROTELP", "ALBUMINELP", "A1GS", "A2GS", "BETS", "BETA2SER", "GAMS", "MSPIKE", "SPEI"   Chemistry      Component Value Date/Time   NA 129 (L) 01/02/2023 1141   K 4.1 01/02/2023 1141   CL 97 (L) 01/02/2023 1141   CO2 26 01/02/2023 1141   BUN 16 01/02/2023 1141   CREATININE 0.48 01/02/2023 1141  Component Value Date/Time   CALCIUM 9.4 01/02/2023 1141   ALKPHOS 60 01/02/2023 1141   AST 10 (L) 01/02/2023 1141   ALT 13 01/02/2023 1141   BILITOT 0.5 01/02/2023 1141       Impression and Plan: Ms. Ziemke is a very nice 77 yo caucasian female with locally advanced stage IIIa non-small cell carcinoma the right lower lung.      She received chemotherapy and radiation therapy with a very nice response.  We then went ahead and treated her with immunotherapy with Durvalumab.  We will try to move her appointments out a little bit longer now.  She is done incredibly well.  We will plan to get her back in 4 months now as this will get her through the Winter.  We will do another PET scan a couple weeks before we see her back.  She still has her Port-A-Cath in.  We will make sure it flushes in 2 months.    Josph Macho, MD 11/21/20241:16 PM

## 2023-01-02 NOTE — Patient Instructions (Signed)

## 2023-01-21 DIAGNOSIS — H04123 Dry eye syndrome of bilateral lacrimal glands: Secondary | ICD-10-CM | POA: Diagnosis not present

## 2023-01-21 DIAGNOSIS — H0100B Unspecified blepharitis left eye, upper and lower eyelids: Secondary | ICD-10-CM | POA: Diagnosis not present

## 2023-01-21 DIAGNOSIS — H401131 Primary open-angle glaucoma, bilateral, mild stage: Secondary | ICD-10-CM | POA: Diagnosis not present

## 2023-01-21 DIAGNOSIS — H0100A Unspecified blepharitis right eye, upper and lower eyelids: Secondary | ICD-10-CM | POA: Diagnosis not present

## 2023-01-22 DIAGNOSIS — K5909 Other constipation: Secondary | ICD-10-CM | POA: Diagnosis not present

## 2023-01-22 DIAGNOSIS — R1011 Right upper quadrant pain: Secondary | ICD-10-CM | POA: Diagnosis not present

## 2023-01-22 DIAGNOSIS — K219 Gastro-esophageal reflux disease without esophagitis: Secondary | ICD-10-CM | POA: Diagnosis not present

## 2023-03-04 ENCOUNTER — Inpatient Hospital Stay: Payer: Medicare Other | Attending: Hematology

## 2023-03-04 VITALS — BP 160/58 | HR 72 | Temp 98.0°F | Resp 17

## 2023-03-04 DIAGNOSIS — C3431 Malignant neoplasm of lower lobe, right bronchus or lung: Secondary | ICD-10-CM | POA: Diagnosis not present

## 2023-03-04 DIAGNOSIS — Z452 Encounter for adjustment and management of vascular access device: Secondary | ICD-10-CM | POA: Diagnosis not present

## 2023-03-04 DIAGNOSIS — Z95828 Presence of other vascular implants and grafts: Secondary | ICD-10-CM

## 2023-03-04 DIAGNOSIS — Z87891 Personal history of nicotine dependence: Secondary | ICD-10-CM | POA: Diagnosis not present

## 2023-03-04 MED ORDER — HEPARIN SOD (PORK) LOCK FLUSH 100 UNIT/ML IV SOLN
500.0000 [IU] | Freq: Once | INTRAVENOUS | Status: AC
  Administered 2023-03-04: 500 [IU] via INTRAVENOUS

## 2023-03-04 MED ORDER — SODIUM CHLORIDE 0.9% FLUSH
10.0000 mL | Freq: Once | INTRAVENOUS | Status: AC
  Administered 2023-03-04: 10 mL via INTRAVENOUS

## 2023-03-04 NOTE — Patient Instructions (Signed)

## 2023-03-13 DIAGNOSIS — K2930 Chronic superficial gastritis without bleeding: Secondary | ICD-10-CM | POA: Diagnosis not present

## 2023-03-13 DIAGNOSIS — K449 Diaphragmatic hernia without obstruction or gangrene: Secondary | ICD-10-CM | POA: Diagnosis not present

## 2023-03-13 DIAGNOSIS — K635 Polyp of colon: Secondary | ICD-10-CM | POA: Diagnosis not present

## 2023-03-13 DIAGNOSIS — K269 Duodenal ulcer, unspecified as acute or chronic, without hemorrhage or perforation: Secondary | ICD-10-CM | POA: Diagnosis not present

## 2023-03-13 DIAGNOSIS — K259 Gastric ulcer, unspecified as acute or chronic, without hemorrhage or perforation: Secondary | ICD-10-CM | POA: Diagnosis not present

## 2023-03-13 DIAGNOSIS — K2961 Other gastritis with bleeding: Secondary | ICD-10-CM | POA: Diagnosis not present

## 2023-03-13 DIAGNOSIS — K222 Esophageal obstruction: Secondary | ICD-10-CM | POA: Diagnosis not present

## 2023-03-13 DIAGNOSIS — Z1211 Encounter for screening for malignant neoplasm of colon: Secondary | ICD-10-CM | POA: Diagnosis not present

## 2023-03-13 DIAGNOSIS — K2100 Gastro-esophageal reflux disease with esophagitis, without bleeding: Secondary | ICD-10-CM | POA: Diagnosis not present

## 2023-03-13 DIAGNOSIS — K5730 Diverticulosis of large intestine without perforation or abscess without bleeding: Secondary | ICD-10-CM | POA: Diagnosis not present

## 2023-03-13 DIAGNOSIS — R12 Heartburn: Secondary | ICD-10-CM | POA: Diagnosis not present

## 2023-03-13 DIAGNOSIS — K648 Other hemorrhoids: Secondary | ICD-10-CM | POA: Diagnosis not present

## 2023-03-18 DIAGNOSIS — K2930 Chronic superficial gastritis without bleeding: Secondary | ICD-10-CM | POA: Diagnosis not present

## 2023-03-18 DIAGNOSIS — K635 Polyp of colon: Secondary | ICD-10-CM | POA: Diagnosis not present

## 2023-03-19 DIAGNOSIS — K080 Exfoliation of teeth due to systemic causes: Secondary | ICD-10-CM | POA: Diagnosis not present

## 2023-04-01 DIAGNOSIS — L578 Other skin changes due to chronic exposure to nonionizing radiation: Secondary | ICD-10-CM | POA: Diagnosis not present

## 2023-04-01 DIAGNOSIS — L821 Other seborrheic keratosis: Secondary | ICD-10-CM | POA: Diagnosis not present

## 2023-04-01 DIAGNOSIS — D225 Melanocytic nevi of trunk: Secondary | ICD-10-CM | POA: Diagnosis not present

## 2023-04-01 DIAGNOSIS — L814 Other melanin hyperpigmentation: Secondary | ICD-10-CM | POA: Diagnosis not present

## 2023-04-14 ENCOUNTER — Encounter (HOSPITAL_COMMUNITY)
Admission: RE | Admit: 2023-04-14 | Discharge: 2023-04-14 | Disposition: A | Discharge status: Home / Self Care | Payer: Medicare Other | Source: Ambulatory Visit | Attending: Hematology & Oncology | Admitting: Hematology & Oncology

## 2023-04-14 DIAGNOSIS — C3490 Malignant neoplasm of unspecified part of unspecified bronchus or lung: Secondary | ICD-10-CM | POA: Diagnosis not present

## 2023-04-14 DIAGNOSIS — C3411 Malignant neoplasm of upper lobe, right bronchus or lung: Secondary | ICD-10-CM | POA: Insufficient documentation

## 2023-04-14 LAB — GLUCOSE, CAPILLARY: Glucose-Capillary: 92 mg/dL (ref 70–99)

## 2023-04-14 MED ORDER — FLUDEOXYGLUCOSE F - 18 (FDG) INJECTION
5.0460 | Freq: Once | INTRAVENOUS | Status: AC
  Administered 2023-04-14: 5.046 via INTRAVENOUS

## 2023-05-01 ENCOUNTER — Inpatient Hospital Stay: Payer: Medicare Other | Attending: Hematology

## 2023-05-01 ENCOUNTER — Inpatient Hospital Stay: Payer: Medicare Other

## 2023-05-01 ENCOUNTER — Inpatient Hospital Stay: Payer: Medicare Other | Admitting: Hematology & Oncology

## 2023-05-01 ENCOUNTER — Encounter: Payer: Self-pay | Admitting: Hematology & Oncology

## 2023-05-01 ENCOUNTER — Telehealth: Payer: Self-pay

## 2023-05-01 VITALS — BP 116/53 | HR 79 | Temp 98.3°F | Resp 17 | Ht 59.5 in | Wt 110.0 lb

## 2023-05-01 DIAGNOSIS — C3431 Malignant neoplasm of lower lobe, right bronchus or lung: Secondary | ICD-10-CM | POA: Diagnosis not present

## 2023-05-01 DIAGNOSIS — Z9221 Personal history of antineoplastic chemotherapy: Secondary | ICD-10-CM | POA: Insufficient documentation

## 2023-05-01 DIAGNOSIS — Z87891 Personal history of nicotine dependence: Secondary | ICD-10-CM | POA: Diagnosis not present

## 2023-05-01 DIAGNOSIS — Z923 Personal history of irradiation: Secondary | ICD-10-CM | POA: Diagnosis not present

## 2023-05-01 DIAGNOSIS — C3411 Malignant neoplasm of upper lobe, right bronchus or lung: Secondary | ICD-10-CM

## 2023-05-01 LAB — CBC WITH DIFFERENTIAL (CANCER CENTER ONLY)
Abs Immature Granulocytes: 0.04 10*3/uL (ref 0.00–0.07)
Basophils Absolute: 0 10*3/uL (ref 0.0–0.1)
Basophils Relative: 0 %
Eosinophils Absolute: 0.1 10*3/uL (ref 0.0–0.5)
Eosinophils Relative: 3 %
HCT: 34.6 % — ABNORMAL LOW (ref 36.0–46.0)
Hemoglobin: 11.7 g/dL — ABNORMAL LOW (ref 12.0–15.0)
Immature Granulocytes: 1 %
Lymphocytes Relative: 17 %
Lymphs Abs: 0.8 10*3/uL (ref 0.7–4.0)
MCH: 31.5 pg (ref 26.0–34.0)
MCHC: 33.8 g/dL (ref 30.0–36.0)
MCV: 93.3 fL (ref 80.0–100.0)
Monocytes Absolute: 0.6 10*3/uL (ref 0.1–1.0)
Monocytes Relative: 12 %
Neutro Abs: 3.1 10*3/uL (ref 1.7–7.7)
Neutrophils Relative %: 67 %
Platelet Count: 188 10*3/uL (ref 150–400)
RBC: 3.71 MIL/uL — ABNORMAL LOW (ref 3.87–5.11)
RDW: 13 % (ref 11.5–15.5)
WBC Count: 4.6 10*3/uL (ref 4.0–10.5)
nRBC: 0 % (ref 0.0–0.2)

## 2023-05-01 LAB — CMP (CANCER CENTER ONLY)
ALT: 15 U/L (ref 0–44)
AST: 10 U/L — ABNORMAL LOW (ref 15–41)
Albumin: 4.4 g/dL (ref 3.5–5.0)
Alkaline Phosphatase: 67 U/L (ref 38–126)
Anion gap: 7 (ref 5–15)
BUN: 14 mg/dL (ref 8–23)
CO2: 26 mmol/L (ref 22–32)
Calcium: 9.2 mg/dL (ref 8.9–10.3)
Chloride: 99 mmol/L (ref 98–111)
Creatinine: 0.54 mg/dL (ref 0.44–1.00)
GFR, Estimated: >60 mL/min (ref >60)
Glucose, Bld: 105 mg/dL — ABNORMAL HIGH (ref 70–99)
Potassium: 4.2 mmol/L (ref 3.5–5.1)
Sodium: 132 mmol/L — ABNORMAL LOW (ref 135–145)
Total Bilirubin: 0.6 mg/dL (ref 0.0–1.2)
Total Protein: 6.6 g/dL (ref 6.5–8.1)

## 2023-05-01 LAB — IRON AND IRON BINDING CAPACITY (CC-WL,HP ONLY)
Iron: 109 ug/dL (ref 28–170)
Saturation Ratios: 31 % (ref 10.4–31.8)
TIBC: 357 ug/dL (ref 250–450)
UIBC: 248 ug/dL (ref 148–442)

## 2023-05-01 LAB — FERRITIN: Ferritin: 74 ng/mL (ref 11–307)

## 2023-05-01 LAB — RETICULOCYTES
Immature Retic Fract: 4.7 % (ref 2.3–15.9)
RBC.: 3.76 MIL/uL — ABNORMAL LOW (ref 3.87–5.11)
Retic Count, Absolute: 42.5 10*3/uL (ref 19.0–186.0)
Retic Ct Pct: 1.1 % (ref 0.4–3.1)

## 2023-05-01 LAB — SAVE SMEAR(SSMR), FOR PROVIDER SLIDE REVIEW

## 2023-05-01 NOTE — Patient Instructions (Signed)

## 2023-05-01 NOTE — Telephone Encounter (Signed)
-----   Message from Josph Macho sent at 05/01/2023 11:44 AM EDT ----- Please call and let her know that the PET scan still looks fantastic.  No evidence of recurrent cancer.  Cindee Lame

## 2023-05-01 NOTE — Telephone Encounter (Signed)
 Advised via MyChart.

## 2023-05-01 NOTE — Progress Notes (Signed)
 Hematology and Oncology Follow Up Visit  [name] [id number] 1945/10/07 78 y.o. 05/01/2023   Principle Diagnosis:  Stage IIIA (T4N0M0) squamous cell carcinoma the right lower lung   Past Therapy: Patient completed definitive chemoradiation therapy with weekly carboplatinum /Taxol - completed on 07/16/2019   Current Therapy:        Durvalumab -- maintenance -- s/p cycle # 12 =--completed on 09/25/2020   Interim History:  [name] is here today for follow-up.  We saw her back in November.  Since then, she has had no problems at all.  She had no problems over the holiday season.  She had a PET scan that was done.  This was done on 04/14/2023.  PET scan not show any evidence of recurrent lung cancer.  She has had no problems with her lungs.  The colder weather certainly makes her lungs better.  She has had no nausea or vomiting.  She has a granddaughter in [facility] who is having GI problems.  We will have to stay strong in prayer for her..    She has had no melena or bright red blood per rectum.   Thankfully, she has had no problems with COVID or Influenza.  She has had no rashes.  She has not noted any swollen lymph nodes.  Overall, I would say performance status is ECOG 1.   Medications:  Allergies as of 05/01/2023       Reactions   Bee Venom Swelling, Other (See Comments)   Brimonidine Tartrate Nausea Only   Timolol Nausea Only   Hydrocodone Bitartrate Er Itching, Rash   Brimonidine Nausea Only   Codeine Other (See Comments)   Unknown Reaction   Hydrocodone Itching, Rash, Other (See Comments)        Medication List        Accurate as of May 01, 2023  1:11 PM. If you have any questions, ask your nurse or doctor.          albuterol 108 (90 Base) MCG/ACT inhaler Commonly known as: VENTOLIN HFA   amoxicillin 500 MG tablet Commonly known as: AMOXIL Takes 2000 mg one hour before dental cleanings.   ASPIRIN LOW DOSE 81 MG tablet Generic drug:  aspirin EC Take 81 mg by mouth daily.   atorvastatin 20 MG tablet Commonly known as: LIPITOR TAKE 1 TABLET(20 MG) BY MOUTH DAILY   Benefiber Powd Take by mouth daily. 3 scoops into water daily per patient   BOOST HIGH PROTEIN PO Take by mouth daily.   diphenhydrAMINE 25 MG tablet Commonly known as: BENADRYL Take 50 mg by mouth daily as needed (allergic reaction).   dorzolamide 2 % ophthalmic solution Commonly known as: TRUSOPT Place 1 drop into both eyes 2 (two) times daily.   famotidine 20 MG tablet Commonly known as: PEPCID Take 20 mg by mouth at bedtime.   irbesartan 150 MG tablet Commonly known as: AVAPRO Take 150 mg by mouth daily.   levothyroxine 88 MCG tablet Commonly known as: SYNTHROID Take 88 mcg by mouth every morning.   lidocaine-prilocaine cream Commonly known as: EMLA   pantoprazole 40 MG tablet Commonly known as: PROTONIX Take 40 mg by mouth 2 (two) times daily.   polyethylene glycol powder 17 GM/SCOOP powder Commonly known as: GLYCOLAX/MIRALAX Take by mouth daily as needed for mild constipation.   prednisoLONE acetate 1 % ophthalmic suspension Commonly known as: PRED FORTE Place 1 drop into both eyes daily as needed.   Spiriva Respimat 2.5 MCG/ACT Aers  Generic drug: Tiotropium Bromide Monohydrate Inhale 2 puffs into the lungs daily.   travoprost (benzalkonium) 0.004 % ophthalmic solution Commonly known as: TRAVATAN Place 1 drop into both eyes at bedtime.   Vitamin D 50 MCG (2000 UT) tablet Take 3,000 Units by mouth daily with supper.        Allergies:  Allergies  Allergen Reactions   Bee Venom Swelling and Other (See Comments)   Brimonidine Tartrate Nausea Only   Timolol Nausea Only   Hydrocodone Bitartrate Er Itching and Rash   Brimonidine Nausea Only   Codeine Other (See Comments)    Unknown Reaction   Hydrocodone Itching, Rash and Other (See Comments)    Past Medical History, Surgical history, Social history, and Family  History were reviewed and updated.  Review of Systems: Review of Systems  Constitutional: Negative.   Eyes: Negative.   Respiratory:  Positive for cough.   Cardiovascular: Negative.   Gastrointestinal: Negative.   Genitourinary: Negative.   Musculoskeletal: Negative.   Skin: Negative.   Neurological: Negative.   Endo/Heme/Allergies: Negative.   Psychiatric/Behavioral: Negative.       Physical Exam:  height is 4' 11.5" (1.511 m) and weight is 110 lb (49.9 kg). Her oral temperature is 98.3 F (36.8 C). Her blood pressure is 116/53 (abnormal) and her pulse is 79. Her respiration is 17 and oxygen saturation is 98%.   Wt Readings from Last 3 Encounters:  05/01/23 110 lb (49.9 kg)  01/02/23 107 lb 1.9 oz (48.6 kg)  09/26/22 106 lb (48.1 kg)    Physical Exam Vitals reviewed.  HENT:     Head: Normocephalic and atraumatic.  Eyes:     Pupils: Pupils are equal, round, and reactive to light.  Cardiovascular:     Rate and Rhythm: Normal rate and regular rhythm.     Heart sounds: Normal heart sounds.  Pulmonary:     Effort: Pulmonary effort is normal.     Breath sounds: Normal breath sounds.  Abdominal:     General: Bowel sounds are normal.     Palpations: Abdomen is soft.  Musculoskeletal:        General: No tenderness or deformity. Normal range of motion.     Cervical back: Normal range of motion.  Lymphadenopathy:     Cervical: No cervical adenopathy.  Skin:    General: Skin is warm and dry.     Findings: No erythema or rash.  Neurological:     Mental Status: She is alert and oriented to person, place, and time.  Psychiatric:        Behavior: Behavior normal.        Thought Content: Thought content normal.        Judgment: Judgment normal.       Lab Results  Component Value Date   WBC 4.6 05/01/2023   HGB 11.7 (L) 05/01/2023   HCT 34.6 (L) 05/01/2023   MCV 93.3 05/01/2023   PLT 188 05/01/2023   Lab Results  Component Value Date   FERRITIN 311 (H) 05/25/2019    IRON 22 (L) 05/25/2019   TIBC 288 05/25/2019   UIBC 265 05/25/2019   IRONPCTSAT 8 (L) 05/25/2019   Lab Results  Component Value Date   RETICCTPCT 1.1 05/01/2023   RBC 3.71 (L) 05/01/2023   RBC 3.76 (L) 05/01/2023   No results found for: "KPAFRELGTCHN", "LAMBDASER", "KAPLAMBRATIO" No results found for: "IGGSERUM", "IGA", "IGMSERUM" No results found for: "TOTALPROTELP", "ALBUMINELP", "A1GS", "A2GS", "BETS", "BETA2SER", "GAMS", "MSPIKE", "SPEI"  Chemistry      Component Value Date/Time   NA 132 (L) 05/01/2023 1215   K 4.2 05/01/2023 1215   CL 99 05/01/2023 1215   CO2 26 05/01/2023 1215   BUN 14 05/01/2023 1215   CREATININE 0.54 05/01/2023 1215      Component Value Date/Time   CALCIUM 9.2 05/01/2023 1215   ALKPHOS 67 05/01/2023 1215   AST 10 (L) 05/01/2023 1215   ALT 15 05/01/2023 1215   BILITOT 0.6 05/01/2023 1215       Impression and Plan: [name] is a very nice 78 yo caucasian female with locally advanced stage IIIa non-small cell carcinoma the right lower lung.      She received chemotherapy and radiation therapy with a very nice response.  We then went ahead and treated her with immunotherapy with Durvalumab.  Everything looks quite good.  She still has her Port-A-Cath in.  With make sure we flush this.  I think we can probably hold off on doing a PET scan for about 6 months now.  She has a Port-A-Cath and that we have to flush every 2 months.  We will plan for a PET scan sometime this Summer.  I will then see her back.   Josph Macho, MD 3/20/20251:11 PM

## 2023-05-08 DIAGNOSIS — K209 Esophagitis, unspecified without bleeding: Secondary | ICD-10-CM | POA: Diagnosis not present

## 2023-05-08 DIAGNOSIS — K259 Gastric ulcer, unspecified as acute or chronic, without hemorrhage or perforation: Secondary | ICD-10-CM | POA: Diagnosis not present

## 2023-05-08 DIAGNOSIS — K2970 Gastritis, unspecified, without bleeding: Secondary | ICD-10-CM | POA: Diagnosis not present

## 2023-06-12 ENCOUNTER — Inpatient Hospital Stay: Attending: Hematology

## 2023-06-12 DIAGNOSIS — Z452 Encounter for adjustment and management of vascular access device: Secondary | ICD-10-CM | POA: Diagnosis not present

## 2023-06-12 DIAGNOSIS — Z87891 Personal history of nicotine dependence: Secondary | ICD-10-CM | POA: Diagnosis not present

## 2023-06-12 DIAGNOSIS — C3431 Malignant neoplasm of lower lobe, right bronchus or lung: Secondary | ICD-10-CM | POA: Diagnosis not present

## 2023-06-12 NOTE — Patient Instructions (Signed)

## 2023-07-24 ENCOUNTER — Inpatient Hospital Stay

## 2023-07-24 DIAGNOSIS — H401131 Primary open-angle glaucoma, bilateral, mild stage: Secondary | ICD-10-CM | POA: Diagnosis not present

## 2023-07-24 DIAGNOSIS — H26491 Other secondary cataract, right eye: Secondary | ICD-10-CM | POA: Diagnosis not present

## 2023-07-24 DIAGNOSIS — H04123 Dry eye syndrome of bilateral lacrimal glands: Secondary | ICD-10-CM | POA: Diagnosis not present

## 2023-07-29 ENCOUNTER — Inpatient Hospital Stay: Attending: Hematology

## 2023-07-29 VITALS — BP 128/47 | HR 75 | Temp 97.7°F | Resp 17

## 2023-07-29 DIAGNOSIS — Z87891 Personal history of nicotine dependence: Secondary | ICD-10-CM | POA: Insufficient documentation

## 2023-07-29 DIAGNOSIS — C3431 Malignant neoplasm of lower lobe, right bronchus or lung: Secondary | ICD-10-CM | POA: Diagnosis not present

## 2023-07-29 DIAGNOSIS — Z452 Encounter for adjustment and management of vascular access device: Secondary | ICD-10-CM | POA: Insufficient documentation

## 2023-07-29 MED ORDER — SODIUM CHLORIDE 0.9% FLUSH
10.0000 mL | INTRAVENOUS | Status: DC | PRN
  Administered 2023-07-29: 10 mL

## 2023-07-29 MED ORDER — HEPARIN SOD (PORK) LOCK FLUSH 100 UNIT/ML IV SOLN
500.0000 [IU] | Freq: Once | INTRAVENOUS | Status: AC | PRN
  Administered 2023-07-29: 500 [IU]

## 2023-07-29 NOTE — Patient Instructions (Signed)

## 2023-09-19 ENCOUNTER — Ambulatory Visit (HOSPITAL_COMMUNITY)
Admission: RE | Admit: 2023-09-19 | Discharge: 2023-09-19 | Disposition: A | Discharge status: Home / Self Care | Source: Ambulatory Visit | Attending: Hematology & Oncology | Admitting: Hematology & Oncology

## 2023-09-19 DIAGNOSIS — C3431 Malignant neoplasm of lower lobe, right bronchus or lung: Secondary | ICD-10-CM | POA: Insufficient documentation

## 2023-09-19 LAB — GLUCOSE, CAPILLARY: Glucose-Capillary: 95 mg/dL (ref 70–99)

## 2023-09-19 MED ORDER — FLUDEOXYGLUCOSE F - 18 (FDG) INJECTION
5.5800 | Freq: Once | INTRAVENOUS | Status: AC
  Administered 2023-09-19: 5.58 via INTRAVENOUS

## 2023-09-24 DIAGNOSIS — K080 Exfoliation of teeth due to systemic causes: Secondary | ICD-10-CM | POA: Diagnosis not present

## 2023-10-02 ENCOUNTER — Other Ambulatory Visit: Payer: Self-pay | Admitting: *Deleted

## 2023-10-02 ENCOUNTER — Inpatient Hospital Stay (HOSPITAL_BASED_OUTPATIENT_CLINIC_OR_DEPARTMENT_OTHER): Admitting: Hematology & Oncology

## 2023-10-02 ENCOUNTER — Inpatient Hospital Stay: Attending: Hematology

## 2023-10-02 ENCOUNTER — Encounter: Payer: Self-pay | Admitting: Hematology & Oncology

## 2023-10-02 ENCOUNTER — Ambulatory Visit: Payer: Self-pay | Admitting: Hematology & Oncology

## 2023-10-02 ENCOUNTER — Inpatient Hospital Stay

## 2023-10-02 ENCOUNTER — Other Ambulatory Visit: Payer: Self-pay

## 2023-10-02 VITALS — BP 139/62 | HR 68 | Temp 98.2°F | Resp 16 | Ht 59.5 in | Wt 105.0 lb

## 2023-10-02 DIAGNOSIS — D6481 Anemia due to antineoplastic chemotherapy: Secondary | ICD-10-CM

## 2023-10-02 DIAGNOSIS — Z87891 Personal history of nicotine dependence: Secondary | ICD-10-CM | POA: Insufficient documentation

## 2023-10-02 DIAGNOSIS — Z95828 Presence of other vascular implants and grafts: Secondary | ICD-10-CM

## 2023-10-02 DIAGNOSIS — E039 Hypothyroidism, unspecified: Secondary | ICD-10-CM

## 2023-10-02 DIAGNOSIS — C3411 Malignant neoplasm of upper lobe, right bronchus or lung: Secondary | ICD-10-CM

## 2023-10-02 DIAGNOSIS — Z79899 Other long term (current) drug therapy: Secondary | ICD-10-CM | POA: Diagnosis not present

## 2023-10-02 DIAGNOSIS — E871 Hypo-osmolality and hyponatremia: Secondary | ICD-10-CM

## 2023-10-02 DIAGNOSIS — C3431 Malignant neoplasm of lower lobe, right bronchus or lung: Secondary | ICD-10-CM | POA: Insufficient documentation

## 2023-10-02 LAB — FERRITIN: Ferritin: 122 ng/mL (ref 11–307)

## 2023-10-02 LAB — CMP (CANCER CENTER ONLY)
ALT: 17 U/L (ref 0–44)
AST: 13 U/L — ABNORMAL LOW (ref 15–41)
Albumin: 4.3 g/dL (ref 3.5–5.0)
Alkaline Phosphatase: 87 U/L (ref 38–126)
Anion gap: 10 (ref 5–15)
BUN: 13 mg/dL (ref 8–23)
CO2: 24 mmol/L (ref 22–32)
Calcium: 9.2 mg/dL (ref 8.9–10.3)
Chloride: 95 mmol/L — ABNORMAL LOW (ref 98–111)
Creatinine: 0.5 mg/dL (ref 0.44–1.00)
GFR, Estimated: >60 mL/min (ref >60)
Glucose, Bld: 83 mg/dL (ref 70–99)
Potassium: 4.1 mmol/L (ref 3.5–5.1)
Sodium: 130 mmol/L — ABNORMAL LOW (ref 135–145)
Total Bilirubin: 0.6 mg/dL (ref 0.0–1.2)
Total Protein: 6.7 g/dL (ref 6.5–8.1)

## 2023-10-02 LAB — CBC WITH DIFFERENTIAL (CANCER CENTER ONLY)
Abs Immature Granulocytes: 0.02 K/uL (ref 0.00–0.07)
Basophils Absolute: 0 K/uL (ref 0.0–0.1)
Basophils Relative: 1 %
Eosinophils Absolute: 0.1 K/uL (ref 0.0–0.5)
Eosinophils Relative: 3 %
HCT: 34.4 % — ABNORMAL LOW (ref 36.0–46.0)
Hemoglobin: 11.8 g/dL — ABNORMAL LOW (ref 12.0–15.0)
Immature Granulocytes: 1 %
Lymphocytes Relative: 17 %
Lymphs Abs: 0.7 K/uL (ref 0.7–4.0)
MCH: 31.3 pg (ref 26.0–34.0)
MCHC: 34.3 g/dL (ref 30.0–36.0)
MCV: 91.2 fL (ref 80.0–100.0)
Monocytes Absolute: 0.6 K/uL (ref 0.1–1.0)
Monocytes Relative: 13 %
Neutro Abs: 2.7 K/uL (ref 1.7–7.7)
Neutrophils Relative %: 65 %
Platelet Count: 173 K/uL (ref 150–400)
RBC: 3.77 MIL/uL — ABNORMAL LOW (ref 3.87–5.11)
RDW: 13.4 % (ref 11.5–15.5)
WBC Count: 4.1 K/uL (ref 4.0–10.5)
nRBC: 0 % (ref 0.0–0.2)

## 2023-10-02 LAB — RETIC PANEL
Immature Retic Fract: 4.5 % (ref 2.3–15.9)
RBC.: 3.78 MIL/uL — ABNORMAL LOW (ref 3.87–5.11)
Retic Count, Absolute: 40.4 K/uL (ref 19.0–186.0)
Retic Ct Pct: 1.1 % (ref 0.4–3.1)
Reticulocyte Hemoglobin: 36.1 pg (ref >27.9)

## 2023-10-02 LAB — IRON AND IRON BINDING CAPACITY (CC-WL,HP ONLY)
Iron: 71 ug/dL (ref 28–170)
Saturation Ratios: 19 % (ref 10.4–31.8)
TIBC: 374 ug/dL (ref 250–450)
UIBC: 303 ug/dL

## 2023-10-02 LAB — LACTATE DEHYDROGENASE: LDH: 185 U/L (ref 98–192)

## 2023-10-02 NOTE — Progress Notes (Signed)
 Hematology and Oncology Follow Up Visit  [name] [id number] Mar 04, 1945 78 y.o. 10/02/2023   Principle Diagnosis:  Stage IIIA (T4N0M0) squamous cell carcinoma the right lower lung   Past Therapy: Patient completed definitive chemoradiation therapy with weekly carboplatinum /Taxol  - completed on 07/16/2019   Current Therapy:        Durvalumab  -- maintenance -- s/p cycle # 12 =--completed on 09/25/2020   Interim History:  [name] is here today for follow-up.  Unfortunately, she has had some trauma in the family.  Her husband passed away unexpectedly.  I am so sorry about this.  They are married 49 years.  I know this has been incredibly tough for her.  She, thankfully, is incredibly strong.  She has a good family that she can help rely on.  We did do a PET scan on her.  This was done on 09/19/2023.  The PET scan did seem to suggest that there may be the possibility of recurrence in the right lower lung.  There is some activity that was noted with SUV of 6.9.  Everything else looked fine.  She has had no cough.  Has been out of summer for her lungs because of the heat and humidity.  She has had no nausea or vomiting.  She has had no bleeding.  Has been no leg swelling.  She has had no rashes.  Overall, I would have say that her performance status is probably ECOG 1.    Medications:  Allergies as of 10/02/2023       Reactions   Bee Venom Swelling, Other (See Comments)   Brimonidine Tartrate Nausea Only   Timolol Nausea Only   Hydrocodone Bitartrate Er Itching, Rash   Brimonidine Nausea Only   Codeine Other (See Comments)   Unknown Reaction   Hydrocodone Itching, Rash, Other (See Comments)        Medication List        Accurate as of October 02, 2023  1:05 PM. If you have any questions, ask your nurse or doctor.          STOP taking these medications    prednisoLONE acetate 1 % ophthalmic suspension Commonly known as: PRED FORTE Stopped by: [name]       TAKE these medications    albuterol  108 (90 Base) MCG/ACT inhaler Commonly known as: VENTOLIN  HFA   amoxicillin  500 MG tablet Commonly known as: AMOXIL  Takes 2000 mg one hour before dental cleanings.   ASPIRIN  LOW DOSE 81 MG tablet Generic drug: aspirin  EC Take 81 mg by mouth daily.   atorvastatin  20 MG tablet Commonly known as: LIPITOR TAKE 1 TABLET(20 MG) BY MOUTH DAILY   Benefiber Powd Take by mouth daily. 3 scoops into water daily per patient   BOOST HIGH PROTEIN PO Take by mouth daily.   diphenhydrAMINE  25 MG tablet Commonly known as: BENADRYL  Take 50 mg by mouth daily as needed (allergic reaction).   dorzolamide  2 % ophthalmic solution Commonly known as: TRUSOPT  Place 1 drop into both eyes 2 (two) times daily.   famotidine  20 MG tablet Commonly known as: PEPCID  Take 20 mg by mouth at bedtime.   irbesartan  150 MG tablet Commonly known as: AVAPRO  Take 150 mg by mouth daily.   levothyroxine  88 MCG tablet Commonly known as: SYNTHROID  Take 88 mcg by mouth every morning.   lidocaine -prilocaine  cream Commonly known as: EMLA    pantoprazole  40 MG tablet Commonly known as: PROTONIX  Take 40 mg by mouth  2 (two) times daily.   polyethylene glycol powder 17 GM/SCOOP powder Commonly known as: GLYCOLAX/MIRALAX Take by mouth daily as needed for mild constipation.   Spiriva Respimat 2.5 MCG/ACT Aers Generic drug: Tiotropium Bromide Monohydrate Inhale 2 puffs into the lungs daily.   travoprost  (benzalkonium) 0.004 % ophthalmic solution Commonly known as: TRAVATAN  Place 1 drop into both eyes at bedtime.   Vitamin D  50 MCG (2000 UT) tablet Take 3,000 Units by mouth daily with supper.        Allergies:  Allergies  Allergen Reactions   Bee Venom Swelling and Other (See Comments)   Brimonidine Tartrate Nausea Only   Timolol Nausea Only   Hydrocodone Bitartrate Er Itching and Rash   Brimonidine Nausea Only   Codeine Other (See Comments)     Unknown Reaction   Hydrocodone Itching, Rash and Other (See Comments)    Past Medical History, Surgical history, Social history, and Family History were reviewed and updated.  Review of Systems: Review of Systems  Constitutional: Negative.   Eyes: Negative.   Respiratory:  Positive for cough.   Cardiovascular: Negative.   Gastrointestinal: Negative.   Genitourinary: Negative.   Musculoskeletal: Negative.   Skin: Negative.   Neurological: Negative.   Endo/Heme/Allergies: Negative.   Psychiatric/Behavioral: Negative.       Physical Exam:  height is 4' 11.5 (1.511 m) and weight is 105 lb (47.6 kg). Her oral temperature is 98.2 F (36.8 C). Her blood pressure is 139/62 and her pulse is 68. Her respiration is 16 and oxygen saturation is 100%.   Wt Readings from Last 3 Encounters:  10/02/23 105 lb (47.6 kg)  05/01/23 110 lb (49.9 kg)  01/02/23 107 lb 1.9 oz (48.6 kg)    Physical Exam Vitals reviewed.  HENT:     Head: Normocephalic and atraumatic.  Eyes:     Pupils: Pupils are equal, round, and reactive to light.  Cardiovascular:     Rate and Rhythm: Normal rate and regular rhythm.     Heart sounds: Normal heart sounds.  Pulmonary:     Effort: Pulmonary effort is normal.     Breath sounds: Normal breath sounds.  Abdominal:     General: Bowel sounds are normal.     Palpations: Abdomen is soft.  Musculoskeletal:        General: No tenderness or deformity. Normal range of motion.     Cervical back: Normal range of motion.  Lymphadenopathy:     Cervical: No cervical adenopathy.  Skin:    General: Skin is warm and dry.     Findings: No erythema or rash.  Neurological:     Mental Status: She is alert and oriented to person, place, and time.  Psychiatric:        Behavior: Behavior normal.        Thought Content: Thought content normal.        Judgment: Judgment normal.       Lab Results  Component Value Date   WBC 4.1 10/02/2023   HGB 11.8 (L) 10/02/2023   HCT  34.4 (L) 10/02/2023   MCV 91.2 10/02/2023   PLT 173 10/02/2023   Lab Results  Component Value Date   FERRITIN 74 05/01/2023   IRON 109 05/01/2023   TIBC 357 05/01/2023   UIBC 248 05/01/2023   IRONPCTSAT 31 05/01/2023   Lab Results  Component Value Date   RETICCTPCT 1.1 10/02/2023   RBC 3.78 (L) 10/02/2023   No results found for: KPAFRELGTCHN, LAMBDASER, KAPLAMBRATIO  No results found for: IGGSERUM, IGA, IGMSERUM No results found for: STEPHANY CARLOTA BENSON MARKEL EARLA JOANNIE, GAMS, MSPIKE, SPEI   Chemistry      Component Value Date/Time   NA 130 (L) 10/02/2023 1215   K 4.1 10/02/2023 1215   CL 95 (L) 10/02/2023 1215   CO2 24 10/02/2023 1215   BUN 13 10/02/2023 1215   CREATININE 0.50 10/02/2023 1215      Component Value Date/Time   CALCIUM  9.2 10/02/2023 1215   ALKPHOS 87 10/02/2023 1215   AST 13 (L) 10/02/2023 1215   ALT 17 10/02/2023 1215   BILITOT 0.6 10/02/2023 1215       Impression and Plan: Ms. Gubler is a very nice 78 yo caucasian female with locally advanced stage IIIa non-small cell carcinoma the right lower lung.      She received chemotherapy and radiation therapy with a very nice response.  We then went ahead and treated her with immunotherapy with Durvalumab .  For right now, we can have to get another PET scan on her.  I probably get was set up for about 6 weeks or so.  If there is any change with the SUV, they were probably going to have to think about doing a biopsy to prove recurrence.  I just feel so bad for her.  She really has a lot of stress.  We will definitely pray hard for her and her family.   Meghan JONELLE Crease, MD 8/21/20251:05 PM

## 2023-10-07 DIAGNOSIS — Z01419 Encounter for gynecological examination (general) (routine) without abnormal findings: Secondary | ICD-10-CM | POA: Diagnosis not present

## 2023-11-06 ENCOUNTER — Other Ambulatory Visit (HOSPITAL_BASED_OUTPATIENT_CLINIC_OR_DEPARTMENT_OTHER): Payer: Self-pay

## 2023-11-06 MED ORDER — FLUZONE HIGH-DOSE 0.5 ML IM SUSY
0.5000 mL | PREFILLED_SYRINGE | Freq: Once | INTRAMUSCULAR | 0 refills | Status: AC
Start: 2023-11-06 — End: 2023-11-07
  Filled 2023-11-06: qty 0.5, 1d supply, fill #0

## 2023-11-06 MED ORDER — COMIRNATY 30 MCG/0.3ML IM SUSY
0.3000 mL | PREFILLED_SYRINGE | Freq: Once | INTRAMUSCULAR | 0 refills | Status: AC
Start: 2023-11-06 — End: 2023-11-07
  Filled 2023-11-06: qty 0.3, 1d supply, fill #0

## 2023-11-17 ENCOUNTER — Encounter (HOSPITAL_COMMUNITY)
Admission: RE | Admit: 2023-11-17 | Discharge: 2023-11-17 | Disposition: A | Discharge status: Home / Self Care | Source: Ambulatory Visit | Attending: Hematology & Oncology | Admitting: Hematology & Oncology

## 2023-11-17 DIAGNOSIS — C3431 Malignant neoplasm of lower lobe, right bronchus or lung: Secondary | ICD-10-CM | POA: Diagnosis not present

## 2023-11-17 LAB — GLUCOSE, CAPILLARY: Glucose-Capillary: 83 mg/dL (ref 70–99)

## 2023-11-17 MED ORDER — HEPARIN SOD (PORK) LOCK FLUSH 100 UNIT/ML IV SOLN
500.0000 [IU] | Freq: Once | INTRAVENOUS | Status: AC
  Administered 2023-11-17: 500 [IU] via INTRAVENOUS
  Filled 2023-11-17: qty 5

## 2023-11-17 MED ORDER — FLUDEOXYGLUCOSE F - 18 (FDG) INJECTION
5.2000 | Freq: Once | INTRAVENOUS | Status: AC
  Administered 2023-11-17: 5.2 via INTRAVENOUS

## 2023-11-18 ENCOUNTER — Ambulatory Visit: Payer: Self-pay | Admitting: Hematology & Oncology

## 2023-11-27 ENCOUNTER — Inpatient Hospital Stay (HOSPITAL_BASED_OUTPATIENT_CLINIC_OR_DEPARTMENT_OTHER): Admitting: Hematology & Oncology

## 2023-11-27 ENCOUNTER — Inpatient Hospital Stay: Attending: Hematology & Oncology

## 2023-11-27 ENCOUNTER — Inpatient Hospital Stay

## 2023-11-27 ENCOUNTER — Encounter: Payer: Self-pay | Admitting: Hematology & Oncology

## 2023-11-27 VITALS — BP 138/49 | HR 64 | Temp 97.9°F | Resp 18 | Wt 104.0 lb

## 2023-11-27 DIAGNOSIS — C3431 Malignant neoplasm of lower lobe, right bronchus or lung: Secondary | ICD-10-CM

## 2023-11-27 DIAGNOSIS — Z9221 Personal history of antineoplastic chemotherapy: Secondary | ICD-10-CM | POA: Diagnosis not present

## 2023-11-27 DIAGNOSIS — Z87891 Personal history of nicotine dependence: Secondary | ICD-10-CM | POA: Insufficient documentation

## 2023-11-27 DIAGNOSIS — Z923 Personal history of irradiation: Secondary | ICD-10-CM | POA: Insufficient documentation

## 2023-11-27 LAB — CBC WITH DIFFERENTIAL (CANCER CENTER ONLY)
Abs Immature Granulocytes: 0.01 K/uL (ref 0.00–0.07)
Basophils Absolute: 0 K/uL (ref 0.0–0.1)
Basophils Relative: 1 %
Eosinophils Absolute: 0.1 K/uL (ref 0.0–0.5)
Eosinophils Relative: 3 %
HCT: 33.8 % — ABNORMAL LOW (ref 36.0–46.0)
Hemoglobin: 11.7 g/dL — ABNORMAL LOW (ref 12.0–15.0)
Immature Granulocytes: 0 %
Lymphocytes Relative: 17 %
Lymphs Abs: 0.7 K/uL (ref 0.7–4.0)
MCH: 31.9 pg (ref 26.0–34.0)
MCHC: 34.6 g/dL (ref 30.0–36.0)
MCV: 92.1 fL (ref 80.0–100.0)
Monocytes Absolute: 0.5 K/uL (ref 0.1–1.0)
Monocytes Relative: 12 %
Neutro Abs: 2.7 K/uL (ref 1.7–7.7)
Neutrophils Relative %: 67 %
Platelet Count: 190 K/uL (ref 150–400)
RBC: 3.67 MIL/uL — ABNORMAL LOW (ref 3.87–5.11)
RDW: 13.1 % (ref 11.5–15.5)
WBC Count: 4 K/uL (ref 4.0–10.5)
nRBC: 0 % (ref 0.0–0.2)

## 2023-11-27 LAB — CMP (CANCER CENTER ONLY)
ALT: 15 U/L (ref 0–44)
AST: 12 U/L — ABNORMAL LOW (ref 15–41)
Albumin: 4.4 g/dL (ref 3.5–5.0)
Alkaline Phosphatase: 74 U/L (ref 38–126)
Anion gap: 11 (ref 5–15)
BUN: 14 mg/dL (ref 8–23)
CO2: 23 mmol/L (ref 22–32)
Calcium: 9 mg/dL (ref 8.9–10.3)
Chloride: 97 mmol/L — ABNORMAL LOW (ref 98–111)
Creatinine: 0.52 mg/dL (ref 0.44–1.00)
GFR, Estimated: >60 mL/min (ref >60)
Glucose, Bld: 77 mg/dL (ref 70–99)
Potassium: 4.2 mmol/L (ref 3.5–5.1)
Sodium: 131 mmol/L — ABNORMAL LOW (ref 135–145)
Total Bilirubin: 0.5 mg/dL (ref 0.0–1.2)
Total Protein: 6.6 g/dL (ref 6.5–8.1)

## 2023-11-27 NOTE — Progress Notes (Signed)
 Hematology and Oncology Follow Up Visit  [name] [id number] Feb 11, 1946 78 y.o. 11/27/2023   Principle Diagnosis:  Stage IIIA (T4N0M0) squamous cell carcinoma the right lower lung   Past Therapy: Patient completed definitive chemoradiation therapy with weekly carboplatinum /Taxol  - completed on 07/16/2019   Current Therapy:        Durvalumab  -- maintenance -- s/p cycle # 12 =--completed on 09/25/2020   Interim History:  [name] is here today for follow-up.  Thankfully, everything is going well now.  We had another PET scan on her.  The PET scan thankfully did not show any evidence of any growth in the right lower lung.  There was some changes that were felt to be more inflammatory than anything else.  I feel very good about this.  She feels very good about this.  For right now, we will then plan to get her back onto her regular schedule.  She feels okay.  The cooler drier weather is definitely helping her.  She has underlying COPD.  The hot humid air definitely caused her difficulties.  Her appetite is doing well.  She has had no problems with nausea or vomiting.  There is no change in bowel or bladder habits.  She has had no leg swelling.  She has had no rashes.  She has had no headache.  She has had no mouth sores.  She has had no dysphagia or odynophagia.  Overall, I will say that her performance status is probably ECOG 1.    Medications:  Allergies as of 11/27/2023       Reactions   Bee Venom Swelling, Other (See Comments)   Brimonidine Tartrate Nausea Only   Timolol Nausea Only   Hydrocodone Bitartrate Er Itching, Rash   Brimonidine Nausea Only   Codeine Other (See Comments)   Unknown Reaction   Hydrocodone Itching, Rash, Other (See Comments)        Medication List        Accurate as of November 27, 2023  1:08 PM. If you have any questions, ask your nurse or doctor.          STOP taking these medications    famotidine  20 MG tablet Commonly  known as: PEPCID  Stopped by: [name]       TAKE these medications    albuterol  108 (90 Base) MCG/ACT inhaler Commonly known as: VENTOLIN  HFA   amoxicillin  500 MG tablet Commonly known as: AMOXIL  Takes 2000 mg one hour before dental cleanings.   ASPIRIN  LOW DOSE 81 MG tablet Generic drug: aspirin  EC Take 81 mg by mouth daily.   atorvastatin  20 MG tablet Commonly known as: LIPITOR TAKE 1 TABLET(20 MG) BY MOUTH DAILY   Benefiber Powd Take by mouth daily. 3 scoops into water daily per patient   BOOST HIGH PROTEIN PO Take by mouth daily.   diphenhydrAMINE  25 MG tablet Commonly known as: BENADRYL  Take 50 mg by mouth daily as needed (allergic reaction).   dorzolamide  2 % ophthalmic solution Commonly known as: TRUSOPT  Place 1 drop into both eyes 2 (two) times daily.   irbesartan  150 MG tablet Commonly known as: AVAPRO  Take 150 mg by mouth daily.   levothyroxine  88 MCG tablet Commonly known as: SYNTHROID  Take 88 mcg by mouth every morning.   lidocaine -prilocaine  cream Commonly known as: EMLA    pantoprazole  40 MG tablet Commonly known as: PROTONIX  Take 40 mg by mouth 2 (two) times daily.   polyethylene glycol powder 17 GM/SCOOP powder Commonly  known as: GLYCOLAX/MIRALAX Take by mouth daily as needed for mild constipation.   Spiriva Respimat 2.5 MCG/ACT Aers Generic drug: Tiotropium Bromide Inhale 2 puffs into the lungs daily.   travoprost  (benzalkonium) 0.004 % ophthalmic solution Commonly known as: TRAVATAN  Place 1 drop into both eyes at bedtime.   Vitamin D  50 MCG (2000 UT) tablet Take 3,000 Units by mouth daily with supper.        Allergies:  Allergies  Allergen Reactions   Bee Venom Swelling and Other (See Comments)   Brimonidine Tartrate Nausea Only   Timolol Nausea Only   Hydrocodone Bitartrate Er Itching and Rash   Brimonidine Nausea Only   Codeine Other (See Comments)    Unknown Reaction   Hydrocodone Itching, Rash and Other (See  Comments)    Past Medical History, Surgical history, Social history, and Family History were reviewed and updated.  Review of Systems: Review of Systems  Constitutional: Negative.   Eyes: Negative.   Respiratory:  Positive for cough.   Cardiovascular: Negative.   Gastrointestinal: Negative.   Genitourinary: Negative.   Musculoskeletal: Negative.   Skin: Negative.   Neurological: Negative.   Endo/Heme/Allergies: Negative.   Psychiatric/Behavioral: Negative.       Physical Exam:  weight is 104 lb (47.2 kg). Her oral temperature is 97.9 F (36.6 C). Her blood pressure is 138/49 (abnormal) and her pulse is 64. Her respiration is 18 and oxygen saturation is 85% (abnormal).   Wt Readings from Last 3 Encounters:  11/27/23 104 lb (47.2 kg)  10/02/23 105 lb (47.6 kg)  05/01/23 110 lb (49.9 kg)    Physical Exam Vitals reviewed.  HENT:     Head: Normocephalic and atraumatic.  Eyes:     Pupils: Pupils are equal, round, and reactive to light.  Cardiovascular:     Rate and Rhythm: Normal rate and regular rhythm.     Heart sounds: Normal heart sounds.  Pulmonary:     Effort: Pulmonary effort is normal.     Breath sounds: Normal breath sounds.  Abdominal:     General: Bowel sounds are normal.     Palpations: Abdomen is soft.  Musculoskeletal:        General: No tenderness or deformity. Normal range of motion.     Cervical back: Normal range of motion.  Lymphadenopathy:     Cervical: No cervical adenopathy.  Skin:    General: Skin is warm and dry.     Findings: No erythema or rash.  Neurological:     Mental Status: She is alert and oriented to person, place, and time.  Psychiatric:        Behavior: Behavior normal.        Thought Content: Thought content normal.        Judgment: Judgment normal.       Lab Results  Component Value Date   WBC 4.0 11/27/2023   HGB 11.7 (L) 11/27/2023   HCT 33.8 (L) 11/27/2023   MCV 92.1 11/27/2023   PLT 190 11/27/2023   Lab Results   Component Value Date   FERRITIN 122 10/02/2023   IRON 71 10/02/2023   TIBC 374 10/02/2023   UIBC 303 10/02/2023   IRONPCTSAT 19 10/02/2023   Lab Results  Component Value Date   RETICCTPCT 1.1 10/02/2023   RBC 3.67 (L) 11/27/2023   No results found for: KPAFRELGTCHN, LAMBDASER, KAPLAMBRATIO No results found for: IGGSERUM, IGA, IGMSERUM No results found for: TOTALPROTELP, ALBUMINELP, A1GS, A2GS, BETS, BETA2SER, GAMS, MSPIKE, SPEI  Chemistry      Component Value Date/Time   NA 131 (L) 11/27/2023 1225   K 4.2 11/27/2023 1225   CL 97 (L) 11/27/2023 1225   CO2 23 11/27/2023 1225   BUN 14 11/27/2023 1225   CREATININE 0.52 11/27/2023 1225      Component Value Date/Time   CALCIUM  9.0 11/27/2023 1225   ALKPHOS 74 11/27/2023 1225   AST 12 (L) 11/27/2023 1225   ALT 15 11/27/2023 1225   BILITOT 0.5 11/27/2023 1225       Impression and Plan: Ms. Hillenburg is a very nice 78 yo caucasian female with locally advanced stage IIIa non-small cell carcinoma the right lower lung.      She received chemotherapy and radiation therapy with a very nice response.  We then went ahead and treated her with immunotherapy with Durvalumab .  At this point, we can get her PET scan in 3 months now.  We can get her through the Holiday season.  I know that she will be quite busy with her family.  I know that we still need to be very cautious.  She always have that risk of recurrence because of the locally advanced nature of her cancer..  At least, she will have confidence going into the Holiday season knowing that everything looks good.   Meghan JONELLE Crease, MD 10/16/20251:08 PM

## 2023-11-27 NOTE — Patient Instructions (Signed)

## 2023-12-02 DIAGNOSIS — E039 Hypothyroidism, unspecified: Secondary | ICD-10-CM | POA: Diagnosis not present

## 2023-12-02 DIAGNOSIS — I700 Atherosclerosis of aorta: Secondary | ICD-10-CM | POA: Diagnosis not present

## 2023-12-02 DIAGNOSIS — E559 Vitamin D deficiency, unspecified: Secondary | ICD-10-CM | POA: Diagnosis not present

## 2023-12-02 DIAGNOSIS — I10 Essential (primary) hypertension: Secondary | ICD-10-CM | POA: Diagnosis not present

## 2023-12-11 DIAGNOSIS — E039 Hypothyroidism, unspecified: Secondary | ICD-10-CM | POA: Diagnosis not present

## 2023-12-11 DIAGNOSIS — Z23 Encounter for immunization: Secondary | ICD-10-CM | POA: Diagnosis not present

## 2023-12-11 DIAGNOSIS — J439 Emphysema, unspecified: Secondary | ICD-10-CM | POA: Diagnosis not present

## 2023-12-11 DIAGNOSIS — Z0000 Encounter for general adult medical examination without abnormal findings: Secondary | ICD-10-CM | POA: Diagnosis not present

## 2023-12-11 DIAGNOSIS — I700 Atherosclerosis of aorta: Secondary | ICD-10-CM | POA: Diagnosis not present

## 2023-12-11 DIAGNOSIS — I10 Essential (primary) hypertension: Secondary | ICD-10-CM | POA: Diagnosis not present

## 2024-01-12 ENCOUNTER — Inpatient Hospital Stay: Attending: Hematology

## 2024-01-12 DIAGNOSIS — Z452 Encounter for adjustment and management of vascular access device: Secondary | ICD-10-CM | POA: Insufficient documentation

## 2024-01-12 DIAGNOSIS — C3431 Malignant neoplasm of lower lobe, right bronchus or lung: Secondary | ICD-10-CM | POA: Diagnosis present

## 2024-01-12 DIAGNOSIS — Z87891 Personal history of nicotine dependence: Secondary | ICD-10-CM | POA: Diagnosis not present

## 2024-01-12 NOTE — Patient Instructions (Signed)

## 2024-01-29 DIAGNOSIS — H35371 Puckering of macula, right eye: Secondary | ICD-10-CM | POA: Diagnosis not present

## 2024-01-29 DIAGNOSIS — H26491 Other secondary cataract, right eye: Secondary | ICD-10-CM | POA: Diagnosis not present

## 2024-01-29 DIAGNOSIS — H04123 Dry eye syndrome of bilateral lacrimal glands: Secondary | ICD-10-CM | POA: Diagnosis not present

## 2024-01-29 DIAGNOSIS — H401131 Primary open-angle glaucoma, bilateral, mild stage: Secondary | ICD-10-CM | POA: Diagnosis not present

## 2024-01-30 DIAGNOSIS — K219 Gastro-esophageal reflux disease without esophagitis: Secondary | ICD-10-CM | POA: Diagnosis not present

## 2024-01-30 DIAGNOSIS — R140 Abdominal distension (gaseous): Secondary | ICD-10-CM | POA: Diagnosis not present

## 2024-01-30 DIAGNOSIS — K5909 Other constipation: Secondary | ICD-10-CM | POA: Diagnosis not present

## 2024-01-30 DIAGNOSIS — Z8711 Personal history of peptic ulcer disease: Secondary | ICD-10-CM | POA: Diagnosis not present

## 2024-02-27 ENCOUNTER — Encounter (HOSPITAL_COMMUNITY)
Admission: RE | Admit: 2024-02-27 | Discharge: 2024-02-27 | Disposition: A | Discharge status: Home / Self Care | Source: Ambulatory Visit | Attending: Hematology & Oncology | Admitting: Hematology & Oncology

## 2024-02-27 DIAGNOSIS — C3431 Malignant neoplasm of lower lobe, right bronchus or lung: Secondary | ICD-10-CM | POA: Diagnosis present

## 2024-02-27 LAB — GLUCOSE, CAPILLARY: Glucose-Capillary: 91 mg/dL (ref 70–99)

## 2024-02-27 MED ORDER — FLUDEOXYGLUCOSE F - 18 (FDG) INJECTION
5.0000 | Freq: Once | INTRAVENOUS | Status: AC
  Administered 2024-02-27: 5.2 via INTRAVENOUS

## 2024-03-03 ENCOUNTER — Inpatient Hospital Stay: Attending: Hematology

## 2024-03-03 ENCOUNTER — Encounter: Payer: Self-pay | Admitting: Hematology & Oncology

## 2024-03-03 ENCOUNTER — Inpatient Hospital Stay

## 2024-03-03 ENCOUNTER — Inpatient Hospital Stay: Admitting: Hematology & Oncology

## 2024-03-03 VITALS — BP 132/52 | HR 75 | Temp 97.9°F | Resp 20 | Wt 106.0 lb

## 2024-03-03 DIAGNOSIS — R911 Solitary pulmonary nodule: Secondary | ICD-10-CM | POA: Insufficient documentation

## 2024-03-03 DIAGNOSIS — C3431 Malignant neoplasm of lower lobe, right bronchus or lung: Secondary | ICD-10-CM

## 2024-03-03 DIAGNOSIS — Z87891 Personal history of nicotine dependence: Secondary | ICD-10-CM | POA: Insufficient documentation

## 2024-03-03 LAB — CBC WITH DIFFERENTIAL (CANCER CENTER ONLY)
Abs Immature Granulocytes: 0 K/uL (ref 0.00–0.07)
Band Neutrophils: 0 %
Basophils Absolute: 0 K/uL (ref 0.0–0.1)
Basophils Relative: 1 %
Eosinophils Absolute: 0.1 K/uL (ref 0.0–0.5)
Eosinophils Relative: 3 %
HCT: 35.1 % — ABNORMAL LOW (ref 36.0–46.0)
Hemoglobin: 11.9 g/dL — ABNORMAL LOW (ref 12.0–15.0)
Immature Granulocytes: 0 %
Lymphocytes Relative: 19 %
Lymphs Abs: 0.8 K/uL (ref 0.7–4.0)
MCH: 31.4 pg (ref 26.0–34.0)
MCHC: 33.9 g/dL (ref 30.0–36.0)
MCV: 92.6 fL (ref 80.0–100.0)
Monocytes Absolute: 0.6 K/uL (ref 0.1–1.0)
Monocytes Relative: 14 %
Neutro Abs: 2.7 K/uL (ref 1.7–7.7)
Neutrophils Relative %: 64 %
Platelet Count: 189 K/uL (ref 150–400)
RBC: 3.79 MIL/uL — ABNORMAL LOW (ref 3.87–5.11)
RDW: 13.2 % (ref 11.5–15.5)
WBC Count: 4.3 K/uL (ref 4.0–10.5)
nRBC: 0 % (ref 0.0–0.2)
nRBC: 0 /100{WBCs}

## 2024-03-03 LAB — CMP (CANCER CENTER ONLY)
ALT: 15 U/L (ref 0–44)
AST: 12 U/L — ABNORMAL LOW (ref 15–41)
Albumin: 4.3 g/dL (ref 3.5–5.0)
Alkaline Phosphatase: 73 U/L (ref 38–126)
Anion gap: 10 (ref 5–15)
BUN: 13 mg/dL (ref 8–23)
CO2: 24 mmol/L (ref 22–32)
Calcium: 9.4 mg/dL (ref 8.9–10.3)
Chloride: 97 mmol/L — ABNORMAL LOW (ref 98–111)
Creatinine: 0.5 mg/dL (ref 0.44–1.00)
GFR, Estimated: >60 mL/min
Glucose, Bld: 76 mg/dL (ref 70–99)
Potassium: 4.3 mmol/L (ref 3.5–5.1)
Sodium: 131 mmol/L — ABNORMAL LOW (ref 135–145)
Total Bilirubin: 0.6 mg/dL (ref 0.0–1.2)
Total Protein: 6.8 g/dL (ref 6.5–8.1)

## 2024-03-03 LAB — LACTATE DEHYDROGENASE: LDH: 164 U/L (ref 105–235)

## 2024-03-03 NOTE — Progress Notes (Signed)
 " Hematology and Oncology Follow Up Visit  [name] [id number] 1945/04/25 79 y.o. 03/03/2024   Principle Diagnosis:  Stage IIIA (T4N0M0) squamous cell carcinoma the right lower lung   Past Therapy: Patient completed definitive chemoradiation therapy with weekly carboplatinum /Taxol  - completed on 07/16/2019   Current Therapy:        Durvalumab  -- maintenance -- s/p cycle # 12 =--completed on 09/25/2020   Interim History:  [name] is here today for follow-up.  She got through the holiday season.  She had a good time over the holidays.  She was with her family.  The cool air certainly helps her lungs.  She does have underlying COPD.  We did do a PET scan on her.  This was done on 02/27/2024.  This did show that there is a nodule in the left lower lobe measuring 1.2 x 0.7 cm.  There is no hypermetabolic activity associated with this.  As such, we will have to watch this closely.  If we think this is going to be a problem, we may want to consider radiosurgery.  She has a little bit of a cough.  This has been more chronic.SABRA  She has had no problems with bowels or bladder.  She has had no bleeding.  She has had no leg swelling.  She has had no rashes.  Thankfully, she has avoided COVID and Influenza.  Overall, I would say that her performance status is probably ECOG 1.    Medications:  Allergies as of 03/03/2024       Reactions   Bee Venom Swelling, Other (See Comments)   Brimonidine Tartrate Nausea Only   Timolol Nausea Only   Hydrocodone Bitartrate Er Itching, Rash   Brimonidine Nausea Only   Codeine Other (See Comments)   Unknown Reaction   Hydrocodone Itching, Rash, Other (See Comments)        Medication List        Accurate as of March 03, 2024  1:16 PM. If you have any questions, ask your nurse or doctor.          albuterol  108 (90 Base) MCG/ACT inhaler Commonly known as: VENTOLIN  HFA   amoxicillin  500 MG tablet Commonly known as: AMOXIL  Takes 2000  mg one hour before dental cleanings.   ASPIRIN  LOW DOSE 81 MG tablet Generic drug: aspirin  EC Take 81 mg by mouth daily.   atorvastatin  20 MG tablet Commonly known as: LIPITOR TAKE 1 TABLET(20 MG) BY MOUTH DAILY   Benefiber Powd Take by mouth daily. 3 scoops into water daily per patient   BOOST HIGH PROTEIN PO Take by mouth daily.   diphenhydrAMINE  25 MG tablet Commonly known as: BENADRYL  Take 50 mg by mouth daily as needed (allergic reaction).   dorzolamide  2 % ophthalmic solution Commonly known as: TRUSOPT  Place 1 drop into both eyes 2 (two) times daily.   irbesartan  150 MG tablet Commonly known as: AVAPRO  Take 150 mg by mouth daily.   levothyroxine  88 MCG tablet Commonly known as: SYNTHROID  Take 88 mcg by mouth every morning.   lidocaine -prilocaine  cream Commonly known as: EMLA    pantoprazole  40 MG tablet Commonly known as: PROTONIX  Take 40 mg by mouth 2 (two) times daily. What changed: when to take this   polyethylene glycol powder 17 GM/SCOOP powder Commonly known as: GLYCOLAX/MIRALAX Take by mouth daily as needed for mild constipation.   Spiriva Respimat 2.5 MCG/ACT Aers Generic drug: Tiotropium Bromide Inhale 2 puffs into the lungs daily.  travoprost  (benzalkonium) 0.004 % ophthalmic solution Commonly known as: TRAVATAN  Place 1 drop into both eyes at bedtime.   Vitamin D  50 MCG (2000 UT) tablet Take 3,000 Units by mouth daily with supper.        Allergies:  Allergies  Allergen Reactions   Bee Venom Swelling and Other (See Comments)   Brimonidine Tartrate Nausea Only   Timolol Nausea Only   Hydrocodone Bitartrate Er Itching and Rash   Brimonidine Nausea Only   Codeine Other (See Comments)    Unknown Reaction   Hydrocodone Itching, Rash and Other (See Comments)    Past Medical History, Surgical history, Social history, and Family History were reviewed and updated.  Review of Systems: Review of Systems  Constitutional: Negative.    Eyes: Negative.   Respiratory:  Positive for cough.   Cardiovascular: Negative.   Gastrointestinal: Negative.   Genitourinary: Negative.   Musculoskeletal: Negative.   Skin: Negative.   Neurological: Negative.   Endo/Heme/Allergies: Negative.   Psychiatric/Behavioral: Negative.       Physical Exam:  weight is 106 lb (48.1 kg). Her oral temperature is 97.9 F (36.6 C). Her blood pressure is 132/52 (abnormal) and her pulse is 75. Her respiration is 20 and oxygen saturation is 100%.   Wt Readings from Last 3 Encounters:  03/03/24 106 lb (48.1 kg)  01/12/24 106 lb (48.1 kg)  11/27/23 104 lb (47.2 kg)    Physical Exam Vitals reviewed.  HENT:     Head: Normocephalic and atraumatic.  Eyes:     Pupils: Pupils are equal, round, and reactive to light.  Cardiovascular:     Rate and Rhythm: Normal rate and regular rhythm.     Heart sounds: Normal heart sounds.  Pulmonary:     Effort: Pulmonary effort is normal.     Breath sounds: Normal breath sounds.  Abdominal:     General: Bowel sounds are normal.     Palpations: Abdomen is soft.  Musculoskeletal:        General: No tenderness or deformity. Normal range of motion.     Cervical back: Normal range of motion.  Lymphadenopathy:     Cervical: No cervical adenopathy.  Skin:    General: Skin is warm and dry.     Findings: No erythema or rash.  Neurological:     Mental Status: She is alert and oriented to person, place, and time.  Psychiatric:        Behavior: Behavior normal.        Thought Content: Thought content normal.        Judgment: Judgment normal.       Lab Results  Component Value Date   WBC 4.0 11/27/2023   HGB 11.7 (L) 11/27/2023   HCT 33.8 (L) 11/27/2023   MCV 92.1 11/27/2023   PLT 190 11/27/2023   Lab Results  Component Value Date   FERRITIN 122 10/02/2023   IRON 71 10/02/2023   TIBC 374 10/02/2023   UIBC 303 10/02/2023   IRONPCTSAT 19 10/02/2023   Lab Results  Component Value Date    RETICCTPCT 1.1 10/02/2023   RBC 3.67 (L) 11/27/2023   No results found for: KPAFRELGTCHN, LAMBDASER, KAPLAMBRATIO No results found for: IGGSERUM, IGA, IGMSERUM No results found for: TOTALPROTELP, ALBUMINELP, A1GS, A2GS, BETS, BETA2SER, GAMS, MSPIKE, SPEI   Chemistry      Component Value Date/Time   NA 131 (L) 11/27/2023 1225   K 4.2 11/27/2023 1225   CL 97 (L) 11/27/2023 1225   CO2  23 11/27/2023 1225   BUN 14 11/27/2023 1225   CREATININE 0.52 11/27/2023 1225      Component Value Date/Time   CALCIUM  9.0 11/27/2023 1225   ALKPHOS 74 11/27/2023 1225   AST 12 (L) 11/27/2023 1225   ALT 15 11/27/2023 1225   BILITOT 0.5 11/27/2023 1225       Impression and Plan: [name] is a very nice 79 yo caucasian female with locally advanced stage IIIa non-small cell carcinoma the right lower lung.      She received chemotherapy and radiation therapy with a very nice response.  We then went ahead and treated her with immunotherapy with Durvalumab .  I am not sure what is going on with his nodule in the left lower lung.  Again we will have to watch this closely.  We probably will have to do another PET scan on her sometime in March.  Again, if there are any issues with this nodule, then I will consider stereotactic radiosurgery.  We will plan to get her back after she has the PET scan done.   [name], MD 1/21/20261:16 PM  "

## 2024-04-28 ENCOUNTER — Inpatient Hospital Stay: Admitting: Hematology & Oncology

## 2024-04-28 ENCOUNTER — Inpatient Hospital Stay

## 2024-04-28 ENCOUNTER — Inpatient Hospital Stay: Attending: Hematology
